# Patient Record
Sex: Male | Born: 1935 | Race: Black or African American | Hispanic: No | Marital: Married | Smoking: Former smoker
Health system: Southern US, Community
[De-identification: ages and names within clinical notes are randomized; demographics above are authoritative.]

## PROBLEM LIST (undated history)

## (undated) DIAGNOSIS — D696 Thrombocytopenia, unspecified: Secondary | ICD-10-CM

## (undated) DIAGNOSIS — M1990 Unspecified osteoarthritis, unspecified site: Secondary | ICD-10-CM

## (undated) DIAGNOSIS — I739 Peripheral vascular disease, unspecified: Secondary | ICD-10-CM

## (undated) DIAGNOSIS — I499 Cardiac arrhythmia, unspecified: Secondary | ICD-10-CM

## (undated) DIAGNOSIS — I10 Essential (primary) hypertension: Secondary | ICD-10-CM

## (undated) DIAGNOSIS — E785 Hyperlipidemia, unspecified: Secondary | ICD-10-CM

## (undated) DIAGNOSIS — I4891 Unspecified atrial fibrillation: Secondary | ICD-10-CM

## (undated) DIAGNOSIS — R5383 Other fatigue: Secondary | ICD-10-CM

## (undated) DIAGNOSIS — D649 Anemia, unspecified: Secondary | ICD-10-CM

## (undated) HISTORY — DX: Peripheral vascular disease, unspecified: I73.9

## (undated) HISTORY — DX: Unspecified atrial fibrillation: I48.91

## (undated) HISTORY — DX: Anemia, unspecified: D64.9

## (undated) HISTORY — PX: COLONOSCOPY: SHX174

## (undated) HISTORY — DX: Thrombocytopenia, unspecified: D69.6

## (undated) HISTORY — PX: NECK SURGERY: SHX720

## (undated) HISTORY — DX: Hyperlipidemia, unspecified: E78.5

## (undated) HISTORY — DX: Unspecified osteoarthritis, unspecified site: M19.90

## (undated) HISTORY — DX: Other fatigue: R53.83

## (undated) HISTORY — PX: BREAST SURGERY: SHX581

---

## 1990-07-23 HISTORY — PX: PROSTATECTOMY: SHX69

## 1997-10-27 ENCOUNTER — Ambulatory Visit (HOSPITAL_COMMUNITY): Admission: RE | Admit: 1997-10-27 | Discharge: 1997-10-27 | Payer: Self-pay | Admitting: Neurosurgery

## 1998-01-05 ENCOUNTER — Emergency Department (HOSPITAL_COMMUNITY): Admission: EM | Admit: 1998-01-05 | Discharge: 1998-01-05 | Payer: Self-pay | Admitting: Emergency Medicine

## 1998-02-21 ENCOUNTER — Ambulatory Visit (HOSPITAL_COMMUNITY): Admission: RE | Admit: 1998-02-21 | Discharge: 1998-02-21 | Payer: Self-pay | Admitting: Urology

## 1998-03-09 ENCOUNTER — Ambulatory Visit (HOSPITAL_COMMUNITY): Admission: RE | Admit: 1998-03-09 | Discharge: 1998-03-09 | Payer: Self-pay | Admitting: Neurosurgery

## 1998-08-22 ENCOUNTER — Emergency Department (HOSPITAL_COMMUNITY): Admission: EM | Admit: 1998-08-22 | Discharge: 1998-08-22 | Payer: Self-pay | Admitting: Emergency Medicine

## 1998-09-21 ENCOUNTER — Encounter: Payer: Self-pay | Admitting: Neurosurgery

## 1998-09-22 ENCOUNTER — Encounter: Payer: Self-pay | Admitting: Neurosurgery

## 1998-09-22 ENCOUNTER — Inpatient Hospital Stay (HOSPITAL_COMMUNITY): Admission: RE | Admit: 1998-09-22 | Discharge: 1998-09-23 | Payer: Self-pay | Admitting: Neurosurgery

## 1998-10-15 ENCOUNTER — Ambulatory Visit (HOSPITAL_COMMUNITY): Admission: RE | Admit: 1998-10-15 | Discharge: 1998-10-15 | Payer: Self-pay | Admitting: Neurosurgery

## 1998-10-15 ENCOUNTER — Encounter: Payer: Self-pay | Admitting: Neurosurgery

## 1998-11-07 ENCOUNTER — Encounter: Payer: Self-pay | Admitting: Neurosurgery

## 1998-11-07 ENCOUNTER — Ambulatory Visit (HOSPITAL_COMMUNITY): Admission: RE | Admit: 1998-11-07 | Discharge: 1998-11-07 | Payer: Self-pay | Admitting: Neurosurgery

## 1999-01-17 ENCOUNTER — Encounter
Admission: RE | Admit: 1999-01-17 | Discharge: 1999-03-09 | Payer: Self-pay | Admitting: Physical Medicine & Rehabilitation

## 1999-06-02 ENCOUNTER — Encounter (INDEPENDENT_AMBULATORY_CARE_PROVIDER_SITE_OTHER): Payer: Self-pay | Admitting: Specialist

## 1999-06-02 ENCOUNTER — Ambulatory Visit (HOSPITAL_COMMUNITY): Admission: RE | Admit: 1999-06-02 | Discharge: 1999-06-02 | Payer: Self-pay | Admitting: Gastroenterology

## 2000-04-18 ENCOUNTER — Encounter: Payer: Self-pay | Admitting: Urology

## 2000-04-18 ENCOUNTER — Ambulatory Visit (HOSPITAL_COMMUNITY): Admission: RE | Admit: 2000-04-18 | Discharge: 2000-04-18 | Payer: Self-pay | Admitting: Urology

## 2000-09-19 ENCOUNTER — Encounter: Payer: Self-pay | Admitting: Emergency Medicine

## 2000-09-19 ENCOUNTER — Inpatient Hospital Stay (HOSPITAL_COMMUNITY): Admission: EM | Admit: 2000-09-19 | Discharge: 2000-09-21 | Payer: Self-pay | Admitting: Emergency Medicine

## 2000-12-18 ENCOUNTER — Ambulatory Visit (HOSPITAL_COMMUNITY): Admission: RE | Admit: 2000-12-18 | Discharge: 2000-12-18 | Payer: Self-pay | Admitting: *Deleted

## 2001-05-22 ENCOUNTER — Encounter: Payer: Self-pay | Admitting: Orthopaedic Surgery

## 2001-05-29 ENCOUNTER — Inpatient Hospital Stay (HOSPITAL_COMMUNITY): Admission: RE | Admit: 2001-05-29 | Discharge: 2001-05-31 | Payer: Self-pay | Admitting: Orthopaedic Surgery

## 2001-05-29 ENCOUNTER — Encounter: Payer: Self-pay | Admitting: Orthopaedic Surgery

## 2001-05-30 ENCOUNTER — Encounter: Payer: Self-pay | Admitting: Orthopaedic Surgery

## 2001-05-31 ENCOUNTER — Encounter: Payer: Self-pay | Admitting: Internal Medicine

## 2001-09-05 ENCOUNTER — Encounter: Admission: RE | Admit: 2001-09-05 | Discharge: 2001-09-05 | Payer: Self-pay | Admitting: Orthopaedic Surgery

## 2001-09-05 ENCOUNTER — Encounter: Payer: Self-pay | Admitting: Orthopaedic Surgery

## 2003-09-23 ENCOUNTER — Encounter: Admission: RE | Admit: 2003-09-23 | Discharge: 2003-09-23 | Payer: Self-pay | Admitting: Internal Medicine

## 2003-11-25 ENCOUNTER — Emergency Department (HOSPITAL_COMMUNITY): Admission: EM | Admit: 2003-11-25 | Discharge: 2003-11-25 | Payer: Self-pay | Admitting: Family Medicine

## 2004-06-27 ENCOUNTER — Encounter: Admission: RE | Admit: 2004-06-27 | Discharge: 2004-06-27 | Payer: Self-pay | Admitting: Internal Medicine

## 2005-12-25 ENCOUNTER — Ambulatory Visit (HOSPITAL_COMMUNITY): Admission: RE | Admit: 2005-12-25 | Discharge: 2005-12-25 | Payer: Self-pay | Admitting: *Deleted

## 2007-01-08 ENCOUNTER — Encounter: Admission: RE | Admit: 2007-01-08 | Discharge: 2007-01-08 | Payer: Self-pay | Admitting: Internal Medicine

## 2010-11-03 ENCOUNTER — Encounter (HOSPITAL_COMMUNITY)
Admission: RE | Admit: 2010-11-03 | Discharge: 2010-11-03 | Disposition: A | Discharge status: Home / Self Care | Payer: Medicare HMO | Source: Ambulatory Visit | Attending: Neurosurgery | Admitting: Neurosurgery

## 2010-11-03 LAB — CBC
MCH: 32 pg (ref 26.0–34.0)
MCHC: 32.9 g/dL (ref 30.0–36.0)
Platelets: 75 10*3/uL — ABNORMAL LOW (ref 150–400)
RBC: 4.47 MIL/uL (ref 4.22–5.81)

## 2010-11-03 LAB — BASIC METABOLIC PANEL
Calcium: 9.3 mg/dL (ref 8.4–10.5)
Chloride: 106 mEq/L (ref 96–112)
Creatinine, Ser: 1.27 mg/dL (ref 0.4–1.5)
GFR calc Af Amer: >60 mL/min (ref >60)
Sodium: 140 mEq/L (ref 135–145)

## 2010-11-03 LAB — SURGICAL PCR SCREEN
MRSA, PCR: NEGATIVE
Staphylococcus aureus: NEGATIVE

## 2010-11-09 ENCOUNTER — Inpatient Hospital Stay (HOSPITAL_COMMUNITY): Admission: RE | Admit: 2010-11-09 | Payer: Medicare HMO | Source: Ambulatory Visit | Admitting: Neurosurgery

## 2010-11-13 HISTORY — PX: OTHER SURGICAL HISTORY: SHX169

## 2010-11-16 ENCOUNTER — Ambulatory Visit (HOSPITAL_COMMUNITY): Payer: Medicare HMO

## 2010-11-16 ENCOUNTER — Encounter (HOSPITAL_COMMUNITY): Payer: Self-pay | Admitting: Radiology

## 2010-11-16 ENCOUNTER — Ambulatory Visit (HOSPITAL_COMMUNITY)
Admission: RE | Admit: 2010-11-16 | Discharge: 2010-11-16 | Disposition: A | Discharge status: Home / Self Care | Payer: Medicare HMO | Source: Ambulatory Visit | Attending: Internal Medicine | Admitting: Internal Medicine

## 2010-11-16 DIAGNOSIS — J189 Pneumonia, unspecified organism: Secondary | ICD-10-CM

## 2010-11-16 DIAGNOSIS — Z01812 Encounter for preprocedural laboratory examination: Secondary | ICD-10-CM | POA: Insufficient documentation

## 2010-11-16 DIAGNOSIS — I10 Essential (primary) hypertension: Secondary | ICD-10-CM | POA: Insufficient documentation

## 2010-11-16 DIAGNOSIS — I4891 Unspecified atrial fibrillation: Secondary | ICD-10-CM | POA: Insufficient documentation

## 2010-11-16 DIAGNOSIS — J90 Pleural effusion, not elsewhere classified: Secondary | ICD-10-CM | POA: Insufficient documentation

## 2010-11-16 DIAGNOSIS — K7689 Other specified diseases of liver: Secondary | ICD-10-CM | POA: Insufficient documentation

## 2010-11-16 DIAGNOSIS — J479 Bronchiectasis, uncomplicated: Secondary | ICD-10-CM | POA: Insufficient documentation

## 2010-11-16 DIAGNOSIS — Z01810 Encounter for preprocedural cardiovascular examination: Secondary | ICD-10-CM | POA: Insufficient documentation

## 2010-11-16 HISTORY — DX: Essential (primary) hypertension: I10

## 2010-11-16 MED ORDER — IOHEXOL 300 MG/ML  SOLN
80.0000 mL | Freq: Once | INTRAMUSCULAR | Status: AC | PRN
  Administered 2010-11-16: 80 mL via INTRAVENOUS

## 2010-11-21 ENCOUNTER — Inpatient Hospital Stay (HOSPITAL_COMMUNITY)
Admission: AD | Admit: 2010-11-21 | Discharge: 2010-11-23 | DRG: 308 | Disposition: A | Discharge status: Home / Self Care | Payer: Medicare HMO | Source: Ambulatory Visit | Attending: Cardiology | Admitting: Cardiology

## 2010-11-21 ENCOUNTER — Inpatient Hospital Stay (HOSPITAL_COMMUNITY): Payer: Medicare HMO

## 2010-11-21 DIAGNOSIS — Z8546 Personal history of malignant neoplasm of prostate: Secondary | ICD-10-CM

## 2010-11-21 DIAGNOSIS — I509 Heart failure, unspecified: Secondary | ICD-10-CM | POA: Diagnosis present

## 2010-11-21 DIAGNOSIS — Z7982 Long term (current) use of aspirin: Secondary | ICD-10-CM

## 2010-11-21 DIAGNOSIS — I5033 Acute on chronic diastolic (congestive) heart failure: Secondary | ICD-10-CM | POA: Diagnosis present

## 2010-11-21 DIAGNOSIS — I10 Essential (primary) hypertension: Secondary | ICD-10-CM | POA: Diagnosis present

## 2010-11-21 DIAGNOSIS — I4891 Unspecified atrial fibrillation: Principal | ICD-10-CM | POA: Diagnosis present

## 2010-11-21 DIAGNOSIS — I4460 Unspecified fascicular block: Secondary | ICD-10-CM | POA: Diagnosis present

## 2010-11-21 DIAGNOSIS — E785 Hyperlipidemia, unspecified: Secondary | ICD-10-CM | POA: Diagnosis present

## 2010-11-21 DIAGNOSIS — Z79899 Other long term (current) drug therapy: Secondary | ICD-10-CM

## 2010-11-21 DIAGNOSIS — M47812 Spondylosis without myelopathy or radiculopathy, cervical region: Secondary | ICD-10-CM | POA: Diagnosis present

## 2010-11-21 LAB — COMPREHENSIVE METABOLIC PANEL
Albumin: 3 g/dL — ABNORMAL LOW (ref 3.5–5.2)
BUN: 15 mg/dL (ref 6–23)
Chloride: 103 mEq/L (ref 96–112)
Creatinine, Ser: 1.11 mg/dL (ref 0.4–1.5)
Total Bilirubin: 1.2 mg/dL (ref 0.3–1.2)
Total Protein: 6.2 g/dL (ref 6.0–8.3)

## 2010-11-21 LAB — DIFFERENTIAL
Eosinophils Absolute: 0.1 10*3/uL (ref 0.0–0.7)
Eosinophils Relative: 2 % (ref 0–5)
Lymphs Abs: 1.1 10*3/uL (ref 0.7–4.0)
Monocytes Absolute: 0.5 10*3/uL (ref 0.1–1.0)
Monocytes Relative: 11 % (ref 3–12)

## 2010-11-21 LAB — CBC
MCH: 32.1 pg (ref 26.0–34.0)
MCHC: 33.4 g/dL (ref 30.0–36.0)
MCV: 96.1 fL (ref 78.0–100.0)
Platelets: 94 10*3/uL — ABNORMAL LOW (ref 150–400)
RDW: 14.3 % (ref 11.5–15.5)

## 2010-11-22 LAB — BASIC METABOLIC PANEL
CO2: 27 mEq/L (ref 19–32)
Calcium: 8.9 mg/dL (ref 8.4–10.5)
GFR calc Af Amer: >60 mL/min (ref >60)
Sodium: 139 mEq/L (ref 135–145)

## 2010-11-22 NOTE — H&P (Signed)
NAME:  [name], [name] NO.:  [id number]  MEDICAL RECORD NO.:  [id number]           PATIENT TYPE:  LOCATION:                                 FACILITY:  PHYSICIAN:  Landry Corporal, MD DATE OF BIRTH:  1933/06/16  DATE OF ADMISSION:  11/21/2010 DATE OF DISCHARGE:                             HISTORY & PHYSICAL   ADMISSION DIAGNOSES: 1. Atrial fibrillation with rapid ventricular rate. 2. New onset diastolic congestive heart failure with significant     edema.  HISTORY OF PRESENT ILLNESS:  Mr. Sissel is a very pleasant 77 year old gentleman who I had the pleasure of meeting for the first time back on November 07, 2010 for preoperative risk assessment with an abnormal EKG who was found to actually be in atrial fibrillation with a relatively well- controlled rate in the high 90s to low 100s.  He has also noted significant dyspnea on exertion and profound edema.  He was started on Xarelto, Lasix with an increased dose of beta blocker and was brought in after 1 week observed for TEE cardioversion which was done by Dr. Rennis Golden back on November 16, 2010 which showed no evidence of any left atrial thrombus.  He was successfully cardioverted, however, there was some concern based on his echocardiogram showing significant left atrial enlargement that he would likely need pharmacological assistance to keep him in normal sinus rhythm.  He unfortunately was unable get his amiodarone prescription filled due to pharmacy being out of medication and he now returns to clinic in atrial fibrillation with rapid rate in the 117 beats a minute.  He has noticed with improvement of his dyspnea but he has continued to have significant edema with the Lasix dose.  He still has dyspnea on exertion but is significantly better after initial oral diuresis but he does have significantly decreased activity tolerance or exercise tolerance.  He denies any chest pain with rest or exertion.  He has  orthopnea but no PND.  No significant lightheadedness, dizziness, or weakness.  No recent illnesses.  No melena or hematochezia.  No nosebleeds.  No recent TIA or amaurosis fugax type symptoms.  He does walk with a cane.  CURRENT MEDICATIONS: 1. Tenormin 75 mg daily. 2. Amlodipine 5 mg daily. 3. Lipitor 20 mg daily. 4. Aspirin 81 mg daily. 5. Lasix 20 mg daily. 6. Xarelto 20 mg daily. 7. Plan to be on amiodarone load, not currently on.  ALLERGIES:  No known drug allergies.  PAST MEDICAL HISTORY: 1. New diagnosis of atrial fibrillation with difficult to control     rate. 2. History of SVT with ablation, maybe 20 years ago. 3. Hypertension. 4. Dyslipidemia based on all medications. 5. Current cardiac evaluation:     a.     Transthoracic echocardiogram on November 13, 2010 showed normal      LV thickness and size.  EF greater than 55%, unable to assess      diastolic function due to atrial fibrillation.  ED time was      estimated 10, but in atrial fibrillation unable to assess.  Left      atrium  was severely dilated as was the right atrium.  LA volume      was 59 mL/m2 for body surface area.  There is no evidence of ASD.      Mild-to-moderate MR.  Moderate pulmonary hypertension with RV      systolic pressures elevated greater than 60 mmHg.     b.     Persantine Myoview on November 13, 2010 showed diaphragmatic      attenuation and we got activity with a small to moderate sized      fixed inferior apical defect, likely scar but no reversible      ischemia. The study was not gated.  Based on lack of wall motion      abnormality on echocardiogram, this is more likely to be artifact.      Following his TEE cardioversion which showed normal LV function as      well with no evidence of thrombus, no significant calcifications      in the aorta.  There was a echolucent region behind the aorta      which was assessed with a chest CT.  Chest CT from November 16, 2010      showed borderline 10-mm  right paratracheal node.  No significant      pericardial fluid but there are left greater than right pleural      effusions that are dependent and consistent with the pleural      effusion with water and nonbloody density.  There is dependent      atelectasis in both lungs adjacent to the pleural effusions, mild      lower lobe bronchiectasis.  There is also diffuse hepatic      steatosis noted.  ADDITIONAL PAST MEDICAL HISTORY: 1. History of stomach problems and history of prostate cancer status     post prostate surgery in 1996. 2. Chronic arthritis and chronic low back pain.  PAST SURGICAL HISTORY:  Spine surgery in 2000 and in 2002.  I was seeing him in preoperative assessment for redo C-spine surgery by Dr. Gerlene Fee.  SOCIAL HISTORY:  He is separated, former truck Hospital doctor, works for [employer].  Has three children, five grandchildren, three great grandchildren, and living alone.  He is a retired Printmaker.  His primary caregiver is now one of his daughters.  He never smoked, never drank, no illicit drug use and but does not get regular exercise due to his arthritis and neurologic problems.  FAMILY HISTORY:  Noncontributory with no evidence of early onset heart disease, stroke, or diabetes or cholesterol he knows.  REVIEW OF SYSTEMS:  He denies any melena, hematochezia, any GI symptoms, nausea, vomiting, constipation, or diarrhea.  No genitourinary problems besides his increased frequency with the Lasix.  No rashes or lesions. No ulcers.  He does have a significant edema as I mentioned above that has improved somewhat but not completely with the oral Lasix.  No claudication.  He walks with a cane but no muscle joint pain that he can tell other than he has got the numbness and neuropathy in his left leg. He has no hot or cold intolerance and has been evaluated with normal TSH.  IMAGING:  ECG today shows atrial fibrillation, rapid  rate of 117 beats per minute, and left anterior fascicular block.  No real change from previous EKG.  PHYSICAL EXAMINATION:  GENERAL:  He is a pleasant gentleman.  He is in no acute distress.  He is alert and oriented  x3 and answers questions appropriately. VITAL SIGNS:  Blood pressure is much better controlled at 120/60 with a heart rate of 117 beats minute.  His weight is down 10 pounds from previous visit at 207.4.  His height is 6 feet 2 inches. HEENT:  He wears large glasses but he is otherwise normocephalic, atraumatic.  Extraocular muscles are intact.  Mucous membranes are moist. NECK:  Supple without lymphadenopathy or thyromegaly.  There are cannon A waves with JVD that is may be at 12 cm of water.  No carotid bruit. HEART:  Regularly irregular and tachycardic.  There is a holosystolic murmur heard at the apex radiating to the axilla.  It is more difficult to hear with a rapid rate but also due to the rapid rate no S3 was auscultatable.  PMI is nondisplaced. LUNGS:  Essentially clear with decreased breath sounds in the bases and dullness to percussion in the bases as well but no rales noted.  There is good air movement throughout the rest of the lung fields. ABDOMEN:  Mild truncal obesity, but soft, nontender, nondistended,normoactive bowel sounds.  I was not able to palpate hepatosplenomegaly. EXTREMITIES:  2+ and equal pulses as best palpated through the edema. No femoral bruit.  He has got at least 3+ pitting edema up to the knees and slightly above the knees.  PREADMISSION LABORATORY DATA: 1. November 14, 2010, CBC with white count of 4.2, H and H 13.0 and 43.5,     and platelets of 83.  INR was 2.89 but on Xarelto difficult to     assess. 2. BMP with a sodium 143, potassium 3.9, chloride 104, bicarb 28,     glucose 91, BUN 19 with a creatinine of 1.26, and calcium 9.1.  TSH     was 2.7, normal.  IMPRESSION: 1. Recurrent paroxysmal atrial fibrillation with extremely  dilated     left atrium.  It would be very difficult to maintain in sinus     rhythm but he is relatively new in atrial fibrillation and we would     like to try to get him out. 2. Most likely diastolic heart failure with some profound edema     worsened by the atrial fibrillation.  He does not seem to be     tolerant of atrial fibrillation.  PLAN: 1. We did successfully get him out of atrial fibrillation but     unfortunately we did not have him amiodarone loaded prior to or     shortly after conversion.  Admit him for IV amiodarone load and     electrocardioversion after IV loading of amiodarone.  Hopefully,     this will be acute an matter of his atrial fibrillation.  He is     anticoagulated on Xarelto.  He did have mild thrombocytopenia which     will need to follow up on.  His platelets were 93 prior to being on     Xarelto.  I am going to continue on Xarelto just for the ease of     use. 2. For his CHF, discontinuing his amlodipine just because it has a     tendency to worsen edema.  I am going to switch him over to     lisinopril 2.5 mg daily.  I am also going to have him take 100 mg     of Tenormin today.  Plan will be to admit him and diurese with     Lasix 40 mg b.i.d. and then potentially  changed to p.o. once his     rate is controlled.  I think he probably has several liters to     diurese off him, probably it will 200 pounds of his dry weight.     Once admitted, we will get him adequately dilated down and diurese     down to a dry weight and get his rate controlled.  The plan will be     to likely discharge if we can keep him out of atrial fibrillation     for a month.  He can then hold his Xarelto for a week prior to     having his C-spine surgery. 3. As far as dyslipidemia, continue to use the Lipitor. 4. He is on Xarelto for DVT prophylaxis, so no additional treatment is     needed.  We will initiate cardiology p.r.n. orders.  We will get     baseline labs of the BMP  as well as CMP and CBC.  I have discussed the planned cardioversion with Dr. Lynnea Ferrier.  We will then likely see him in the hospital on Thursday and Friday.  This appeared to being a short hospitalization.          ______________________________ Landry Corporal, MD     DWH/MEDQ  D:  11/20/2010  T:  11/20/2010  Job:  [id number]  cc:   Reinaldo Meeker, M.D.  Electronically Signed by Bryan Lemma MD on 11/22/2010 10:52:12 PM

## 2010-11-23 LAB — BASIC METABOLIC PANEL
CO2: 30 mEq/L (ref 19–32)
Chloride: 103 mEq/L (ref 96–112)
Creatinine, Ser: 1.37 mg/dL (ref 0.4–1.5)
GFR calc Af Amer: >60 mL/min (ref >60)
Potassium: 3.9 mEq/L (ref 3.5–5.1)
Sodium: 140 mEq/L (ref 135–145)

## 2010-11-23 LAB — CBC
Hemoglobin: 12.9 g/dL — ABNORMAL LOW (ref 13.0–17.0)
MCH: 31.5 pg (ref 26.0–34.0)
RBC: 4.1 MIL/uL — ABNORMAL LOW (ref 4.22–5.81)

## 2010-11-25 NOTE — Op Note (Signed)
  NAME:  [name], [name] NO.:  [id number]  MEDICAL RECORD NO.:  [id number]           PATIENT TYPE:  I  LOCATION:  4737                         FACILITY:  [facility]  PHYSICIAN:  Ritta Slot, MD     DATE OF BIRTH:  03-13-1933  DATE OF PROCEDURE:  11/21/2010 DATE OF DISCHARGE:                              OPERATIVE REPORT   This is a DC cardioversion.  INDICATIONS:  [name] is a 77 year old African American gentleman with known history of diastolic dysfunction decompensated CHF secondary to atrial fibrillation.  He was admitted 2 days ago for an amiodarone load and cardioversion.  He underwent cardioversion last week, maintained sinus rhythm for about 24 hours and now back in atrial fibrillation.  He is now here for re-cardioversion.  General anesthesia by Dr. Chaney Malling, he gave 90 mg of propofol IV general anesthetic after informed consent.  The patient was given anesthesia by Dr. Chaney Malling.  He received 1 synchronized biphasic 150-joule shock, that did not restored sinus rhythm.  He received a second synchronized biphasic 150-joule shock that did restore normal sinus rhythm.  He will continue on with his amiodarone drip and we will get an ECG now and ECG in the a.m.     Ritta Slot, MD     HS/MEDQ  D:  11/22/2010  T:  11/23/2010  Job:  [id number]  Electronically Signed by Ritta Slot MD on 11/25/2010 04:04:29 PM

## 2010-11-27 NOTE — Cardiovascular Report (Signed)
  NAME:  [name], [name] NO.:  [id number]  MEDICAL RECORD NO.:  [id number]           PATIENT TYPE:  O  LOCATION:  [facility]                         FACILITY:  [facility]  PHYSICIAN:  [place] Maxene Byington, MD         DATE OF BIRTH:  19-Feb-1933  DATE OF PROCEDURE:  11/16/2010 DATE OF DISCHARGE:                           CARDIAC CATHETERIZATION   TEE Cardioversion.  After a procedural time-out, Mr. Chanthavong was sedated with 3 mg of Versed and 30 mcg of fentanyl to achieve moderate sedation.  Once moderate sedation was achieved, the echo probe was easily passed into the esophagus and sequential transesophageal images were taken.  The left atrial appendage was well-visualized and there was no evidence of thrombus or smoke in the left atrium.  The left ventricular ejection fraction was preserved and the patient tolerated the procedure well. After the TEE images were obtained, the patient was then sedated per Anesthesia and once adequately sedated received 1 synchronized cardioversion at 150 joules of biphasic.  This resulted in a conversion from atrial fibrillation to a sinus bradycardia at 56.  The recovery from sedation was delayed and the patient was given Romazicon for reversal of the Versed.  Anesthesia was with the patient until he recovered and was awake and alert.  FINDINGS: 1. No left atrial appendage thrombus. 2. Grossly normal LV function. 3. Moderate TR. 4. Mild MR and mild PI. 5. Succcessful cardioversion x 1 shock (150 J biphasic) 6. There was an echo-free space located around the proximal descending     aorta which could be consistent with pleural effusion.  I discussed     this with Dr. Herbie Baltimore and we have decided to obtain a CT scan of     chest to further evaluate. 7. Again, after discussing with Dr. Herbie Baltimore he wished to start Mr.     Daywalt on amiodarone, therefore, I written him to start amiodarone     400 mg p.o. t.i.d. for loading over 2 weeks and then decreased  the     dose to 400 mg b.i.d. 8. Mr. Haro will follow up with Dr. Herbie Baltimore in the office in 1-2     weeks.     [place] Keymora Grillot, MD     CH/MEDQ  D:  11/16/2010  T:  11/17/2010  Job:  [id number]  cc:   Landry Corporal, MD  Electronically Signed by Kirtland Bouchard. Jenisis Harmsen M.D. on 11/27/2010 12:41:07 PM

## 2010-11-28 NOTE — Discharge Summary (Signed)
NAME:  [name], [name] NO.:  [id number]  MEDICAL RECORD NO.:  [id number]           PATIENT TYPE:  I  LOCATION:  4737                         FACILITY:  [facility]  PHYSICIAN:  Landry Corporal, MD DATE OF BIRTH:  1932/11/05  DATE OF ADMISSION:  11/21/2010 DATE OF DISCHARGE:  11/23/2010                              DISCHARGE SUMMARY   DISCHARGE DIAGNOSES: 1. Paroxysmal atrial fibrillation, status post successful direct     cardioversion this admission while on amiodarone and Xarelto. 2. Acute on Chronic Diastolic Heart Failure due to #1 3. Low-risk Myoview in April 2012. 3. Preserved left ventricular function with dilated left atrium. 4. 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  HOSPITAL COURSE:  The patient is a pleasant 77 year old male who was seen in April for atrial fibrillation.  He was cardioverted and sent home with a prescription for amiodarone, but was unable to fill it.  He presented back to the office in recurrent atrial fibrillation, which was symptomatic.  He is admitted now on Nov 21, 2010, for another attempt at cardioversion after IV amiodarone loading.  He also has some acute-on- chronic diastolic heart failure and he will be diuresed.  The patient was admitted to telemetry on Nov 21, 2010, started on IV amiodarone and diuretics.  His pro-BNP was 2228.  He underwent DC cardioversion on Nov 22, 2010.  He has maintained sinus rhythm, sinus bradycardia.  We did cut back on his beta-blocker.  Plan will be for discharge later this afternoon.  He does have apparently significant cervical spine disease and will need surgery.  Dr. Herbie Baltimore will speak with Dr. Gerlene Fee and arrange this as an outpatient.  For now, the plan would be to have him admitted to stop Xarelto for 48 hours and go on heparin and then have a surgery and then start heparin and go back on Xarelto when okay with Dr. Gerlene Fee.  This may all take place next week.  The patient  will see Dr. Herbie Baltimore on the 9th.  He has been told to contact Dr. Trudee Grip office on Monday.  Please see med rec for complete discharge medications.  He is going home on, 1. Aspirin 81 mg a day. 2. Atenolol 25 mg a day. 3. Lipitor 20 mg a day. 4. Xarelto 20 mg a day. 5. Lasix 20 mg twice a day for 2 days then 20 mg a day. 6. Potassium 20 mEq to take with Lasix. 7. Amiodarone 200 mg t.i.d. for 7 days and then 200 mg a day. 8. Prinivil 2.5 mg.  LABORATORY DATA:  White count 5.8, hemoglobin 12.9, hematocrit 39.5, platelets 93,000.  Sodium 140, potassium 3.9, BUN 20, creatinine 1.37, magnesium 1.9.  Liver functions were normal.  DISPOSITION:  The patient was discharged in stable condition.  He is in sinus rhythm.  He will follow up with Dr. Herbie Baltimore on the 9th.  He has been directed to contact Dr. Gerlene Fee for followup as well.     Abelino Derrick, P.A.  I saw my patient on the day of discharge.  He was in NSR and his edema was minimal.  He felt well and ambulated without dyspnea.  He was stable for discharge with plans to follow up with Dr. Gerlene Fee and myself for schduling his upcomming C-spine surgery.      ______________________________ Landry Corporal, MD    LKK/MEDQ  D:  11/23/2010  T:  11/24/2010  Job:  [id number]  cc:   Reinaldo Meeker, M.D.  Electronically Signed by Corine Shelter P.A. on 11/24/2010 05:31:22 PM Electronically Signed by Bryan Lemma MD on 11/28/2010 09:04:35 AM

## 2010-11-29 ENCOUNTER — Inpatient Hospital Stay (HOSPITAL_COMMUNITY)
Admission: AD | Admit: 2010-11-29 | Discharge: 2010-12-02 | DRG: 473 | Discharge status: Against Medical Advice | Payer: Medicare HMO | Source: Ambulatory Visit | Attending: Neurosurgery | Admitting: Neurosurgery

## 2010-11-29 DIAGNOSIS — I4891 Unspecified atrial fibrillation: Secondary | ICD-10-CM | POA: Diagnosis present

## 2010-11-29 DIAGNOSIS — M5000 Cervical disc disorder with myelopathy, unspecified cervical region: Principal | ICD-10-CM | POA: Diagnosis present

## 2010-11-29 LAB — HEPARIN ANTI-XA: Heparin LMW: 0.81 IU/mL

## 2010-11-30 LAB — CBC
MCH: 31.5 pg (ref 26.0–34.0)
MCHC: 32.7 g/dL (ref 30.0–36.0)
MCV: 96.3 fL (ref 78.0–100.0)
Platelets: 82 10*3/uL — ABNORMAL LOW (ref 150–400)
RDW: 13.9 % (ref 11.5–15.5)

## 2010-11-30 LAB — BASIC METABOLIC PANEL
BUN: 17 mg/dL (ref 6–23)
GFR calc non Af Amer: 59 mL/min — ABNORMAL LOW (ref >60)
Glucose, Bld: 87 mg/dL (ref 70–99)
Potassium: 3.9 mEq/L (ref 3.5–5.1)

## 2010-12-01 ENCOUNTER — Inpatient Hospital Stay (HOSPITAL_COMMUNITY): Payer: Medicare HMO

## 2010-12-01 LAB — CBC
HCT: 40.5 % (ref 39.0–52.0)
MCHC: 32.6 g/dL (ref 30.0–36.0)
RDW: 13.6 % (ref 11.5–15.5)

## 2010-12-02 LAB — CBC
MCHC: 33.1 g/dL (ref 30.0–36.0)
MCV: 94 fL (ref 78.0–100.0)
WBC: 5.3 10*3/uL (ref 4.0–10.5)

## 2010-12-02 NOTE — Consult Note (Signed)
NAME:  [name], [name] NO.:  [id number]  MEDICAL RECORD NO.:  [id number]           PATIENT TYPE:  I  LOCATION:  3040                         FACILITY:  [facility]  PHYSICIAN:  Landry Corporal, MD DATE OF BIRTH:  04-Apr-1933  DATE OF CONSULTATION:  11/29/2010 DATE OF DISCHARGE:                                CONSULTATION   ADMITTING PHYSICIAN:  Reinaldo Meeker, MD  PRIMARY CARDIOLOGIST:  Landry Corporal, MD  PRIMARY PHYSICIAN:  Georgianne Fick, MD  REASON FOR CONSULTATION:  Preoperative monitoring for atrial fibrillation and heart failure for a C-spine surgery.  INTERVAL HISTORY:  Briefly, [name] is a 77 year old gentleman who was referred to me for preoperative assessment for urgent C-spine surgery. This is for several spinal stenosis.  When I first saw him, he was in atrial fibrillation with rapid ventricular rate, and was also having heart failure from likely diastolic dysfunction.  I put him on Lasix for diuresis.  He is relative for anticoagulation and referred him for TEE cardioversion with a plan to start amiodarone loading.  Unfortunately, he did not get his amiodarone pill until several days post cardioversion, which was successful initially with no abnormalities noted in TEE as far as any thrombus.  He then remained on Xarelto, but when I saw him back in followup, he was back in atrial fibrillation and still having some heart failure symptoms.  He then admitted in the hospital for intravenous amiodarone loading as well as intravenous Lasix for couple days for diuresis.  He diuresed well, and did very well. Next day, he underwent just standard synchronized cardioversion, and he remains in sinus rhythm since and he was placed on p.o. amiodarone.  He was discharged on next day on Xarelto.  After long discussion with Dr. Gerlene Fee, the decision was made that for this Xarelto, we hold it for a total of 3 days prior to the surgery, but he will need  to be bridged with heparin because of the recent cardioversion, so I think the plan is now for to be admitted today for IV heparin loading and bridging.  He did not take his Xarelto last night.  He has planned his operation on Friday.  He remains in sinus rhythm, but no heart failure symptoms.  REVIEW OF SYSTEMS:  As far as review of systems is essentially negative. There is only mild edema, no lightheadedness, dizziness, or weakness. No recent TIA or amaurosis fugax type symptoms.  No PND or orthopnea. No chest pain or shortness of breath on exertion.  No melena, hematochezia, or hematuria.  No nosebleeds and no claudication. Otherwise, review of system is negative by 10 systems.  PAST MEDICAL HISTORY: 1. New diagnosis of atrial fibrillation with rapid ventricular rate,     status post cardioversion now on amiodarone and the anticoagulation     is Xarelto that is being held for surgery. 2. History of SVT, status post ablation 20 years ago. 3. Hypertension. 4. Preoperative cardiac evaluation also included an echocardiogram     showing normal LV thickness and normal ejection fraction, in determining to left atrial pressure that is  probably mild diastolic dysfunction because it is severely dilated left atrium and right atrium, and moderate mitral regurgitation, and moderate pulmonary hypertension, RV pressures elevated greater than 60 mmHg.  TEE also showed normal ejection fraction. It did show no thrombus and only mild regurgitation was noted in the mitral valve and moderate tricuspid regurgitation noted. 1. Persantine Myoview on November 13, 2010, no reversible ischemia,     mostly likely it is a moderate sized fixed inferior apical defect     likely artifact or scar. 2. Stomach problems. 3. Prostate cancer. 4. Chronic arthritis due to degenerative joint disease.  PAST SURGICAL HISTORY:  C-spine surgery in 2000 and 2002.  SOCIAL HISTORY:  He is separated, former truck Hospital doctor, and he  used work for [employer].  He has 3 children, 5 grandchildren, 3 great grandchildren.  He lives alone and retired.  He never smoked, never drank, no illicit drug use.  He does not get any regular exercise due to his arthritis and cervical spine stenosis problems.  FAMILY HISTORY:  Noncontributory.  ALLERGIES:  No known drug allergies.  MEDICATIONS: 1. Tenormin 75 mg daily. 2. Lipitor 20 mg daily. 3. Aspirin 81 mg daily. 4. Lasix 20 mg daily to be increased 20 b.i.d. on discharge. 5. Xarelto on hold, which is 20 mg daily to be restarted when stable     postoperatively. 6. Amiodarone, he is currently on his loading dose of 200 mg t.i.d.     will be probably reduced to 200 mg b.i.d. during the     hospitalization and then down finally 200 mg once a day. 7. Lisinopril 2.5 mg  daily. 8. Potassium chloride 20 mEq daily.  PHYSICAL EXAMINATION:  GENERAL:  He is a very pleasant, healthy- appearing gentleman.  He is in no acute distress.  He looks much better today. VITAL SIGNS:  Blood pressure 114/90, with the heart rate of 54 and sinus rhythm. His weight is down 5 more pounds from previous visit of 202 pounds, height is 6 feet 2 inches. HEENT:  He wears large glasses, but otherwise normocephalic, atraumatic. Extraocular muscles are intact.  Mucous membranes are moist. NECK:  No significant JVD.  No carotid bruits. HEART:  Regular rate and rhythm with mildly bradycardic.  Normal S1 and S2.  There is a loud holosystolic murmur may be 2/6 heard at the apex radiating to the axilla.  It is also heard at the right sternal border. This may be due to the murmurs.  Nondisplaced PMI and no other rubs or gallops. LUNGS:  Clear to auscultation bilaterally.  No respiratory effort with good air movement.  Nonlabored. ABDOMEN:  Soft, nontender, and nondistended.  Normoactive bowel sounds. There is mild truncal obesity. EXTREMITIES:  No clubbing, cyanosis, or edema.  A 2+ and equal  pulses, posterior tibial, dorsalis pedis may be 1+, but there is only mild 1+ edema up to the ankles.  Radial pulses are 2+.  EKG now shows normal sinus bradycardia, heart rate 54.  There is T-wave inversions in lead III, which was not previously noted, otherwise nonspecific ST-T changes.  IMPRESSION AND RECOMMENDATIONS: 1. Atrial fibrillation, status post cardioversion now sinus rhythm on     amiodarone.  I discussed the plan for him to be admitted tonight on     IV heparin, as a result of his Xarelto especially in this short     window after his cardioversion.  After discussing with Dr. Gerlene Fee,     this could not be postponed  any further, so therefore he is going     to be on IV heparin and then stop prior to surgery and then restart     after surgery  and then once there is no evidence of bleeding     postoperatively. He can switch back to his Xarelto.  He will     continue that for long term.  We will also continue on the current     dose of atenolol and amiodarone.  We will continue to be tapered if     it states the way it until likely he is discharged as to decrease     the risk of perioperative atrial fibrillation. 2. Heart failure symptoms, most likely diastolic in the setting of     atrial fibrillation now.  There is normal sinus rhythm.  He has     been well diuresed.  I think he still has a little edema and     subsequently reasonable to be on 20 b.i.d. Lasix after discharge. 3. Blood pressure is well-controlled on current medications. 4. Dyslipidemia is on Lipitor.  I think we now continue with that     dose.  He is on still on aspirin and I put him on ACE inhibitor for     his heart failure along with beta blocker.  The [facility] team will be happy to monitor along his course.  I do not know especially misses it around on a daily basis, but we will certainly have him on our list to keep an eye on him, watch in the telemetry, especially the postoperative setting.   We will also be available for assisting with discharge planning.  Please do not hesitate to contact our in-house or on-call extenders for any assistance when it comes to discharge planning.          ______________________________ Landry Corporal, MD     DWH/MEDQ  D:  11/29/2010  T:  11/30/2010  Job:  [id number]  cc:   Georgianne Fick, M.D. Reinaldo Meeker, M.D.  Electronically Signed by Bryan Lemma MD on 12/02/2010 11:08:44 AM

## 2010-12-08 NOTE — Op Note (Signed)
[facility]  Patient:    [name], [name] Visit Number: [id number] MRN: [id number]          Service Type: SUR Location: 3W 0372 01 Attending Physician:  Patricia Nettle Dictated by:   Excell Seltzer. Annabell Howells, M.D. Proc. Date: 05/29/01 Admit Date:  05/29/2001   CC:         Patricia Nettle, M.D.                           Operative Report  DIAGNOSIS:  Bladder neck contracture.  OPERATION:  Complex catheter insertion.  SURGEON:  Excell Seltzer. Annabell Howells, M.D.  ANESTHESIA:  General  DRAIN:  A 16 French Coude catheter  COMPLICATIONS:  None.  INDICATIONS:  [name] is a 77 year old black male who is to undergo cervical disc surgery.  He has had a prior radical prostatectomy and the Foley catheter could not be placed after induction of anesthesia.  FINDINGS AND PROCEDURE:  The patient was prepped with Betadine solution.  A 16 French Coude catheter was fitted with a catheter guide and lubricated.  This was then passed into the bladder with some resistance but without significant trauma. Once I felt the catheter was in the bladder, the guide was removed. The balloon was inflated.  The catheter then drained clear urine and was connected to straight drainage.  There were no complications during the procedure. Dictated by:   Excell Seltzer. Annabell Howells, M.D. Attending Physician:  Patricia Nettle. DD:  05/29/01 TD:  05/31/01 Job: [id number]

## 2010-12-08 NOTE — Procedures (Signed)
[facility]  Patient:    [name], [name]                       MRN: [id number] Proc. Date: 12/18/00 Adm. Date:  [id number] Attending:  Sabino Gasser CC:         Lilly Cove, M.D.   Procedure Report  PROCEDURE:  Colonoscopy.  INDICATION FOR PROCEDURE:  Colon polyp.  ANESTHESIA:  Demerol 50, Versed 6 mg.  DESCRIPTION OF PROCEDURE:  With the patient mildly sedated in the left lateral decubitus position, a rectal exam was performed which was unremarkable. Subsequently, the Olympus videoscopic colonoscope was inserted in the rectum and passed under direct vision to the cecum. The cecum was identified by the ileocecal valve and appendiceal orifice both of which were photographed. From this point, the colonoscope was slowly withdrawn taking circumferential views of the entire colonic mucosa, stopping only in the sigmoid colon where mild diverticulosis was seen and photographed. The endoscope was withdrawn all the way to the rectum which appeared normal in direct and retroflexed view. The endoscope was straightened and withdrawn. The patients vital signs and pulse oximeter remained stable. The patient tolerated the procedure well without apparent complications.  FINDINGS:  Rare diverticulum of sigmoid colon otherwise unremarkable colonoscopic examination to the cecum.  PLAN:  Repeat examination in five years. DD:  12/18/00 TD:  12/18/00 Job: [id number]

## 2010-12-08 NOTE — Op Note (Signed)
[facility]  Patient:    [name], [name] Visit Number: [id number] MRN: [id number]          Service Type: SUR Location: 3W 0372 01 Attending Physician:  Patricia Nettle Dictated by:   Patricia Nettle, M.D. Proc. Date: 05/29/01 Admit Date:  05/29/2001 Discharge Date: 05/31/2001                             Operative Report  DATE OF BIRTH:  [birth year]  PREOPERATIVE DIAGNOSES:  1. Cervical spondylitic myelopathy.  2. Status post C5-6, C6-7 anterior cervical decompression and fusion with residual myelomalacia.  3. C4-5 disk herniation.  4. Lumbar spinal stenosis.  5. History of arrhythmia.  POSTOPERATIVE DIAGNOSES:  1. Cervical spondylitic myelopathy.  2. Status post C5-6, C6-7 anterior cervical decompression and fusion with residual myelomalacia.  3. C4-5 disk herniation.  4. Lumbar spinal stenosis.  5. History of arrhythmia.  OPERATION/PROCEDURE:  1. Revision anterior cervical diskectomy and fusion C4-5.  2. Exploration of anterior spinal fusion C5-6.  3. Placement of intervertebral prosthesis using Synthes lordotic allograft.  4. Anterior iliac crest bone graft morcellized.  5. Microsurgical anterior decompression C4-5, replacement and removal of Gardner-Wells tongs.  6. Segmental anterior instrumentation C4-5 using Danek Atlantis plating system.  SURGEON:  Patricia Nettle, M.D.  ASSISTANT:  Artelia Lar[facility]  ESTIMATED BLOOD LOSS:  200 cc.  ANESTHESIA:  General endotracheal.  COMPLICATIONS:  None.  INDICATIONS:  The patient is a 77 year old male who is status post C5-6 and C6-7 anterior cervical diskectomy and fusion by Dr. Gerlene Mckenzie in March, 2000. He has developed progressive signs and symptoms of myelopathy.  MRI scan of the cervical spine showed a large disk herniation at the C4-5 level above the previously fused C5-6, C6-7 segments.  There is residual myelomalacia at the levels of the previous fusion.  The disk herniation  is creating severe spinal stenosis with cord impingement.  MRI of the lumbar spine shows severe lumbar stenosis.  DESCRIPTION OF PROCEDURE:  The patient was properly identified in the holding area as [name] and brought to the operating room.  He underwent an awake, fiberoptic intubation without difficulty.  He was able to move his hands and feet before being put to sleep.  Neurologic monitoring was established and carried out throughout the entire procedure consisting of some somatosensory evoked potentials.  After SSEPs were taken at baseline, positioning was carried out.  Care was taken to avoid hyperextension of the neck.  All bony prominences were padded.  Gardner-Wells tongs were placed and 5 pounds of traction was hung.  The neck and left iliac crest were prepped and draped in the usual sterile fashion.  He was given IV prophylactic IV antibiotics.  A 6 cm skin incision was made from the midline at approximately the level of the thyroid cartilage.  This was carried down sharply through the platysma.  A subplatysmal plane was raised over the sternocleidomastoid.  The interval between the sternocleidomastoid and the strap muscles were identified.  Care was taken to protect the carotid sheath laterally.  Blunt dissection was used to carry the dissection down to the level of the prevertebral fascia.  Extensive scarring was encountered at the level of the spine.  The visceral structures were carefully and bluntly dissected off of the anterior aspect of the spine.  A disk space was identified and a spinal needle placed.  X-ray confirmed the  C4-5 level.  At this point, by using the Bovie the scar tissue was released from the anterior aspect of the spine.  The longus coli muscle was released with care taken to protect the superior C4 disk.  The previously fused C5-6 level was exposed and showed a solid fusion.  Self-retaining retractors were placed under the longus coli muscle.  An  annulotomy was performed using a 15 blade. Pituitary rongeurs and curettes were used to perform a diskectomy.  The uncovertebral joints were identified.  A perforating bur was utilized to remove the inferior and superior third of the vertebral bodies back to the level of the posterior cortex.  Care was taken to stay within the uncovertebral joint to avoid injuring the vertebral artery.  Using the high speed bur the posterior vertebral cortex was taken down.  Using microcurettes and Kerrisons, the PLL was exposed.  There was extruded disk fragment posterior to the vertebral body.  The PLL was taken down and the dura was exposed.  Disk fragments were removed from the canal.  The foramen were decompressed and checked with a nerve hook.  Hemostasis was achieved.  The Caspar pins were placed.  Gentle distraction was applied.  At this point, attention was directed to the left iliac crest.  A 2 cm incision was made just posterior the anterior superior iliac spine. Dissection was carried down to the level of the deep fascia.  The fascia was incised over the iliac crest and a Salzer bone harvester was utilized to collect approximately 1 cc of cancellous bone.  The incision was closed with #1 Vicryl sutures in the deep layers followed by 2-0 Vicryl suture and then staples.  Attention was directed back to the neck.  A Synthes 10-mm, lordotic, allograft prosthesis was then packed with the previously collected iliac crest bone graft.  This was tamped into position.  The weight was removed from the Gardner-Wells tongs.  The Caspar pins were removed.  A Danek Atlantis plate was then sized and placed using fixed angled screws.  Great care was taken to protect the cephalad C3-4 disk level.  The screws were locked into position.  An x-ray was taken and showed acceptable alignment.  The wound was copiously irrigated.  Methylene blue was then injected into the patients esophagus through an NG tube.  There  was no leakage into the wound. Hemostasis was achieved.  The Penrose drain was placed.  The platysma was  closed using 2-0 interrupted Vicryl sutures.  The skin was closed using 3-0 Vicryl interrupted suture followed by Monocryl.  Steri-Strips were placed. The wounds were dressed in a sterile fashion and a hard cervical collar was placed.  The patient was extubated without difficulty.  He was moving his hands and feet and had good strength at the time that he was transferred to the recovery room in stable condition. Dictated by:   Patricia Nettle, M.D. Attending Physician:  Patricia Nettle. DD:  05/29/01 TD:  05/31/01 Job: [id number]

## 2010-12-08 NOTE — Op Note (Signed)
NAME:  BARUC, TUGWELL NO.:  [id number]   MEDICAL RECORD NO.:  [id number]          PATIENT TYPE:  AMB   LOCATION:  ENDO                         FACILITY:  [facility]   PHYSICIAN:  Georgiana Spinner, M.D.    DATE OF BIRTH:  03-Dec-1932   DATE OF PROCEDURE:  12/25/2005  DATE OF DISCHARGE:                                 OPERATIVE REPORT   PROCEDURE:  Colonoscopy.   INDICATIONS:  Colon polyps.   ANESTHESIA:  Demerol 40 mg, Versed 4 mg.   DESCRIPTION OF PROCEDURE:  With the patient mildly sedated in the left  lateral decubitus position, the Olympus videoscopic colonoscope was inserted  into the rectum, passed under direct vision to the cecum, after a normal  rectal examination.  The cecum was identified by the ileocecal valve and  appendiceal orifice, both of which were photographed. From this point, the  colonoscope was slowly withdrawn taking circumferential views of the colonic  mucosa stopping to photograph diverticula seen in the sigmoid colon until we  reached the rectum which appeared normal on direct and showed hemorrhoids on  retroflexed view. The endoscope was straightened and withdrawn.  The  patient's vital signs and pulse oximeter remained stable.  The patient  tolerated procedure well without apparent complication.   FINDINGS:  Internal hemorrhoids, diverticulosis of the sigmoid colon,  otherwise, unremarkable colonoscopic examination to the cecum.   PLAN:  Repeat examination in five years.           ______________________________  Georgiana Spinner, M.D.     GMO/MEDQ  D:  12/25/2005  T:  12/25/2005  Job:  [id number]

## 2010-12-08 NOTE — H&P (Signed)
[facility]  Patient:    [name], [name] Visit Number: [id number] MRN: [id number]          Service Type: Attending:  Patricia Nettle, M.D. Dictated by:   Marcie Bal Troncale, P.A.-C. Adm. Date:  05/29/01   CC:         Lilly Cove, M.D.   History and Physical  DATE OF BIRTH:  April 10, 1933  SOCIAL SECURITY NUMBER:  [id number]  CHIEF COMPLAINT:  Stiffness and weakness bilateral upper and lower extremities.  HISTORY OF PRESENT ILLNESS:  [name] is a 77 year old male who presents with complaints of bilateral weakness and stiffness to his upper and lower extremities. He has started to develop a neurogenic claudication-type symptoms. He has been developing worsening balance, as well. He is having no bowel or bladder dysfunction at this point, however. He has had some urinary urgency and constipation though. He had previously undergone an anterior cervical disk fusion at C5-6 and C6-7 back in March of 2000. He had done well from this surgery. An MRI study was obtained given his newer symptoms, and these showed moderate to large broad-based disk protrusion at C4-5 with prominent spinal stenosis and cord compression. Because of the findings on his clinical and radiographic exams, it was discussed about the benefits of surgical intervention. He has elected to undergo surgery.  REVIEW OF SYSTEMS:  No fever or chills. He denies any headaches, blurred vision, or diplopia. No earache, sore throat, or rhinorrhea. No chest pain, shortness of breath, or cough. No abdominal pain, nausea, vomiting, or diarrhea. He does report a history of constipation. No hematuria or dysuria. No melena or bright red blood per rectum.  PAST MEDICAL HISTORY:  Significant for: 1. Hypertension. 2. History of arrhythmia in 1996 treated with ablation. 3. Hypercholesterolemia. 4. Gastroesophageal reflux. 5. Prostate cancer. 6. He denies a history of coronary artery disease, heart  attacks, strokes,    seizures, lung or kidney disease.  PAST SURGICAL HISTORY:  As per the HPI, as well as lumbar laminotomy and prostate surgery in 1996.  SOCIAL HISTORY:  He is married and has five adult children who are alive and well. Denies alcohol or tobacco use. He is a retired [employer]. His primary medical physician is Lilly Cove, M.D.  FAMILY HISTORY:  Significant for hypertension. Father also had prostate cancer. Mother had arthritis.  ALLERGIES:  No known drug allergies.  CURRENT MEDICATIONS: 1. Lipitor 10 mg q.d. 2. Diovan 80/12.5 q.d. 3. Aspirin 81 mg q.d. 4. Atenolol 50 mg q.d.  PHYSICAL EXAMINATION:  VITAL SIGNS:  He is afebrile, pulse of 56, respiratory rate is 22 and unlabored, blood pressure 122/84.  GENERAL:  This is a well-developed, well-nourished male in no acute distress. She walks with a marked antalgic gait with the assistance of a cane.  NECK:  Supple with no cervical lymphadenopathy.  HEENT:  Head is atraumatic, normocephalic. Pupils are equal, round, and reactive to light. Extraocular movements are grossly intact. Oropharynx is clear without redness, exudate, or lesions.  CHEST:  Clear to auscultation bilaterally with no wheezes or crackles.  HEART:  Regular rate and rhythm with no murmurs, rubs, or gallops.  ABDOMEN:  Soft, nontender, nondistended with no masses. No hepatosplenomegaly.  BREASTS:   Not examined. Not pertinent to present illness.  GENITOURINARY:  Not examined. Not pertinent to present illness.  EXTREMITIES:  Reflexes are symmetric in both upper and lower extremities. Downgoing Babinski. No clonus. Negative Hoffmann bilaterally. He is unable to heel-to-toe walk.  Decreased sensation in the L5 nerve distribution bilaterally.  SKIN:  Intact without rashes or lesions.  IMPRESSION: 1. Spinal stenosis at C4-5. 2. Hypertension. 3. Hypercholesterolemia. 4. Gastroesophageal reflux disease. 5. History of arrhythmia  treated with ablation. 6. Prostate cancer.  PLAN:  The patient will be admitted to [facility] to undergo a revision anterior cervical disk fusion C4-5 by Dr. Noel Gerold on May 29, 2001. We have received a preoperative clearance from Lilly Cove, M.D. who felt the patient should be at low to moderate risk for the upcoming neck surgery. He felt that the patient would tolerate the procedure well but did recommend close postoperative monitoring on telemetry. All questions have been encouraged and answered for the patient. Dictated by:   Marcie Bal Troncale, P.A.-C. Attending:  Patricia Nettle, M.D. DD:  05/22/01 TD:  05/22/01 Job: [id number]

## 2010-12-21 NOTE — Op Note (Signed)
NAME:  NHIA, HEAPHY NO.:  [id number]  MEDICAL RECORD NO.:  [id number]           PATIENT TYPE:  I  LOCATION:  3040                         FACILITY:  [facility]  PHYSICIAN:  Reinaldo Meeker, M.D. DATE OF BIRTH:  July 12, 1933  DATE OF PROCEDURE:  12/01/2010 DATE OF DISCHARGE:                              OPERATIVE REPORT   PREOPERATIVE DIAGNOSIS:  Herniated disk, C3-4 with cervical myelopathy.  POSTOPERATIVE DIAGNOSIS:  Herniated disk, C3-4 with cervical myelopathy.  PROCEDURE:  C3-4 anterior cervical diskectomy with PEEK interbody fusion followed by C3-4 anterior cervical instrumentation.  SURGEON:  Reinaldo Meeker, MD.  ASSISTANT:  Kathaleen Maser. Pool, MD.  PROCEDURE IN DETAIL:  After being placed in the supine position and 10 pounds halter traction, the patient's neck was prepped and draped in the usual sterile fashion.  Preoperative fluoroscopy was used prior to incision to identify the appropriate level.  Transverse incision was made in the right anterior neck, starting at the midline, headed towards the medial aspect of sternocleidomastoid muscle.  The platysma muscle was then incised transversely.  The natural fascial plane between the strap muscles medially and the sternocleidomastoid laterally was identified and followed down to the anterior aspect of the cervical spine.  Longus colli muscle was identified, split in midline, stripped away bilaterally with unipolar coagulation and Key dissector.  Previous anterior cervical plate was then identified.  This was slightly superior to that, it was incised with #15 blade.  Self-retaining retractor was placed for exposure and used remainder of the case.  Using pituitary rongeurs and curettes, approximately 90% of disk material was removed. High-speed drill was used to widen the interspace.  Bony shavings were saved for use later in the case.  At this time, microscope was draped, brought in the field, and used for  remainder of the case.  Using microdissection technique, the remainder of the disk material down the posterior longitudinal ligament was removed.  Ligament was then incised transversely.  Large amounts of herniated disk material pushed on the spinal cord removed, gave excellent decompression of the underlying spinal dura.  Proximal foraminal decompression was carried out bilaterally.  Inspection of the C3-4 nerve roots yielded no further disk material.  At this time, inspection was carried out in all directions for any evidence of residual compression, none could be identified. Large amounts of irrigation carried out.  Any bleeding was controlled with bipolar coagulation and Gelfoam.  Measurements were taken to 7.5 mm.  Anterior cervical cage was chosen.  It was packed with a mixture of a EquivaBone, autologous bone, then impacted in disk space without difficulty.  We then passed screws for anterior instrumentation with two screws in superior direction and one in inferior direction.  All this with fluoroscopy in excellent position.  Final fluoroscopy showed good placement of the screws and interbody device.  Large amounts of irrigation carried out.  Any bleeding was controlled with bipolar coagulation.  Because the patient's need for heparin and history of low platelet count, we left a prevertebral drain in the prevertebral space. We brought it out through a separate stab wound incision.  We then closed the incision with interrupted inverted Vicryl on the platysma muscle, inverted 5-0 PDS in subcu layer, and Steri-Strips on the skin.  Sterile dressing soft collar applied.  The patient was extubated and taken to recovery room in stable condition.          ______________________________ Reinaldo Meeker, M.D.     ROK/MEDQ  D:  12/01/2010  T:  12/02/2010  Job:  [id number]  Electronically Signed by Aliene Beams M.D. on 12/21/2010 05:01:40 PM

## 2011-01-16 ENCOUNTER — Emergency Department (HOSPITAL_COMMUNITY)
Admission: EM | Admit: 2011-01-16 | Discharge: 2011-01-16 | Disposition: A | Discharge status: Home / Self Care | Payer: Medicare HMO | Attending: Emergency Medicine | Admitting: Emergency Medicine

## 2011-01-16 DIAGNOSIS — I4891 Unspecified atrial fibrillation: Secondary | ICD-10-CM | POA: Insufficient documentation

## 2011-01-16 DIAGNOSIS — S01502A Unspecified open wound of oral cavity, initial encounter: Secondary | ICD-10-CM | POA: Insufficient documentation

## 2011-01-16 DIAGNOSIS — Z7982 Long term (current) use of aspirin: Secondary | ICD-10-CM | POA: Insufficient documentation

## 2011-01-16 DIAGNOSIS — R05 Cough: Secondary | ICD-10-CM | POA: Insufficient documentation

## 2011-01-16 DIAGNOSIS — R059 Cough, unspecified: Secondary | ICD-10-CM | POA: Insufficient documentation

## 2011-01-16 DIAGNOSIS — Z79899 Other long term (current) drug therapy: Secondary | ICD-10-CM | POA: Insufficient documentation

## 2011-01-16 DIAGNOSIS — X58XXXA Exposure to other specified factors, initial encounter: Secondary | ICD-10-CM | POA: Insufficient documentation

## 2011-01-16 DIAGNOSIS — I10 Essential (primary) hypertension: Secondary | ICD-10-CM | POA: Insufficient documentation

## 2011-01-16 DIAGNOSIS — Z7901 Long term (current) use of anticoagulants: Secondary | ICD-10-CM | POA: Insufficient documentation

## 2011-01-16 LAB — POCT I-STAT, CHEM 8
BUN: 14 mg/dL (ref 6–23)
Creatinine, Ser: 1.2 mg/dL (ref 0.50–1.35)
Glucose, Bld: 94 mg/dL (ref 70–99)
Potassium: 3.8 mEq/L (ref 3.5–5.1)
Sodium: 144 mEq/L (ref 135–145)

## 2011-01-16 LAB — PROTIME-INR: INR: 2.96 — ABNORMAL HIGH (ref 0.00–1.49)

## 2011-01-18 NOTE — Discharge Summary (Signed)
  NAME:  [name], [name] NO.:  [id number]  MEDICAL RECORD NO.:  [id number]           PATIENT TYPE:  I  LOCATION:  3040                         FACILITY:  [facility]  PHYSICIAN:  Reinaldo Meeker, M.D. DATE OF BIRTH:  1933-03-23  DATE OF ADMISSION:  11/29/2010 DATE OF DISCHARGE:  12/02/2010                              DISCHARGE SUMMARY   PRIMARY DIAGNOSIS:  Cervical myelopathy.  HISTORY:  [name] is a 77 year old gentleman with signs of cervical myelopathy.  MRI scan showed a large disk herniation at C3-4 with spinal cord compression.  Cardiac issues have delayed his surgery, now comes in for his anterior cervical diskectomy.  He had to be treated with anticoagulation prior to surgery, was admitted a few days before for heparinization.  On Dec 01, 2010, he was taken to operating room where he underwent the anterior cervical diskectomy with fusion and plating. He tolerated the procedure well and awoke feeling much better.  The next day, the patient suddenly decided to leave the hospital against medical advice.  The doctor covering was notified.  The patient refused to sign the AMA form.  His daughter actually came and took him home.  He was to restart his anticoagulation.  His case was stable versus admission, but hard to assess due to the fact that the patient left against medical advice.          ______________________________ Reinaldo Meeker, M.D.     ROK/MEDQ  D:  12/21/2010  T:  12/22/2010  Job:  [id number]  Electronically Signed by Aliene Beams M.D. on 01/18/2011 10:34:57 AM

## 2011-07-06 ENCOUNTER — Emergency Department (HOSPITAL_COMMUNITY): Payer: Medicare HMO

## 2011-07-06 ENCOUNTER — Emergency Department (HOSPITAL_COMMUNITY)
Admission: EM | Admit: 2011-07-06 | Discharge: 2011-07-07 | Disposition: A | Discharge status: Home / Self Care | Payer: Medicare HMO | Attending: Emergency Medicine | Admitting: Emergency Medicine

## 2011-07-06 ENCOUNTER — Encounter (HOSPITAL_COMMUNITY): Payer: Self-pay | Admitting: Emergency Medicine

## 2011-07-06 DIAGNOSIS — K5900 Constipation, unspecified: Secondary | ICD-10-CM | POA: Insufficient documentation

## 2011-07-06 DIAGNOSIS — Z79899 Other long term (current) drug therapy: Secondary | ICD-10-CM | POA: Insufficient documentation

## 2011-07-06 DIAGNOSIS — I10 Essential (primary) hypertension: Secondary | ICD-10-CM | POA: Insufficient documentation

## 2011-07-06 DIAGNOSIS — K625 Hemorrhage of anus and rectum: Secondary | ICD-10-CM | POA: Insufficient documentation

## 2011-07-06 NOTE — ED Notes (Signed)
PT. REPORTS CHRONIC CONSTIPATION FOR SEVERAL DAYS WITH RECTAL BLEEDING .

## 2011-07-06 NOTE — ED Provider Notes (Signed)
History     CSN: [id number] Arrival date & time: 07/06/2011  8:23 PM   First MD Initiated Contact with Patient 07/06/11 [id number]      Chief Complaint  Patient presents with  . Constipation    (Consider location/radiation/quality/duration/timing/severity/associated sxs/prior treatment) HPI  Pt complains of constipation for the past 6 days with rectal bleeding. He states that he has been straining but the stool isnt coming out. He states that when this happens he has noticed blood would come into the toilet bowl. Not very much. He denies abdominal pain, distension, nausea, vomiting, diarrhea, SOB, chest pains.  Past Medical History  Diagnosis Date  . Hypertension     Past Surgical History  Procedure Date  . Neck surgery     No family history on file.  History  Substance Use Topics  . Smoking status: Never Smoker   . Smokeless tobacco: Not on file  . Alcohol Use: No      Review of Systems  Allergies  Lisinopril  Home Medications   Current Outpatient Rx  Name Route Sig Dispense Refill  . AMIODARONE HCL 200 MG PO TABS Oral Take 200 mg by mouth daily.      . ATORVASTATIN CALCIUM 20 MG PO TABS Oral Take 20 mg by mouth daily.      . FUROSEMIDE 20 MG PO TABS Oral Take 20 mg by mouth 2 (two) times daily.      Marland Kitchen LISINOPRIL 20 MG PO TABS Oral Take 20 mg by mouth daily.      Marland Kitchen POTASSIUM CHLORIDE CRYS CR 20 MEQ PO TBCR Oral Take 20 mEq by mouth daily.      Marland Kitchen RIVAROXABAN 10 MG PO TABS Oral Take 10 mg by mouth 2 (two) times daily.        BP 114/57  Pulse 64  Temp(Src) 98.1 F (36.7 C) (Oral)  Resp 16  SpO2 96%  Physical Exam  Constitutional: He is oriented to person, place, and time. He appears well-developed and well-nourished.  HENT:  Head: Normocephalic and atraumatic.  Eyes: EOM are normal. Pupils are equal, round, and reactive to light.  Neck: Normal range of motion.  Cardiovascular: Normal rate and regular rhythm.   Pulmonary/Chest: Effort normal and breath  sounds normal.  Abdominal: Soft. Bowel sounds are normal. He exhibits no distension and no mass. There is no tenderness. There is no rebound and no guarding.  Genitourinary: Rectum normal. Rectal exam shows no external hemorrhoid, no fissure and anal tone normal.     Musculoskeletal: Normal range of motion.  Neurological: He is alert and oriented to person, place, and time.  Skin: Skin is warm and dry.    ED Course  Procedures (including critical care time)   Labs Reviewed  POCT OCCULT BLOOD STOOL, DEVICE   Dg Abd Acute W/chest  07/06/2011  *RADIOLOGY REPORT*  Clinical Data: Constipation.  ACUTE ABDOMEN SERIES (ABDOMEN 2 VIEW & CHEST 1 VIEW)  Comparison: CT 01/08/2007  Findings: Heart size is within normal limits.  Negative for heart failure.  Lungs are clear without infiltrate or effusion.  Round well-defined nodular shadows in the right base. One of these projects outside of the chest may represent skin lesions or may be outside the patient.  Negative for bowel obstruction.  Negative for pneumoperitoneum. Mild retained stool in the colon.  No small bowel obstruction is present.  Lumbar spondylosis is present without acute bony abnormality.  IMPRESSION: No acute cardiopulmonary disease.  Constipation without bowel obstruction.  Original  Report Authenticated By: Camelia Phenes, M.D.     No diagnosis found.    MDM  During rectal exam, the rectum was full of loose stool, with some hard stool up rather far. I loosened it up and the pt had a small bowel movement. He said that he is feeling better already. Will send the patient home with an enema to loosen bowels. Pt seen by dr. Manus Gunning.        Dorthula Matas, PA 07/06/11 [phone]

## 2011-07-06 NOTE — ED Notes (Signed)
Patient currently resting quietly in bed; no respiratory or acute distress noted.  Marlon Pel, PA at bedside.  Will continue to monitor.

## 2011-07-06 NOTE — ED Notes (Signed)
Gave report to Tina, RN.

## 2011-07-06 NOTE — ED Notes (Signed)
Patient complaining of constipation for the last 6-7 days; patient states that the straining has started to cause bleeding (bright red blood on tissue paper) -- first occurred this evening around 1700.  Last bowel movement 6 days ago; patient has been taking laxatives for years.  States that he has also been taking a stool softener, but nothing is helping.  Patient denies any history of constipation problems.  Patient also denies abdominal pain, chest pain, shortness of breath, changes in urine, blurred vision, and numbness/tingling in hands/feet.  Upon ascultation, bowel sounds are present in all four quadrants.  Abdomen is soft and nontender to touch.  Patient alert and oriented x4; PERRL present.  Will continue to monitor.

## 2011-07-07 LAB — OCCULT BLOOD, POC DEVICE: Fecal Occult Bld: NEGATIVE

## 2011-07-07 MED ORDER — DISPOSABLE ENEMA 19-7 GM/118ML RE ENEM: 1.0000 | ENEMA | Freq: Once | RECTAL | Status: AC

## 2011-07-07 NOTE — ED Provider Notes (Signed)
Medical screening examination/treatment/procedure(s) were conducted as a shared visit with non-physician practitioner(s) and myself.  I personally evaluated the patient during the encounter  C/o constipation x 6 days.  No vomiting or abdominal pain.  Abdomen soft and nontender.  No impaction.  Glynn Octave, MD 07/07/11 [phone]

## 2011-09-24 ENCOUNTER — Telehealth: Payer: Self-pay | Admitting: Oncology

## 2011-09-24 NOTE — Telephone Encounter (Signed)
called pts home to scheduled appt and his vm is not set up.  will try on 03/05

## 2011-09-25 ENCOUNTER — Telehealth: Payer: Self-pay | Admitting: Oncology

## 2011-09-25 NOTE — Telephone Encounter (Signed)
called pt and scheduled appt for 03/13.  faxed over a letter to Dr. Teena Dunk with appt d/t

## 2011-09-26 ENCOUNTER — Telehealth: Payer: Self-pay | Admitting: Oncology

## 2011-09-26 NOTE — Telephone Encounter (Signed)
Referred by Dr. Welton Flakes Dx- Leukopenia, Thrombocytopenia, Abn CBC

## 2011-09-27 ENCOUNTER — Encounter: Payer: Self-pay | Admitting: Oncology

## 2011-09-27 DIAGNOSIS — M1990 Unspecified osteoarthritis, unspecified site: Secondary | ICD-10-CM | POA: Insufficient documentation

## 2011-09-27 DIAGNOSIS — D696 Thrombocytopenia, unspecified: Secondary | ICD-10-CM | POA: Insufficient documentation

## 2011-09-27 DIAGNOSIS — I739 Peripheral vascular disease, unspecified: Secondary | ICD-10-CM | POA: Insufficient documentation

## 2011-09-27 DIAGNOSIS — I10 Essential (primary) hypertension: Secondary | ICD-10-CM | POA: Insufficient documentation

## 2011-09-27 DIAGNOSIS — I4891 Unspecified atrial fibrillation: Secondary | ICD-10-CM | POA: Insufficient documentation

## 2011-09-27 DIAGNOSIS — D649 Anemia, unspecified: Secondary | ICD-10-CM | POA: Insufficient documentation

## 2011-09-27 DIAGNOSIS — R5383 Other fatigue: Secondary | ICD-10-CM | POA: Insufficient documentation

## 2011-10-03 ENCOUNTER — Ambulatory Visit (HOSPITAL_BASED_OUTPATIENT_CLINIC_OR_DEPARTMENT_OTHER): Payer: Medicare HMO | Admitting: Oncology

## 2011-10-03 ENCOUNTER — Ambulatory Visit: Payer: Medicare HMO

## 2011-10-03 ENCOUNTER — Other Ambulatory Visit (HOSPITAL_BASED_OUTPATIENT_CLINIC_OR_DEPARTMENT_OTHER): Payer: Medicare HMO | Admitting: Lab

## 2011-10-03 ENCOUNTER — Encounter: Payer: Self-pay | Admitting: Oncology

## 2011-10-03 ENCOUNTER — Telehealth: Payer: Self-pay | Admitting: Oncology

## 2011-10-03 VITALS — BP 155/73 | HR 56 | Temp 97.0°F | Ht 71.0 in | Wt 190.6 lb

## 2011-10-03 DIAGNOSIS — M1990 Unspecified osteoarthritis, unspecified site: Secondary | ICD-10-CM

## 2011-10-03 DIAGNOSIS — I4891 Unspecified atrial fibrillation: Secondary | ICD-10-CM

## 2011-10-03 DIAGNOSIS — R5383 Other fatigue: Secondary | ICD-10-CM

## 2011-10-03 DIAGNOSIS — I739 Peripheral vascular disease, unspecified: Secondary | ICD-10-CM

## 2011-10-03 DIAGNOSIS — D649 Anemia, unspecified: Secondary | ICD-10-CM

## 2011-10-03 DIAGNOSIS — I10 Essential (primary) hypertension: Secondary | ICD-10-CM

## 2011-10-03 DIAGNOSIS — D696 Thrombocytopenia, unspecified: Secondary | ICD-10-CM

## 2011-10-03 DIAGNOSIS — E785 Hyperlipidemia, unspecified: Secondary | ICD-10-CM

## 2011-10-03 LAB — CBC & DIFF AND RETIC
Basophils Absolute: 0 10*3/uL (ref 0.0–0.1)
Eosinophils Absolute: 0 10*3/uL (ref 0.0–0.5)
HGB: 12.7 g/dL — ABNORMAL LOW (ref 13.0–17.1)
Immature Retic Fract: 2.5 % — ABNORMAL LOW (ref 3.00–10.60)
MCV: 96 fL (ref 79.3–98.0)
MONO#: 0.3 10*3/uL (ref 0.1–0.9)
NEUT#: 4.5 10*3/uL (ref 1.5–6.5)
RDW: 13.2 % (ref 11.0–14.6)
Retic Ct Abs: 26.33 10*3/uL — ABNORMAL LOW (ref 34.80–93.90)
lymph#: 1 10*3/uL (ref 0.9–3.3)

## 2011-10-03 LAB — MORPHOLOGY: PLT EST: DECREASED

## 2011-10-03 NOTE — Progress Notes (Signed)
[facility] INITIAL HEMATOLOGY CONSULTATION  Referral MD:  Georgianne Fick, MD, MD  Reason for Referral: thrombocytopenia.     HPI:  [name] is a 78 yo man with HTN, HLP, afib.  He has been in [facility].  Previous CBC were provided for my review.  The oldest CBC dated back to 08/25/2009 with WBC 5.4; Hgb 13.2; Plt 92.  On 09/11/2011, WBC 3.8; Hgb 12.9; Plt 90. Given persistent thrombocytopenia, he was kindly referred to the [facility] for evaluation.  [name] presented to the clinic for the first time today with his daughter.  For the past few years, he has been having problem with weakness in the left leg.  He denies any pain in the back, hip, or leg.  However, he does need a cane to ambulate due to stiffness in the left hip.  He was evaluated in the past and was ruled out for CVA, Parkinson.  He has not had any fall from this left hip problem.  He denies any visible source of bleeding.  He denies fatigue, SOB, CP, adenopathy.  Patient denies fatigue, headache, visual changes, confusion, drenching night sweats, palpable lymph node swelling, mucositis, odynophagia, dysphagia, nausea vomiting, jaundice, chest pain, palpitation, shortness of breath, dyspnea on exertion, productive cough, gum bleeding, epistaxis, hematemesis, hemoptysis, abdominal pain, abdominal swelling, early satiety, melena, hematochezia, hematuria, skin rash, spontaneous bleeding, joint swelling, joint pain, heat or cold intolerance, bowel bladder incontinence, back pain, focal motor weakness, paresthesia, depression, suicidal or homocidal ideation, feeling hopelessness.     Past Medical History  Diagnosis Date  . Hypertension   . Anemia   . Thrombocytopenia   . A-fib   . Peripheral vascular disease   . Fatigue   . Osteoarthritis   . Hyperlipidemia   :    Past Surgical History  Procedure Date  . Neck surgery   :   CURRENT MEDS: Current Outpatient Prescriptions  Medication Sig Dispense Refill    . amiodarone (PACERONE) 200 MG tablet Take 200 mg by mouth daily.        Marland Kitchen atorvastatin (LIPITOR) 20 MG tablet Take 20 mg by mouth daily.        . furosemide (LASIX) 20 MG tablet Take 20 mg by mouth daily.       . irbesartan (AVAPRO) 150 MG tablet Take 150 mg by mouth at bedtime.      . potassium chloride SA (K-DUR,KLOR-CON) 20 MEQ tablet Take 20 mEq by mouth daily.        . rivaroxaban (XARELTO) 10 MG TABS tablet Take 10 mg by mouth daily.           Allergies  Allergen Reactions  . Lisinopril     Patient states it causes runny nose, itchiness, and allergy-type symptoms.  :  Family History  Problem Relation Age of Onset  . Alzheimer's disease Mother 58  . Cancer Father 7    prostate cancer  . Cancer Brother     unknown cancer  :  History   Social History  . Marital Status: Legally Separated    Spouse Name: N/A    Number of Children: 2  . Years of Education: N/A   Occupational History  .      retired [employer] architect   Social History Main Topics  . Smoking status: Never Smoker   . Smokeless tobacco: Not on file  . Alcohol Use: No  . Drug Use: No  . Sexually Active: No   Other Topics  Concern  . Not on file   Social History Narrative  . No narrative on file  :  REVIEW OF SYSTEM:  The rest of the 14-point review of sytem was negative.   Exam:  General:  well-nourished in no acute distress.  Eyes:  no scleral icterus.  ENT:  There were no oropharyngeal lesions.  Neck was without thyromegaly.  Lymphatics:  Negative cervical, supraclavicular or axillary adenopathy.  Respiratory: lungs were clear bilaterally without wheezing or crackles.  Cardiovascular:  Irregularly irregular,  S1/S2, without murmur, rub or gallop.  There was no pedal edema.  GI:  abdomen was soft, flat, nontender, nondistended, without organomegaly.  Muscoloskeletal:  no spinal tenderness of palpation of vertebral spine.  Skin exam was without echymosis, petichae.  Neuro exam was nonfocal.  Patient was  able to get on and off exam table without assistance.  Gait was stiff in the left hip.  Patient was alerted and oriented.  Attention was good.   Language was appropriate.  Mood was normal without depression.  Speech was not pressured.  Thought content was not tangential.    LABS:  Lab Results  Component Value Date   WBC 5.3 12/02/2010   HGB 11.9* 01/16/2011   HCT 35.0* 01/16/2011   PLT 98* 12/02/2010   GLUCOSE 94 01/16/2011   ALT 14 11/21/2010   AST 18 ICTERUS AT THIS LEVEL MAY AFFECT RESULT 11/21/2010   NA 144 01/16/2011   K 3.8 01/16/2011   CL 106 01/16/2011   CREATININE 1.20 01/16/2011   BUN 14 01/16/2011   CO2 32 11/30/2010   INR 2.96* 01/16/2011    Blood smear review:   I personally reviewed the patient's peripheral blood smear today.  There was isocytosis.  There was no peripheral blast.  There was no schistocytosis, spherocytosis, target cell, rouleaux formation, tear drop cell.  There were giant platelets but no platelet clumps.     ASSESSMENT AND PLAN:   1.  Afib:  He is on amiodarone.  He is also on Xarelto per cardiology for CVA prophy. 2.  HTN:  Well controlled on Irbesartan per PCP.  3.  HLP:  He's on Lipitor per PCP. 4.  Thrombocytopenia:  Ddx:  Congenital (such as Bernard-Soulier or May-Hegglin but difficult to prove; he has not had bleeding complication) vs medication-induced (Xarelto, simvastatin, amiodarone).  I cannot rule out early MDS.  I have low clinical concern for sequestration/hypersplenism due to lack of finding on exam.  I have low clinical suspicion for autoimmune process due to chronicity of the problem.   Work up:  I sent for iron level; Vit B12/folate; SPEP and light chain given slight anemia to rule out myeloma.  Treatment:  Watchful observation.  I informed him and his daughter of the low risk of hemorrhage with plt >30K.  In the future, if his plt worsens to <80K or he develops pancytopenia, then a diagnostic bone marrow may be considered.   The length of  time of the face-to-face encounter was 30 minutes. More than 50% of time was spent counseling and coordination of care.

## 2011-10-03 NOTE — Telephone Encounter (Signed)
Gv pt appt for [id number]

## 2011-10-05 LAB — FERRITIN: Ferritin: 155 ng/mL (ref 22–322)

## 2011-10-05 LAB — PROTEIN ELECTROPHORESIS, SERUM
Alpha-2-Globulin: 10.3 % (ref 7.1–11.8)
Beta 2: 6.1 % (ref 3.2–6.5)
Beta Globulin: 5.2 % (ref 4.7–7.2)
Gamma Globulin: 20.3 % — ABNORMAL HIGH (ref 11.1–18.8)

## 2011-10-05 LAB — IRON AND TIBC
Iron: 88 ug/dL (ref 42–165)
UIBC: 123 ug/dL — ABNORMAL LOW (ref 125–400)

## 2011-10-05 LAB — KAPPA/LAMBDA LIGHT CHAINS
Kappa free light chain: 2.5 mg/dL — ABNORMAL HIGH (ref 0.33–1.94)
Kappa:Lambda Ratio: 1.25 (ref 0.26–1.65)
Lambda Free Lght Chn: 2 mg/dL (ref 0.57–2.63)

## 2011-10-05 LAB — VITAMIN B12: Vitamin B-12: 552 pg/mL (ref 211–911)

## 2012-01-06 ENCOUNTER — Emergency Department (HOSPITAL_COMMUNITY)
Admission: EM | Admit: 2012-01-06 | Discharge: 2012-01-06 | Disposition: A | Discharge status: Home / Self Care | Payer: Medicare HMO | Attending: Emergency Medicine | Admitting: Emergency Medicine

## 2012-01-06 ENCOUNTER — Emergency Department (HOSPITAL_COMMUNITY): Payer: Medicare HMO

## 2012-01-06 ENCOUNTER — Encounter (HOSPITAL_COMMUNITY): Payer: Self-pay | Admitting: Family Medicine

## 2012-01-06 DIAGNOSIS — I10 Essential (primary) hypertension: Secondary | ICD-10-CM | POA: Insufficient documentation

## 2012-01-06 DIAGNOSIS — I4891 Unspecified atrial fibrillation: Secondary | ICD-10-CM | POA: Insufficient documentation

## 2012-01-06 DIAGNOSIS — E785 Hyperlipidemia, unspecified: Secondary | ICD-10-CM | POA: Insufficient documentation

## 2012-01-06 DIAGNOSIS — D649 Anemia, unspecified: Secondary | ICD-10-CM | POA: Insufficient documentation

## 2012-01-06 DIAGNOSIS — M1990 Unspecified osteoarthritis, unspecified site: Secondary | ICD-10-CM | POA: Insufficient documentation

## 2012-01-06 DIAGNOSIS — Z711 Person with feared health complaint in whom no diagnosis is made: Secondary | ICD-10-CM

## 2012-01-06 DIAGNOSIS — R222 Localized swelling, mass and lump, trunk: Secondary | ICD-10-CM | POA: Insufficient documentation

## 2012-01-06 DIAGNOSIS — R0602 Shortness of breath: Secondary | ICD-10-CM | POA: Insufficient documentation

## 2012-01-06 IMAGING — CT CT CHEST W/ CM
2 of 4 series · 15 of 36 positions shown, 18 images · IV contrast (APPLIED)
Comparison: Abdominal CT 01/08/2007.  No recent chest radiographs.

CLINICAL DATA: Recent cardioversion with pleural effusions.
Evaluate aorta.  History of atrial fibrillation and hypertension.

CT CHEST WITH CONTRAST
TECHNIQUE: Multidetector CT imaging of the chest was performed
following the standard protocol during bolus administration of
intravenous contrast.
Contrast: 80 ml [id number] intravenously.

[Series 2: routine chest 5.0 st · axial · 0.67mm/px · z∈[-300,-24]mm · 12 of 61 slices shown, 15 images]
[im 3/61  mediastinal]
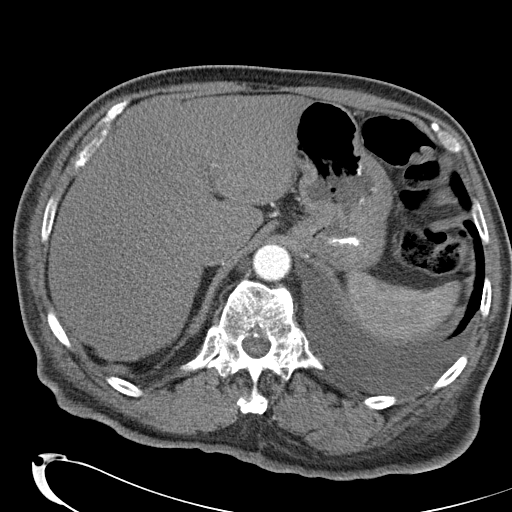
[im 3/61  lung]
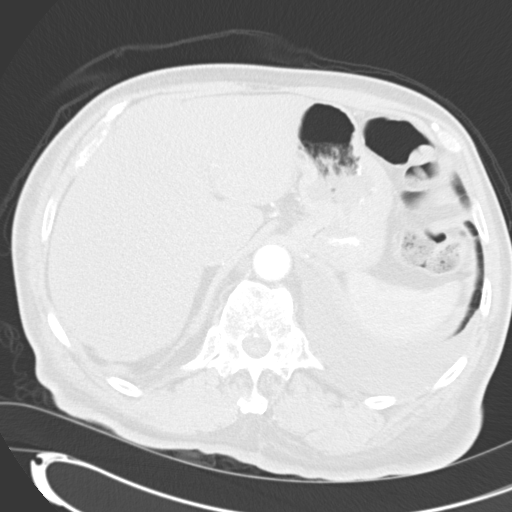
[im 9/61  lung]
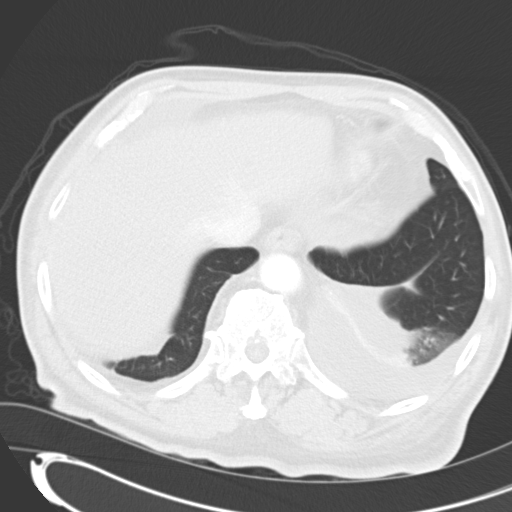
[im 15/61  lung]
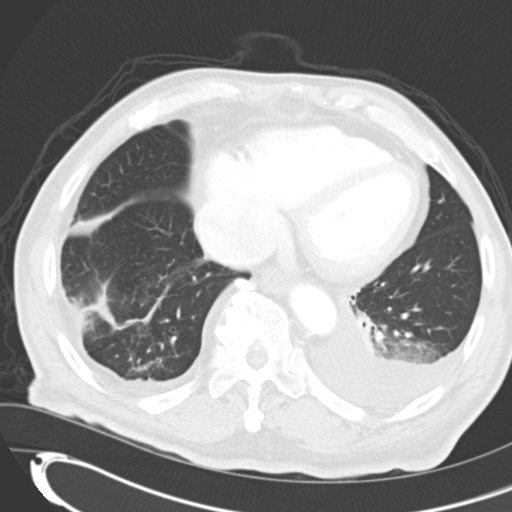
[im 18/61  lung]
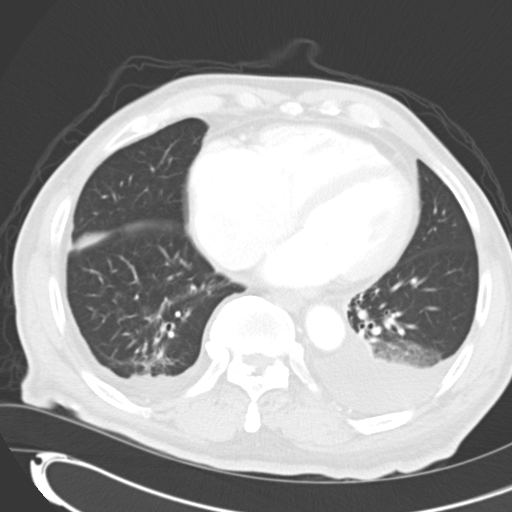
[im 23/61  mediastinal]
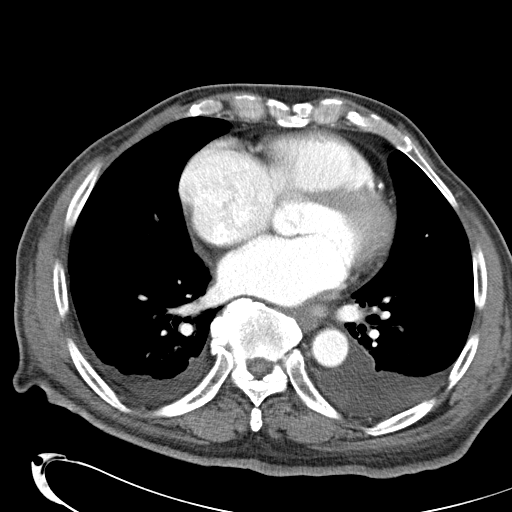
[im 23/61  lung]
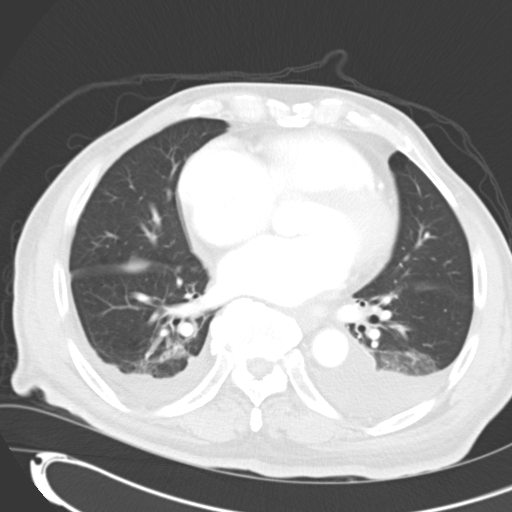
[im 29/61  lung]
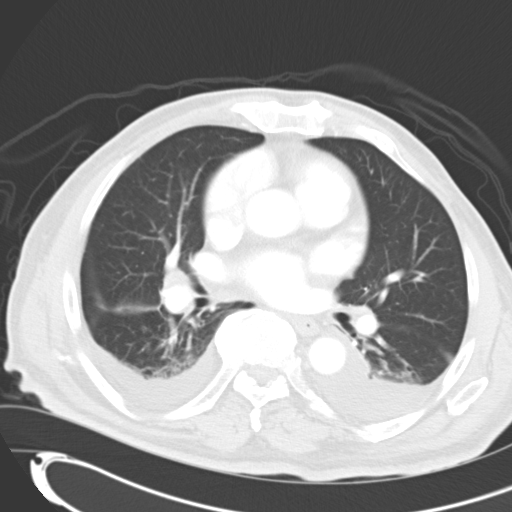
[im 32/61  lung]
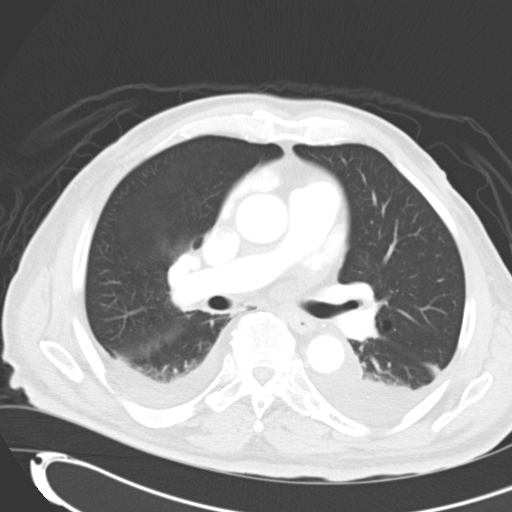
[im 38/61  lung]
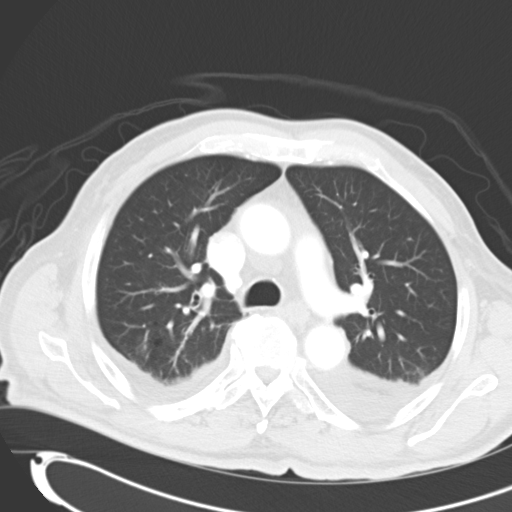
[im 43/61  mediastinal]
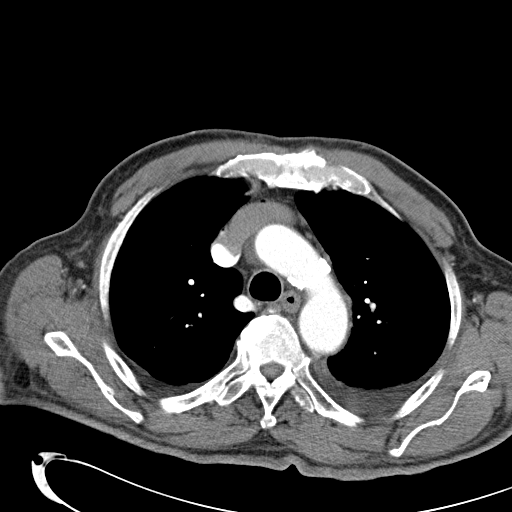
[im 43/61  lung]
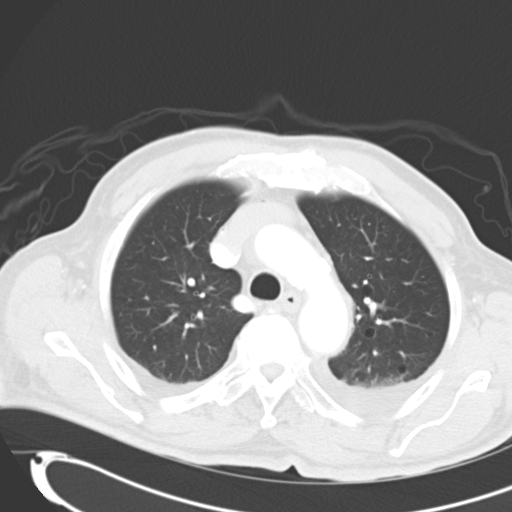
[im 46/61  lung]
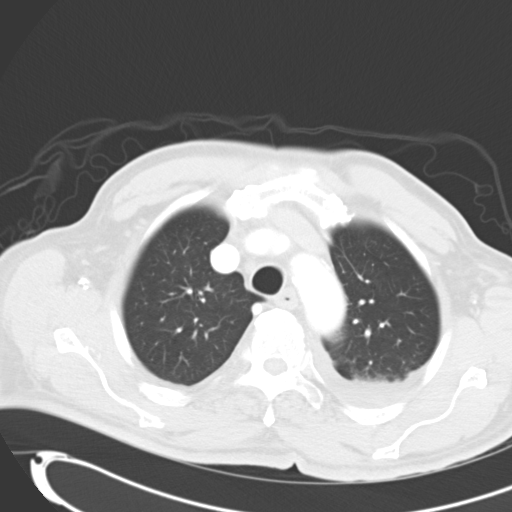
[im 52/61  lung]
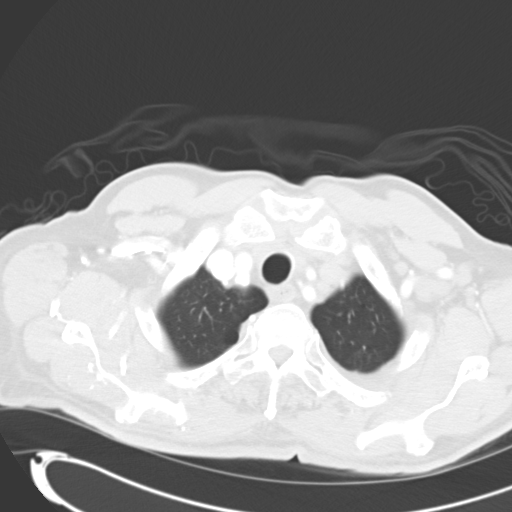
[im 58/61  lung]
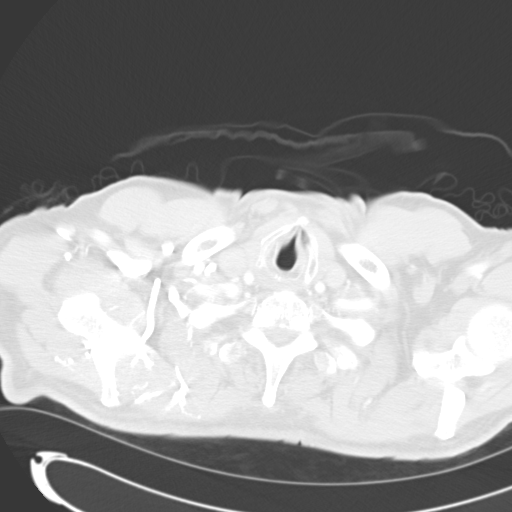

[Series 602: cor · coronal · 0.67mm/px · 3 of 116 slices shown]
[im 24/116  lung]
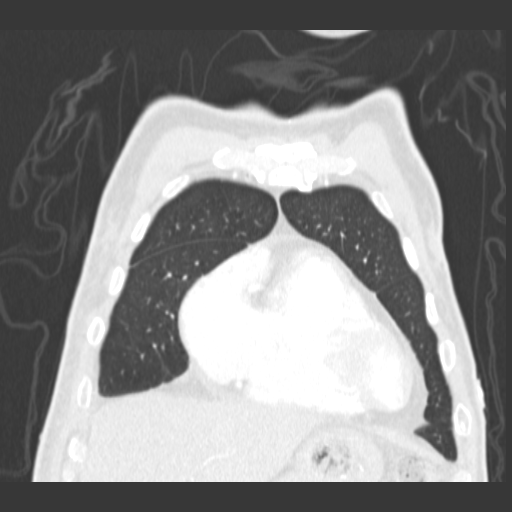
[im 47/116  lung]
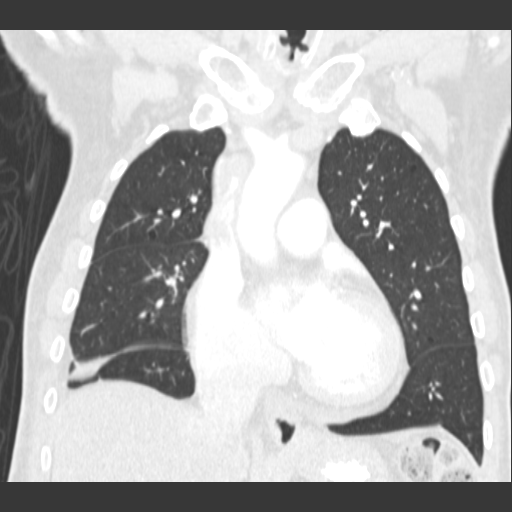
[im 70/116  lung]
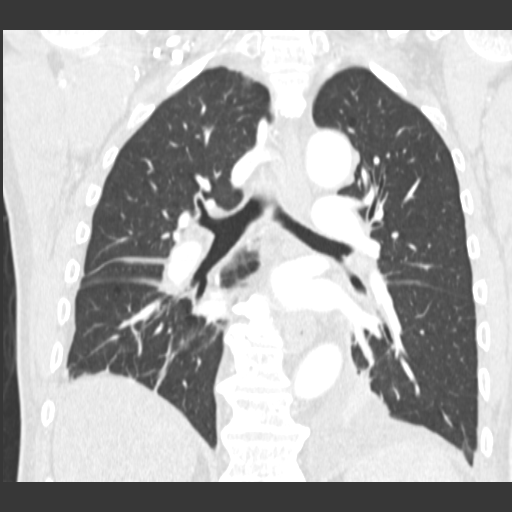

[15 of 36 positions shown; findings below may reference images not displayed]

FINDINGS: The thoracic aorta is mildly tortuous, but is normal in
caliber without intimal irregularity.  There is mild
atherosclerosis in the arch.  The left vertebral artery arises
directly from the arch.  The brachiocephalic and left common
carotid arteries share a common origin from the arch. The right
cardiac chambers are mildly enlarged.  There is no evidence of
periaortic or mediastinal hematoma.  There is typical mixing of
blood and contrast in the superior vena cava.

There are no enlarged mediastinal or hilar lymph nodes.  There is a
borderline 10 mm right paratracheal node on image 22.  There is no
significant pericardial fluid.  There are left greater than right
pleural effusions which are dependent and measure water density.

There is dependent atelectasis in both lungs adjacent to the
pleural effusions.  Mild lower lobe bronchiectasis is present
bilaterally.  There is no confluent airspace opacity or
endobronchial nodule.

Diffuse hepatic steatosis and thoracic paraspinal osteophytes are
noted.
IMPRESSION: 1.  Mild aortic tortuosity and atherosclerosis.  No evidence of
aneurysm, dissection or periaortic hemorrhage.
2.  Simple appearing left greater than right pleural effusions.
3.  Dependent atelectasis and mild lower lobe bronchiectasis in
both lungs.
4.  Single borderline right paratracheal lymph node.

## 2012-01-06 NOTE — ED Notes (Signed)
Pt sts for a few months he has been having problems with one side of his chest swelling intermittently. Denies pain. sts associated with some SOB.

## 2012-01-06 NOTE — ED Notes (Signed)
Pt. C/o left sided chest swelling for the past month. Hard mass on left lower chest. Non-tender to touch. Denies injury. Pt. Reports slight difficulty breathing. No respiratory distress noted. Pt. Resting comfortably in bed, family at bedside.

## 2012-01-06 NOTE — ED Provider Notes (Signed)
History     CSN: [id number]  Arrival date & time 01/06/12  1007   First MD Initiated Contact with Patient 01/06/12 1221      Chief Complaint  Patient presents with  . Shortness of Breath    chest swelling    (Consider location/radiation/quality/duration/timing/severity/associated sxs/prior treatment) Patient is a 78 y.o. male presenting with shortness of breath. The history is provided by the patient.  Shortness of Breath  Associated symptoms include shortness of breath. Pertinent negatives include no chest pain.   patient states his been having episodes of his chest swelling in the morning. States the lower part of his chest gets bigger in the morning most days. Occasional trouble breathing with it. No chest pain. No fevers. He states it also may be that his abdomen is getting smaller when he lays down. No cough. He states the swelling lasts sometimes a few hours. He states he has followup scheduled with his doctor and cardiologist.  Past Medical History  Diagnosis Date  . Hypertension   . Anemia   . Thrombocytopenia   . A-fib   . Peripheral vascular disease   . Fatigue   . Osteoarthritis   . Hyperlipidemia     Past Surgical History  Procedure Date  . Neck surgery     Family History  Problem Relation Age of Onset  . Alzheimer's disease Mother 58  . Cancer Father 61    prostate cancer  . Cancer Brother     unknown cancer    History  Substance Use Topics  . Smoking status: Never Smoker   . Smokeless tobacco: Not on file  . Alcohol Use: No      Review of Systems  Constitutional: Negative for activity change and appetite change.  HENT: Negative for neck stiffness.   Eyes: Negative for pain.  Respiratory: Positive for shortness of breath. Negative for chest tightness.   Cardiovascular: Negative for chest pain and leg swelling.  Gastrointestinal: Negative for nausea, vomiting, abdominal pain and diarrhea.  Genitourinary: Negative for flank pain.    Musculoskeletal: Negative for back pain.  Skin: Negative for rash.  Neurological: Negative for weakness, numbness and headaches.  Psychiatric/Behavioral: Negative for behavioral problems.  All other systems reviewed and are negative.    Allergies  Lisinopril  Home Medications   Current Outpatient Rx  Name Route Sig Dispense Refill  . AMIODARONE HCL 200 MG PO TABS Oral Take 200 mg by mouth daily.      . ATORVASTATIN CALCIUM 20 MG PO TABS Oral Take 20 mg by mouth daily.      . FUROSEMIDE 20 MG PO TABS Oral Take 20 mg by mouth daily.     . IRBESARTAN 150 MG PO TABS Oral Take 150 mg by mouth at bedtime.    Marland Kitchen POTASSIUM CHLORIDE CRYS ER 20 MEQ PO TBCR Oral Take 20 mEq by mouth daily.      Marland Kitchen RIVAROXABAN 10 MG PO TABS Oral Take 10 mg by mouth daily.       BP 152/71  Pulse 53  Temp 97.6 F (36.4 C) (Oral)  Resp 18  SpO2 99%  Physical Exam  Nursing note and vitals reviewed. Constitutional: He is oriented to person, place, and time. He appears well-developed and well-nourished.  HENT:  Head: Normocephalic and atraumatic.  Eyes: EOM are normal. Pupils are equal, round, and reactive to light.  Cardiovascular: Normal rate, regular rhythm and normal heart sounds.   No murmur heard. Pulmonary/Chest: Effort normal and breath sounds  normal.  Abdominal: Soft. Bowel sounds are normal. He exhibits no distension and no mass. There is no tenderness. There is no rebound and no guarding.  Musculoskeletal: Normal range of motion. He exhibits edema.       Trace bilateral lower extremity pitting edema  Neurological: He is alert and oriented to person, place, and time. No cranial nerve deficit.  Skin: Skin is warm and dry.  Psychiatric: He has a normal mood and affect.    ED Course  Procedures (including critical care time)  Labs Reviewed - No data to display Dg Chest 2 View  01/06/2012  *RADIOLOGY REPORT*  Clinical Data: Shortness of breath, cough and congestion.  CHEST - 2 VIEW   Comparison: Chest x-ray 11/21/2010.  Findings: Chronic elevation of the right hemidiaphragm with blunting of the right costophrenic sulcus is unchanged.  Minimal blunting of the left costophrenic sulcus is also noted.  No acute consolidative airspace disease.  Pulmonary vasculature and the cardiomediastinal silhouette are within normal limits. Atherosclerotic calcifications are noted within the arch of the aorta.  IMPRESSION: 1.  No definite radiographic evidence of acute cardiopulmonary disease. 2.  Blunting of the costophrenic sulci bilaterally is similar to prior examinations, favored to reflect chronic pleural scarring (small pleural effusions would be difficult to entirely exclude, but are not strongly favored).  There is also chronic mild elevation of the right hemidiaphragm. 3.  Atherosclerosis.  Original Report Authenticated By: Florencia Reasons, M.D.     No diagnosis found.    MDM  Patient complaining of lower chest swelling. Also states abdomen could be getting smaller. X-ray is stable. Nontender. There is no chest pain. I doubt severe cardiac or intrathoracic cause of this. He has followup with his doctors will be discharged        Juliet Rude. Rubin Payor, MD 01/06/12 1244

## 2012-01-11 IMAGING — CR DG CHEST 2V
2 series · 2 of 2 positions shown · non-contrast
Comparison: CT chest dated 11/16/2010

CLINICAL DATA: CHF.

CHEST - 2 VIEW

[w chest pa]
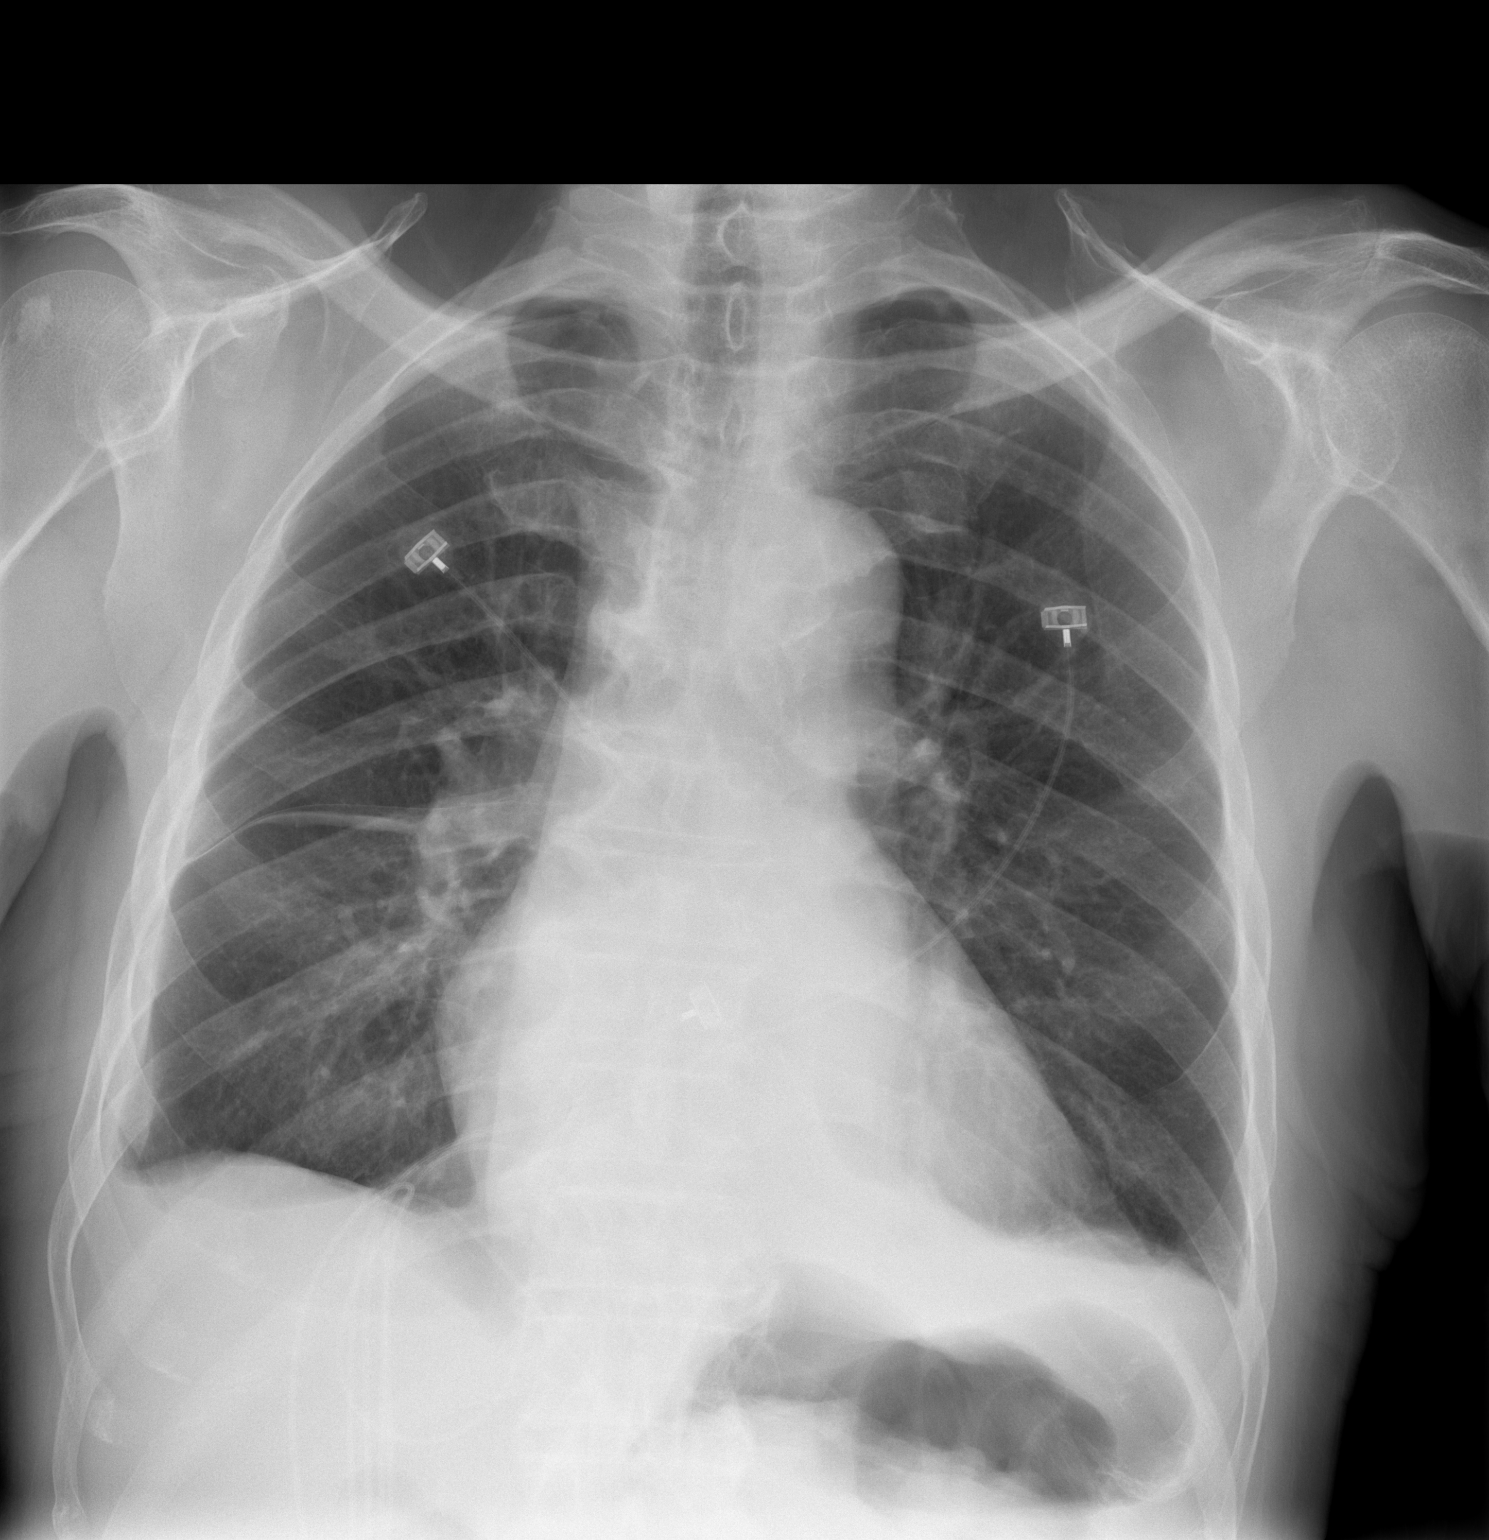

[w chest lat]
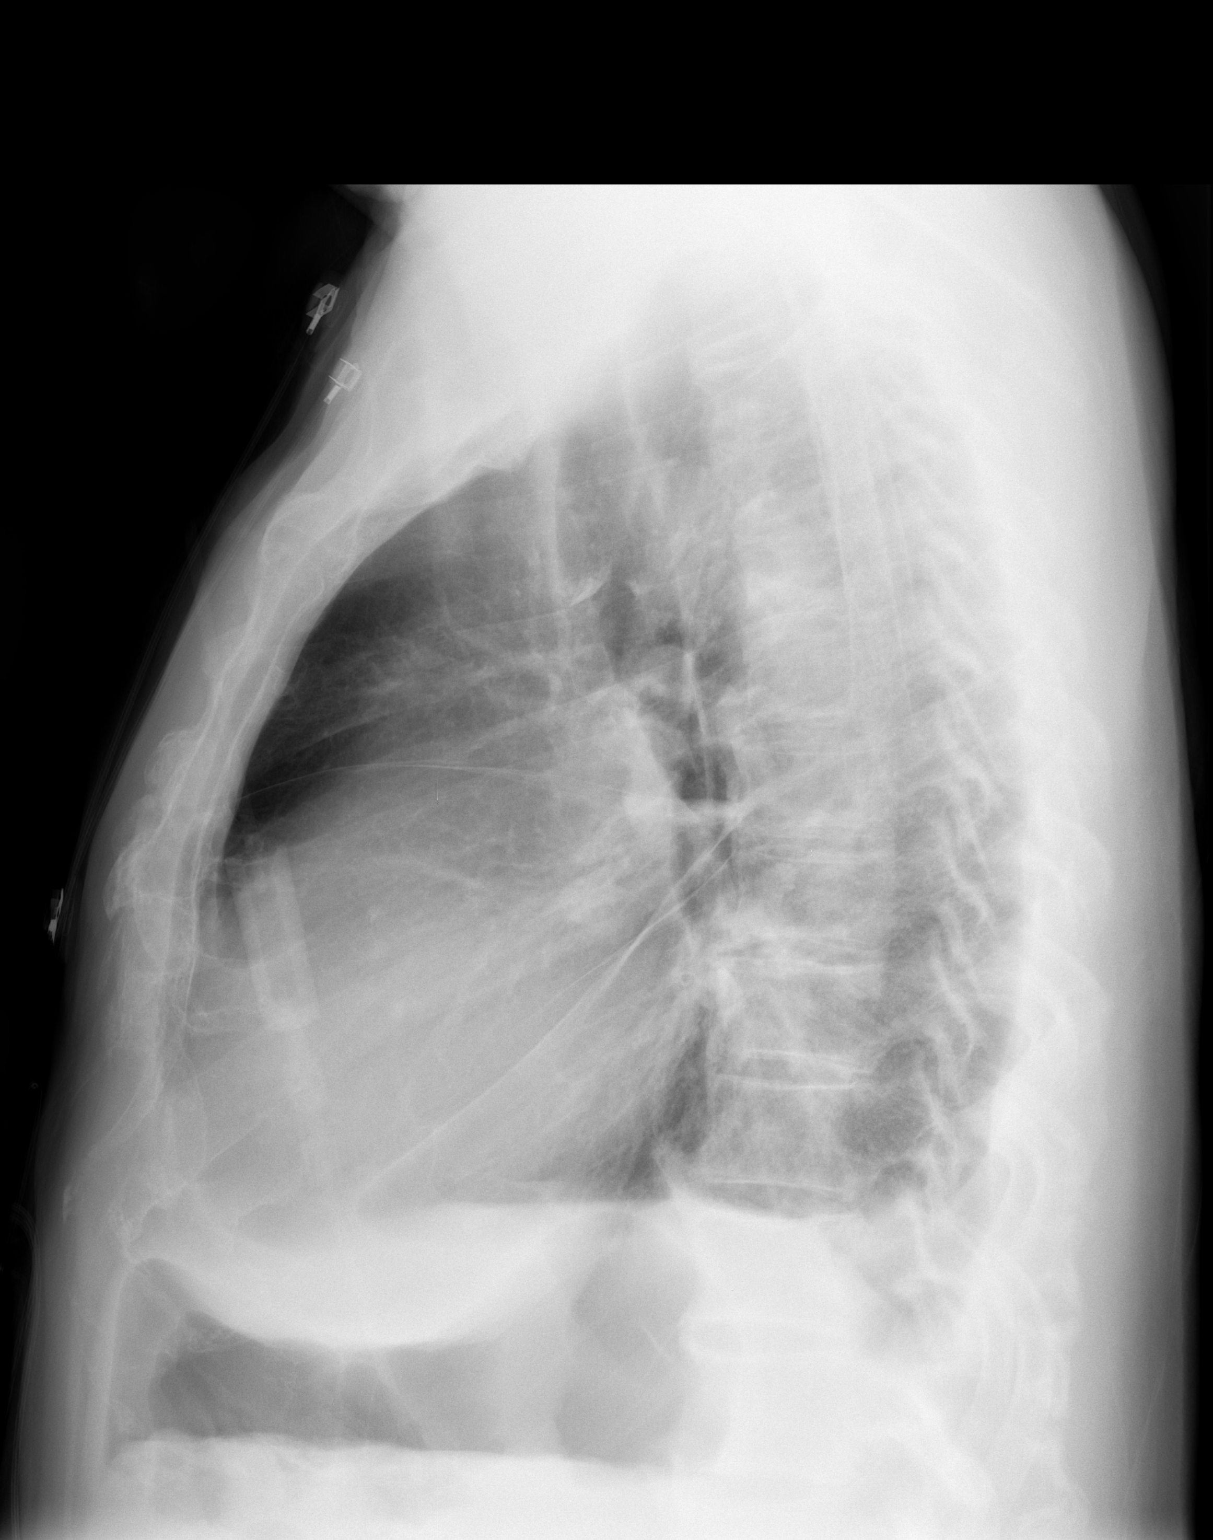

[2 of 2 positions shown; findings below may reference images not displayed]

FINDINGS: Small pleural effusions present bilaterally.  No overt
edema.  Heart is enlarged.
IMPRESSION: Small pleural effusions without pulmonary edema.

## 2012-01-21 IMAGING — RF DG CERVICAL SPINE 1V
1 series · 1 of 1 positions shown · non-contrast
Comparison: None

CLINICAL DATA: Cervical fusion.

CERVICAL SPINE - 1 VIEW

[Series 1: run · 1 of 1 slices shown]
[im 1/1]
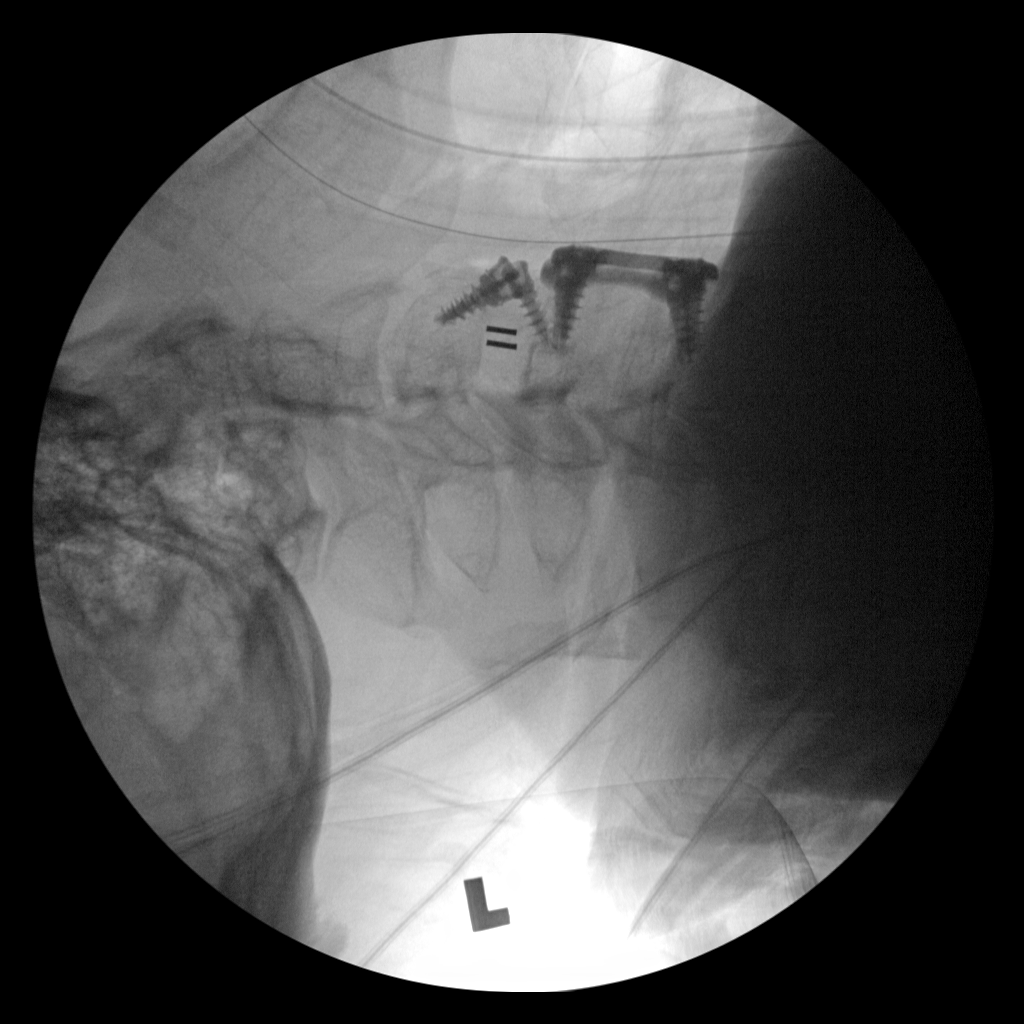

[1 of 1 positions shown; findings below may reference images not displayed]

FINDINGS: Lateral cervical spine film demonstrates a remote
cervical fusion hardware at C4-5.  New anterior and interbody
fusion changes at C3-4.
IMPRESSION: New anterior and interbody fusion changes at C3-4

## 2012-01-31 ENCOUNTER — Other Ambulatory Visit: Payer: Medicare HMO

## 2012-01-31 ENCOUNTER — Telehealth: Payer: Self-pay | Admitting: *Deleted

## 2012-01-31 NOTE — Telephone Encounter (Signed)
Call from pt's daughter regarding lab appt today.  She confirmed time and will notify pt and call back to r/s if pt can't make it today.

## 2012-02-01 ENCOUNTER — Ambulatory Visit (HOSPITAL_BASED_OUTPATIENT_CLINIC_OR_DEPARTMENT_OTHER): Payer: Medicare HMO | Admitting: Lab

## 2012-02-01 DIAGNOSIS — M1990 Unspecified osteoarthritis, unspecified site: Secondary | ICD-10-CM

## 2012-02-01 DIAGNOSIS — I10 Essential (primary) hypertension: Secondary | ICD-10-CM

## 2012-02-01 DIAGNOSIS — R5383 Other fatigue: Secondary | ICD-10-CM

## 2012-02-01 DIAGNOSIS — I739 Peripheral vascular disease, unspecified: Secondary | ICD-10-CM

## 2012-02-01 DIAGNOSIS — I4891 Unspecified atrial fibrillation: Secondary | ICD-10-CM

## 2012-02-01 DIAGNOSIS — D649 Anemia, unspecified: Secondary | ICD-10-CM

## 2012-02-01 DIAGNOSIS — D696 Thrombocytopenia, unspecified: Secondary | ICD-10-CM

## 2012-02-01 LAB — CBC WITH DIFFERENTIAL/PLATELET
BASO%: 0.4 % (ref 0.0–2.0)
EOS%: 0.9 % (ref 0.0–7.0)
MCH: 31.8 pg (ref 27.2–33.4)
MCHC: 33.2 g/dL (ref 32.0–36.0)
MONO%: 7.2 % (ref 0.0–14.0)
RDW: 14 % (ref 11.0–14.6)
lymph#: 0.9 10*3/uL (ref 0.9–3.3)

## 2012-02-18 ENCOUNTER — Encounter (HOSPITAL_COMMUNITY): Payer: Self-pay

## 2012-02-18 ENCOUNTER — Emergency Department (HOSPITAL_COMMUNITY): Payer: Medicare HMO

## 2012-02-18 ENCOUNTER — Inpatient Hospital Stay (HOSPITAL_COMMUNITY): Payer: Medicare HMO

## 2012-02-18 ENCOUNTER — Inpatient Hospital Stay (HOSPITAL_COMMUNITY)
Admission: EM | Admit: 2012-02-18 | Discharge: 2012-02-25 | DRG: 963 | Disposition: A | Discharge status: Skilled Nursing Facility | Payer: Medicare HMO | Attending: Internal Medicine | Admitting: Internal Medicine

## 2012-02-18 DIAGNOSIS — W06XXXA Fall from bed, initial encounter: Secondary | ICD-10-CM | POA: Diagnosis present

## 2012-02-18 DIAGNOSIS — S7291XA Unspecified fracture of right femur, initial encounter for closed fracture: Secondary | ICD-10-CM

## 2012-02-18 DIAGNOSIS — D689 Coagulation defect, unspecified: Secondary | ICD-10-CM

## 2012-02-18 DIAGNOSIS — S7290XA Unspecified fracture of unspecified femur, initial encounter for closed fracture: Secondary | ICD-10-CM

## 2012-02-18 DIAGNOSIS — G936 Cerebral edema: Secondary | ICD-10-CM | POA: Diagnosis present

## 2012-02-18 DIAGNOSIS — D72829 Elevated white blood cell count, unspecified: Secondary | ICD-10-CM | POA: Diagnosis present

## 2012-02-18 DIAGNOSIS — E785 Hyperlipidemia, unspecified: Secondary | ICD-10-CM | POA: Diagnosis present

## 2012-02-18 DIAGNOSIS — I619 Nontraumatic intracerebral hemorrhage, unspecified: Secondary | ICD-10-CM | POA: Diagnosis present

## 2012-02-18 DIAGNOSIS — M25551 Pain in right hip: Secondary | ICD-10-CM | POA: Diagnosis present

## 2012-02-18 DIAGNOSIS — S72109A Unspecified trochanteric fracture of unspecified femur, initial encounter for closed fracture: Secondary | ICD-10-CM | POA: Diagnosis present

## 2012-02-18 DIAGNOSIS — M6282 Rhabdomyolysis: Secondary | ICD-10-CM

## 2012-02-18 DIAGNOSIS — D649 Anemia, unspecified: Secondary | ICD-10-CM | POA: Diagnosis present

## 2012-02-18 DIAGNOSIS — D696 Thrombocytopenia, unspecified: Secondary | ICD-10-CM | POA: Diagnosis present

## 2012-02-18 DIAGNOSIS — M1990 Unspecified osteoarthritis, unspecified site: Secondary | ICD-10-CM | POA: Diagnosis present

## 2012-02-18 DIAGNOSIS — I4891 Unspecified atrial fibrillation: Secondary | ICD-10-CM | POA: Diagnosis present

## 2012-02-18 DIAGNOSIS — I10 Essential (primary) hypertension: Secondary | ICD-10-CM | POA: Diagnosis present

## 2012-02-18 DIAGNOSIS — S06300A Unspecified focal traumatic brain injury without loss of consciousness, initial encounter: Principal | ICD-10-CM | POA: Diagnosis present

## 2012-02-18 DIAGNOSIS — W19XXXA Unspecified fall, initial encounter: Secondary | ICD-10-CM | POA: Diagnosis present

## 2012-02-18 LAB — CBC
HCT: 39.1 % (ref 39.0–52.0)
MCH: 31.8 pg (ref 26.0–34.0)
MCHC: 33.2 g/dL (ref 30.0–36.0)
MCV: 94.2 fL (ref 78.0–100.0)
RBC: 4.15 MIL/uL — ABNORMAL LOW (ref 4.22–5.81)
RDW: 13.3 % (ref 11.5–15.5)
RDW: 13.4 % (ref 11.5–15.5)
WBC: 13.3 10*3/uL — ABNORMAL HIGH (ref 4.0–10.5)

## 2012-02-18 LAB — MRSA PCR SCREENING: MRSA by PCR: NEGATIVE

## 2012-02-18 LAB — CARDIAC PANEL(CRET KIN+CKTOT+MB+TROPI)
CK, MB: 4.6 ng/mL — ABNORMAL HIGH (ref 0.3–4.0)
CK, MB: 3.2 ng/mL (ref 0.3–4.0)
Relative Index: 0.3 (ref 0.0–2.5)
Total CK: 1678 U/L — ABNORMAL HIGH (ref 7–232)
Troponin I: <0.3 ng/mL (ref <0.30)

## 2012-02-18 LAB — POCT I-STAT TROPONIN I: Troponin i, poc: 0.06 ng/mL (ref 0.00–0.08)

## 2012-02-18 LAB — BASIC METABOLIC PANEL
BUN: 18 mg/dL (ref 6–23)
CO2: 29 mEq/L (ref 19–32)
Calcium: 9.4 mg/dL (ref 8.4–10.5)
Chloride: 102 mEq/L (ref 96–112)
Creatinine, Ser: 1.34 mg/dL (ref 0.50–1.35)

## 2012-02-18 LAB — URINALYSIS, ROUTINE W REFLEX MICROSCOPIC
Ketones, ur: 15 mg/dL — AB
Leukocytes, UA: NEGATIVE
Nitrite: NEGATIVE
Protein, ur: 30 mg/dL — AB
Urobilinogen, UA: 1 mg/dL (ref 0.0–1.0)

## 2012-02-18 LAB — URINE MICROSCOPIC-ADD ON

## 2012-02-18 LAB — TYPE AND SCREEN: Antibody Screen: NEGATIVE

## 2012-02-18 MED ORDER — ONDANSETRON HCL 4 MG/2ML IJ SOLN: 4.0000 mg | Freq: Four times a day (QID) | INTRAMUSCULAR | Status: DC | PRN

## 2012-02-18 MED ORDER — PANTOPRAZOLE SODIUM 40 MG PO TBEC
40.0000 mg | DELAYED_RELEASE_TABLET | Freq: Every day | ORAL | Status: DC
  Administered 2012-02-19 – 2012-02-25 (×7): 40 mg via ORAL
  Filled 2012-02-18 (×6): qty 1

## 2012-02-18 MED ORDER — ACETAMINOPHEN 325 MG PO TABS
650.0000 mg | ORAL_TABLET | Freq: Once | ORAL | Status: AC
  Administered 2012-02-18: 650 mg via ORAL
  Filled 2012-02-18: qty 2

## 2012-02-18 MED ORDER — SODIUM CHLORIDE 0.9 % IV SOLN
INTRAVENOUS | Status: DC
  Administered 2012-02-18 – 2012-02-20 (×4): via INTRAVENOUS

## 2012-02-18 MED ORDER — IRBESARTAN 150 MG PO TABS
150.0000 mg | ORAL_TABLET | Freq: Every day | ORAL | Status: DC
  Administered 2012-02-19 – 2012-02-25 (×7): 150 mg via ORAL
  Filled 2012-02-18 (×8): qty 1

## 2012-02-18 MED ORDER — AMIODARONE HCL 200 MG PO TABS
200.0000 mg | ORAL_TABLET | Freq: Every day | ORAL | Status: DC
  Administered 2012-02-18 – 2012-02-25 (×8): 200 mg via ORAL
  Filled 2012-02-18 (×8): qty 1

## 2012-02-18 MED ORDER — SODIUM CHLORIDE 0.9 % IJ SOLN
3.0000 mL | Freq: Two times a day (BID) | INTRAMUSCULAR | Status: DC
  Administered 2012-02-19 – 2012-02-22 (×7): 3 mL via INTRAVENOUS

## 2012-02-18 MED ORDER — ACETAMINOPHEN 650 MG RE SUPP: 650.0000 mg | Freq: Four times a day (QID) | RECTAL | Status: DC | PRN

## 2012-02-18 MED ORDER — ATORVASTATIN CALCIUM 20 MG PO TABS
20.0000 mg | ORAL_TABLET | Freq: Every day | ORAL | Status: DC
  Administered 2012-02-19 – 2012-02-24 (×6): 20 mg via ORAL
  Filled 2012-02-18 (×8): qty 1

## 2012-02-18 MED ORDER — EMPTY CONTAINERS FLEXIBLE MISC
2053.0000 [IU] | Status: AC
  Administered 2012-02-18: 2053 [IU] via INTRAVENOUS
  Filled 2012-02-18: qty 2053

## 2012-02-18 MED ORDER — ONDANSETRON HCL 4 MG PO TABS: 4.0000 mg | ORAL_TABLET | Freq: Four times a day (QID) | ORAL | Status: DC | PRN

## 2012-02-18 MED ORDER — SODIUM CHLORIDE 0.9 % IV BOLUS (SEPSIS)
500.0000 mL | INTRAVENOUS | Status: AC
  Administered 2012-02-18: 500 mL via INTRAVENOUS

## 2012-02-18 MED ORDER — ACETAMINOPHEN 325 MG PO TABS
650.0000 mg | ORAL_TABLET | Freq: Four times a day (QID) | ORAL | Status: DC | PRN
  Administered 2012-02-23: 650 mg via ORAL
  Filled 2012-02-18: qty 2

## 2012-02-18 MED ORDER — DIPHENHYDRAMINE HCL 50 MG/ML IJ SOLN
25.0000 mg | Freq: Once | INTRAMUSCULAR | Status: AC
  Administered 2012-02-18: 25 mg via INTRAVENOUS
  Filled 2012-02-18: qty 1

## 2012-02-18 NOTE — ED Notes (Signed)
Pt discharged with Elon Jester, RN at [phone]. Pharmacy tech provided Elon Jester, RN with medicine [place] as pt is being discharged from ED.

## 2012-02-18 NOTE — ED Notes (Signed)
Pt presents to ED after a fall last night. Pt was found this am by daughter. Pt states remembers the fall but states he did not hit his head but there is an obvious hematoma to forehead.

## 2012-02-18 NOTE — ED Notes (Signed)
Called [phone] to give report, nurse was eating, left number to call me back.

## 2012-02-18 NOTE — ED Notes (Signed)
Pt departed ED with Elon Jester, RN. Pharmacy tech provided [place] with Reinaldo Berber

## 2012-02-18 NOTE — ED Provider Notes (Addendum)
History     CSN: [id number]  Arrival date & time 02/18/12  0905   First MD Initiated Contact with Patient 02/18/12 [phone]      Chief Complaint  Patient presents with  . Fall    (Consider location/radiation/quality/duration/timing/severity/associated sxs/prior treatment) HPI Comments: Pt presents via EMS for evaluation s/p a fall.  He states he was walking at home last night when he tripped over the carpet and fell.  He was unable to get up from the floor and was found on the floor this morning by his daughter.  He had soilded himself while down.  He c/o right hip and lower back pain which has improved.  He did strike his head during the fall (takes xarelto) but denies any LOC.  Pt states that he feels fine now.  Patient is a 78 y.o. male presenting with fall. The history is provided by the patient and a relative. No language interpreter was used.  Fall The accident occurred 6 to 12 hours ago. The fall occurred while walking. Distance fallen: from standing. He landed on carpet. The point of impact was the right hip. The pain is present in the right hip (back). The pain is at a severity of 2/10. The pain is mild. He was not ambulatory at the scene. There was no entrapment after the fall. There was no drug use involved in the accident. There was no alcohol use involved in the accident. Pertinent negatives include no visual change, no fever, no numbness, no abdominal pain, no nausea, no vomiting, no hematuria, no headaches, no hearing loss, no loss of consciousness and no tingling. He has tried nothing for the symptoms.    Past Medical History  Diagnosis Date  . Hypertension   . Anemia   . Thrombocytopenia   . A-fib   . Peripheral vascular disease   . Fatigue   . Osteoarthritis   . Hyperlipidemia     Past Surgical History  Procedure Date  . Neck surgery     Family History  Problem Relation Age of Onset  . Alzheimer's disease Mother 19  . Cancer Father 34    prostate cancer  .  Cancer Brother     unknown cancer    History  Substance Use Topics  . Smoking status: Never Smoker   . Smokeless tobacco: Not on file  . Alcohol Use: No      Review of Systems  Constitutional: Negative for fever, chills, diaphoresis and fatigue.  HENT: Positive for facial swelling. Negative for congestion, rhinorrhea, trouble swallowing, neck pain, neck stiffness, dental problem and sinus pressure.   Eyes: Negative.   Respiratory: Negative for apnea, cough, chest tightness, shortness of breath and stridor.   Cardiovascular: Negative for chest pain, palpitations and leg swelling.  Gastrointestinal: Negative for nausea, vomiting, abdominal pain, diarrhea, constipation, blood in stool and abdominal distention.  Genitourinary: Negative for dysuria, urgency, frequency, hematuria, flank pain, decreased urine volume, enuresis and difficulty urinating.  Musculoskeletal:       Chronic stiffness in right leg and progressively worsening  difficulty walking and navigating environment  Skin: Negative.   Neurological: Positive for weakness. Negative for dizziness, tingling, tremors, seizures, loss of consciousness, syncope, facial asymmetry, light-headedness, numbness and headaches.  Hematological: Negative for adenopathy. Bruises/bleeds easily.       Takes blood thinner  Psychiatric/Behavioral: Negative.     Allergies  Lisinopril  Home Medications   Current Outpatient Rx  Name Route Sig Dispense Refill  . AMIODARONE HCL 200 MG  PO TABS Oral Take 200 mg by mouth daily.      . ATORVASTATIN CALCIUM 20 MG PO TABS Oral Take 20 mg by mouth daily.      . FUROSEMIDE 20 MG PO TABS Oral Take 20 mg by mouth daily.     . IRBESARTAN 150 MG PO TABS Oral Take 150 mg by mouth at bedtime.    Marland Kitchen POTASSIUM CHLORIDE CRYS ER 20 MEQ PO TBCR Oral Take 20 mEq by mouth daily.      Marland Kitchen RIVAROXABAN 20 MG PO TABS Oral Take 20 mg by mouth daily.      BP 130/64  Pulse 64  Temp 98.7 F (37.1 C) (Oral)  Resp 18   SpO2 100%  Physical Exam  Constitutional: He is oriented to person, place, and time. Vital signs are normal. He appears well-developed and well-nourished.  Non-toxic appearance. He does not have a sickly appearance. He does not appear ill. No distress.       Pt was soiled on arrival, alert, interactive and pleasant.  HENT:  Head: Normocephalic. Not macrocephalic and not microcephalic. Head is with abrasion and with contusion. Head is without raccoon's eyes, without Battle's sign, without laceration, without right periorbital erythema and without left periorbital erythema. No trismus in the jaw.    Right Ear: Hearing, tympanic membrane, external ear and ear canal normal.  Left Ear: Hearing, tympanic membrane, external ear and ear canal normal.  Nose: No mucosal edema, rhinorrhea, nasal deformity or nasal septal hematoma. No epistaxis.  Mouth/Throat: Oropharynx is clear and moist and mucous membranes are normal. No uvula swelling.  Eyes: Conjunctivae and EOM are normal. Pupils are equal, round, and reactive to light. Right eye exhibits no discharge. Left eye exhibits no discharge. No scleral icterus.  Neck: Normal range of motion. Neck supple. No JVD present. No tracheal deviation present. No thyromegaly present.  Cardiovascular: Normal rate and intact distal pulses.   Extrasystoles are present. PMI is not displaced.  Exam reveals distant heart sounds. Exam reveals no gallop, no friction rub and no decreased pulses.   Murmur heard.  Systolic murmur is present with a grade of 2/6  Pulmonary/Chest: Effort normal and breath sounds normal. No stridor. No respiratory distress. He has no wheezes. He has no rales. He exhibits no tenderness.  Abdominal: Soft. Bowel sounds are normal. He exhibits no distension. There is no tenderness. There is no rebound and no guarding.  Lymphadenopathy:    He has no cervical adenopathy.  Neurological: He is alert and oriented to person, place, and time. No cranial nerve  deficit. Coordination normal.  Skin: Skin is warm and dry. No rash noted. He is not diaphoretic. No erythema. No pallor.       Small forehead abrasion overlying small forehead hematoma  Psychiatric: He has a normal mood and affect. His behavior is normal. Judgment and thought content normal.    ED Course  Procedures (including critical care time)  Labs Reviewed - No data to display No results found.   No diagnosis found.   Date: 02/18/2012  Rate: 64 bpm  Rhythm: sinus  QRS Axis: left  Intervals: normal  ST/T Wave abnormalities: nonspecific ST changes  Conduction Disutrbances:incomplete RBBB + LAFB  Narrative Interpretation:   Old EKG Reviewed: changes noted     MDM  Pt presents for evaluation s/p mechanical fall.  Plan routine trauma eval including CT head, CXR, basic labs.  Will perform serial exams as labs are pending and provide appropriate disposition.  Discussed pt's imaging with the radiologist.  Pt has a right trochanteric fracture that is not will visualized on the xray.  Of more importance however, he has a small intercranial (interparenchymal) hemorrhage.  There is no noted mass effect or shift.    Concerned as he is currently taking xarelto and his coagulopathy cannot be acutely reversed.  He also has an elevated CK.  Given that he was discovered on the floor after >5 hours, he may have some rhabdomyolysis also.  IVF initiated.  Consults placed to internal medicine, neurosurgery, and orthopedic surgery.  Plan symptomatic care and admission to the hospital for further management.  Since he will be admitted to the hospital, will discuss with the on-call orthopedic specialist prior to obtaining any further imaging of the hip (CT vs MRI).  CRITICAL CARE Performed by: Dana Allan   Total critical care time: 35-32min  Critical care time was exclusive of separately billable procedures and treating other patients.  Critical care was necessary to treat or prevent  imminent or life-threatening deterioration.  Critical care was time spent personally by me on the following activities: development of treatment plan with patient and/or surrogate as well as nursing, discussions with consultants, evaluation of patient's response to treatment, examination of patient, obtaining history from patient or surrogate, ordering and performing treatments and interventions, ordering and review of laboratory studies, ordering and review of radiographic studies, pulse oximetry and re-evaluation of patient's condition.        Tobin Chad, MD 02/18/12 1156  [place] Jules Husbands, MD 02/18/12 1201

## 2012-02-18 NOTE — Progress Notes (Signed)
Received pt from ED. Upon assessment by both Debbie from rapid response and myself it was noted that per Oral Anticoagulation Rapid Reversal Protocol that any pt suffering a traumatic fall with any bleed noted on a CT scan be admitted to 3100. Pt has 2.4 x 1.0 intraparenchymal hematoma overlying the right frontal bone. Dr. Blake Divine was notified, waiting further orders. Will continue to monitor.

## 2012-02-18 NOTE — ED Notes (Signed)
Admitting MD at bedside.

## 2012-02-18 NOTE — Progress Notes (Signed)
Called to assist with administration of FEIBA for anit-coag reversal of patient taking Xarelto who was admitted with right frontal hemorrhage s/p fall.   [facility] team present to administer drug.  Reviewed Anti-Coag Rapid Reversal Orders and Algorithm with RN Whitney.  Whitney spoke with Dr. Blake Divine for orders per protocol.  Patient arouses but per daughter is more sleepy than normal.  Can answer some questions appropriate but confusion also present.  BP 136/73  HR 71 RR 20 O2sat 98% RA.  Will follow as needed.

## 2012-02-18 NOTE — Progress Notes (Signed)
Patient ID: [name], male   DOB: [birth year], 78 y.o.   MRN: [id number] BP 120/60  Pulse 61  Temp 98.7 F (37.1 C) (Oral)  Resp 20  Ht 6\' 2"  (1.88 m)  Wt 83.5 kg (184 lb 1.4 oz)  BMI 23.63 kg/m2  SpO2 99% Alert, oriented to person and place. I had been told that the patient was less responsive, however before he went to CT he was more responsive on my exam.  Head CT showed only a minimal change in the clot. Would not account for previous change. He is currently better. Moves all extremities, weakness in right lower extremity is unchanged.

## 2012-02-18 NOTE — H&P (Signed)
[name]  [name] MWU:[id number] DOB: [birth year] DOA: 02/18/2012   PCP: Georgianne Fick, MD   Chief Complaint: fall at home  HPI:  78 year old gentleman with prior h/o Atrial fibrillation on xerelto, was brought in by EMS, after the daughter found him on the floor this morning. As per the patient, he got out of the bed this morning, slipped and fell to the ground, and he was not able to get up as his right side of the hip was causing him pain. He denies any LOC. He denies any chest pain , sob, palpitations. He denies any fever or chills. On arrival to ED he underwent imaging of his hip and a ct head , showed intracerebral bleed in the frontal area. His hip X rays shows minimally displaced right hip fracture. Neurosurgery consult obtained , recommended no surgical intervention. Neurology consult obtained while in ED.  He will be admitted to [facility] for further management.   Review of Systems:  HENT: small laceration on the right side of the head. Eyes: Negative.  Respiratory:negative  Cardiovascular: Negative.  Gastrointestinal: Negative.  Genitourinary: Negative.  Musculoskeletal: Positive for falls.  Skin: Negative.  Neurological: Positive for  Right sided weakness.  Endo/Heme/Allergies: Negative.  Psychiatric/Behavioral: Negative.    Past Medical History  Diagnosis Date  . Hypertension   . Anemia   . Thrombocytopenia   . A-fib   . Peripheral vascular disease   . Fatigue   . Osteoarthritis   . Hyperlipidemia    Past Surgical History  Procedure Date  . Neck surgery    Social History:  reports that he has never smoked. He does not have any smokeless tobacco history on file. He reports that he does not drink alcohol or use illicit drugs.  where does patient live--home,   By himself  Can patient participate in ADLs yes  Allergies  Allergen Reactions  . Lisinopril     Patient states it causes runny nose, itchiness, and  allergy-type symptoms.    Family History  Problem Relation Age of Onset  . Alzheimer's disease Mother 3  . Cancer Father 59    prostate cancer  . Cancer Brother     unknown cancer    Prior to Admission medications   Medication Sig Start Date End Date Taking? Authorizing Provider  amiodarone (PACERONE) 200 MG tablet Take 200 mg by mouth daily.     Yes Historical Provider, MD  atorvastatin (LIPITOR) 20 MG tablet Take 20 mg by mouth daily.     Yes Historical Provider, MD  furosemide (LASIX) 20 MG tablet Take 20 mg by mouth daily.    Yes Historical Provider, MD  irbesartan (AVAPRO) 150 MG tablet Take 150 mg by mouth at bedtime.   Yes Historical Provider, MD  potassium chloride SA (K-DUR,KLOR-CON) 20 MEQ tablet Take 20 mEq by mouth daily.     Yes Historical Provider, MD  Rivaroxaban (XARELTO) 20 MG TABS Take 20 mg by mouth daily.   Yes Historical Provider, MD   Physical Exam: Filed Vitals:   02/18/12 0926 02/18/12 1113  BP: 130/64 127/79  Pulse: 64 68  Temp: 98.7 F (37.1 C) 98.9 F (37.2 C)  TempSrc: Oral Oral  Resp: 18 18  SpO2: 100% 100%    Constitutional: Vital signs reviewed.  Patient is a well-developed and well-nourished  in no acute distress and cooperative with exam. Alert and oriented x3. Small laceration on the right side of the head. Head: Normocephalic and  atraumatic Mouth: no erythema or exudates, MMM Eyes: right eye is closed PERRL, EOMI, conjunctivae normal, No scleral icterus.  Neck: Supple, Trachea midline normal ROM, No JVD, mass, thyromegaly, or carotid bruit present.  Cardiovascular: RRR, S1 normal, S2 normal, no MRG, pulses symmetric and intact bilaterally Pulmonary/Chest: CTAB, no wheezes, rales, or rhonchi Abdominal: Soft. Non-tender, non-distended, bowel sounds are normal, no masses, organomegaly, or guarding present.  Musculoskeletal: No joint deformities, erythema, or stiffness, ROM full and no nontender Neurological: A&O x3, right sided weakness ,  cranial nerve II-XII are grossly intact, no focal motor deficit, sensory intact to light touch bilaterally.  Skin: Warm, dry and intact. No rash, cyanosis, or clubbing.  Psychiatric: Normal mood and affect. speech and behavior is normal. Judgment and thought content normal.  Labs on Admission:  Basic Metabolic Panel:  Lab 02/18/12 1610  NA 141  K 4.2  CL 102  CO2 29  GLUCOSE 110*  BUN 18  CREATININE 1.34  CALCIUM 9.4  MG --  PHOS --   Liver Function Tests: No results found for this basename: AST:5,ALT:5,ALKPHOS:5,BILITOT:5,PROT:5,ALBUMIN:5 in the last 168 hours No results found for this basename: LIPASE:5,AMYLASE:5 in the last 168 hours No results found for this basename: AMMONIA:5 in the last 168 hours CBC:  Lab 02/18/12 1013  WBC 13.3*  NEUTROABS --  HGB 13.2  HCT 39.1  MCV 94.2  PLT 78*   Cardiac Enzymes:  Lab 02/18/12 1013  CKTOTAL 1506*  CKMB --  CKMBINDEX --  TROPONINI --    BNP (last 3 results) No results found for this basename: PROBNP:3 in the last 8760 hours CBG: No results found for this basename: GLUCAP:5 in the last 168 hours  Radiological Exams on Admission: Dg Chest 2 View  02/18/2012  *RADIOLOGY REPORT*  Clinical Data: Fall  CHEST - 2 VIEW  Comparison: 01/06/2012  Findings: Lungs are essentially clear.  No pleural effusion or pneumothorax.  Mild right hilar prominence, likely vascular.  The heart is normal in size.  Mild degenerative changes of the visualized thoracolumbar spine.  IMPRESSION: No evidence of acute cardiopulmonary disease.  Original Report Authenticated By: Charline Bills, M.D.   Dg Lumbar Spine Complete  02/18/2012  *RADIOLOGY REPORT*  Clinical Data: Fall, low back pain  LUMBAR SPINE - COMPLETE 4+ VIEW  Comparison: CT abdomen pelvis dated 01/08/2007.  Findings: Five lumbar-type vertebral bodies.  Normal lumbar lordosis.  No evidence of fracture or dislocation.  Vertebral body heights are essentially maintained, noting mild stable  changes involving L2.  Moderate to severe multilevel degenerative changes.  IMPRESSION: No fracture or dislocation is seen.  Moderate to severe multilevel degenerative changes.  Original Report Authenticated By: Charline Bills, M.D.   Dg Hip Complete Right  02/18/2012  *RADIOLOGY REPORT*  Clinical Data: Fall, right hip pain  RIGHT HIP - COMPLETE 2+ VIEW  Comparison: None.  Findings: Minimally displaced fracture involving the greater trochanter.  No definite intertrochanteric extension.  Degenerative changes of the lower lumbar spine.  Surgical clips in the pelvis.  IMPRESSION: Minimally displaced fracture involving the right greater trochanter.  No definite intertrochanteric extension.  If the patient is nonweightbearing, consider MRI or CT for further evaluation of the right hip.  Original Report Authenticated By: Charline Bills, M.D.   Ct Head Wo Contrast  02/18/2012  *RADIOLOGY REPORT*  Clinical Data: Fall, on blood thinners  CT HEAD WITHOUT CONTRAST  Technique:  Contiguous axial images were obtained from the base of the skull through the vertex without contrast.  Comparison: None.  Findings: 2.4 x 1.9 cm focus of hemorrhage in the subcortical right frontal lobe (series 2/image 17).  Very mild surrounding vasogenic edema.  No mass lesion, mass effect, or midline shift.  No CT evidence of acute infarction.  Subcortical white matter and periventricular small vessel ischemic changes.  Global cortical atrophy with secondary ventricular prominence.  The visualized paranasal sinuses are essentially clear. The mastoid air cells are unopacified.  Very mild soft tissue swelling overlying the right frontal bone. No evidence of calvarial fracture.  IMPRESSION: 2.4 x 1.9 cm intraparenchymal hematoma in the subcortical right frontal lobe.  Mild surrounding vasogenic edema.  Very mild soft tissue swelling overlying the right frontal bone. No underlying calvarial fracture.  Critical Value/emergent results were called  by telephone at the time of interpretation on 02/18/2012 at 1145 hours to Dr. Lorenso Courier, who verbally acknowledged these results.  Original Report Authenticated By: Charline Bills, M.D.    EKG: sinus arrythmia at 62/min  Assessment/Plan  1. Mechanical fall with an intracerebral bleed: Admit to ICU for observation. - with his h/o Xerelto use and INR of 3.36, and thrombocytopenia he was started on Reversal protocol of anticoagulation. - Neurology and neuro surgery consult obtained.  - avoid all anticoagulants and antiplatelet agents. - repeat CT head in am to evaluate the progression of the intracerebral bleed.  - keep the SBP< .  - MRI and MRA brain/ carotid duplex/ echo ORDERED.  - keep him on telemetry for evaluation of arrythmia's - serial  Cardiac enzymes - neuro checks. -. scd's for prophylaxis.  2. Right trochantric fracture: - bed rest  - ortho consult called by ED.  3. Atrial fibrillation: - resume amiodarone -hold xerelto  - will get cardio input if needed.  4. Leukocytosis: ? Stress margination. He is afebrile, CXR negative for pneumonia, UA pending.   5. Mild rhabdomyolysis: evident with elevated CK. - hydration and repeat CK in am.   6. DVT prophylaxis: scd's  Code Status: full code Family Communication: daughter at bedside Disposition Plan: pending PT/OT evaluation.  Time spent: 52 minutes.  [facility] Pager [phone]  If 7PM-7AM, please contact night-coverage [email or web address] Password [facility] 02/18/2012, 2:32 PM

## 2012-02-18 NOTE — Progress Notes (Signed)
Patient ID: [name], male   DOB: 08/12/1932, 78 y.o.   MRN: [id number] BP 136/63  Pulse 66  Temp 100.2 F (37.9 C) (Oral)  Resp 18  Ht 6\' 2"  (1.88 m)  Wt 83.5 kg (184 lb 1.4 oz)  BMI 23.63 kg/m2  SpO2 98% Since platelet count is 78000, and INR is 3.98 rapid reversal was instituted. I have ordered 2 units of platelets to be ordered and transfused.

## 2012-02-18 NOTE — Consult Note (Signed)
[facility] CONSULT [facility]     Reason for Consult:ICH LSN--unknown   HPI:    [name] is an 78 y.o. male who lives alone and fell at some time between 6pm yesterday and 1 am this am.  Patient was found by family members this AM after he called his daughter telling her he had fallen.  On arrival to the ED he complained of hip pain only.  CT head showed 2.4 x 1.9 cm intraparenchymal hematoma in the subcortical right frontal lobe.  Patient is on Xeralto for A-fib at time of arrival.  Currently patient has been taken off all antiplatelets and anticoagulants.  Oral anticoagulation protocol has been initiated. Neurology asked to consult.   Past Medical History  Diagnosis Date  . Hypertension   . Anemia   . Thrombocytopenia   . A-fib   . Peripheral vascular disease   . Fatigue   . Osteoarthritis   . Hyperlipidemia     Past Surgical History  Procedure Date  . Neck surgery     Family History  Problem Relation Age of Onset  . Alzheimer's disease Mother 5  . Cancer Father 44    prostate cancer  . Cancer Brother     unknown cancer    Social History:  reports that he has never smoked. He does not have any smokeless tobacco history on file. He reports that he does not drink alcohol or use illicit drugs.  Allergies  Allergen Reactions  . Lisinopril     Patient states it causes runny nose, itchiness, and allergy-type symptoms.    Medications:    Current Facility-Administered Medications  Medication Dose Route Frequency Provider Last Rate Last Dose  . 0.9 %  sodium chloride infusion   Intravenous Continuous Tobin Chad, MD 100 mL/hr at 02/18/12 1235    . sodium chloride 0.9 % bolus 500 mL  500 mL Intravenous STAT Tobin Chad, MD   500 mL at 02/18/12 1235   Current Outpatient Prescriptions  Medication Sig Dispense Refill  . amiodarone (PACERONE) 200 MG tablet Take 200 mg by mouth daily.        Marland Kitchen atorvastatin (LIPITOR) 20 MG tablet  Take 20 mg by mouth daily.        . furosemide (LASIX) 20 MG tablet Take 20 mg by mouth daily.       . irbesartan (AVAPRO) 150 MG tablet Take 150 mg by mouth at bedtime.      . potassium chloride SA (K-DUR,KLOR-CON) 20 MEQ tablet Take 20 mEq by mouth daily.        . Rivaroxaban (XARELTO) 20 MG TABS Take 20 mg by mouth daily.      Marland Kitchen DISCONTD: lisinopril (PRINIVIL,ZESTRIL) 20 MG tablet Take 20 mg by mouth daily.          Blood pressure 127/79, pulse 68, temperature 98.9 F (37.2 C), temperature source Oral, resp. rate 18, SpO2 100.00%.   Neurologic Examination:  Mental Status: Alert, oriented X 3.  Speech fluent without evidence of aphasia. Able to follow commands without difficulty. Thought content appropriate Cranial Nerves: II-Visual fields grossly intact. III/IV/VI-Extraocular movements intact.  Pupils reactive bilaterally. Ptosis of right eyelid present. V/VII-Smile symmetric VIII-grossly intact XI-bilateral shoulder shrug XII-midline tongue extension Motor: 4/5 bilateral UE with noted intrinsic hand muscle wasting noted.  Right LE had difficulty lifting against gravity due to  pain, in addition daughter states he has been unable to move right hip well for long time. Left LE showed 4/5 strength with no drift.  Bilateral DF/PF 4/5 strength. Normal tone and decreased bulk throughout.  Sensory: Pinprick and light touch intact throughout, bilaterally Deep Tendon Reflexes: 1+ bilateral Upper extremities, 2+ bilateral KJ and no AJ bilaterally. symmetric throughout.     Plantars:      Right:  Up going     Left:  Up going Cerebellar: Normal finger-to-nose showed no significant dysmetria.   No results found for this basename: cbc, bmp, coags, chol, tri, ldl, hga1c    Results for orders placed during the hospital encounter of 02/18/12 (from the past 48 hour(s))  CBC     Status: Abnormal   Collection Time   02/18/12 10:13 AM      Component Value Range Comment   WBC 13.3 (*) 4.0 - 10.5 K/uL     RBC 4.15 (*) 4.22 - 5.81 MIL/uL    Hemoglobin 13.2  13.0 - 17.0 g/dL    HCT 46.9  62.9 - 52.8 %    MCV 94.2  78.0 - 100.0 fL    MCH 31.8  26.0 - 34.0 pg    MCHC 33.8  30.0 - 36.0 g/dL    RDW 41.3  24.4 - 01.0 %    Platelets 78 (*) 150 - 400 K/uL PLATELET COUNT CONFIRMED BY SMEAR  BASIC METABOLIC PANEL     Status: Abnormal   Collection Time   02/18/12 10:13 AM      Component Value Range Comment   Sodium 141  135 - 145 mEq/L    Potassium 4.2  3.5 - 5.1 mEq/L    Chloride 102  96 - 112 mEq/L    CO2 29  19 - 32 mEq/L    Glucose, Bld 110 (*) 70 - 99 mg/dL    BUN 18  6 - 23 mg/dL    Creatinine, Ser 2.72  0.50 - 1.35 mg/dL    Calcium 9.4  8.4 - 53.6 mg/dL    GFR calc non Af Amer 49 (*) >90 mL/min    GFR calc Af Amer 57 (*) >90 mL/min   CK     Status: Abnormal   Collection Time   02/18/12 10:13 AM      Component Value Range Comment   Total CK 1506 (*) 7 - 232 U/L   PROTIME-INR     Status: Abnormal   Collection Time   02/18/12 10:13 AM      Component Value Range Comment   Prothrombin Time 39.4 (*) 11.6 - 15.2 seconds    INR 3.98 (*) 0.00 - 1.49   POCT I-STAT TROPONIN I     Status: Normal   Collection Time   02/18/12 11:26 AM      Component Value Range Comment   Troponin i, poc 0.06  0.00 - 0.08 ng/mL    Comment 3              Dg Chest 2 View  02/18/2012  *RADIOLOGY REPORT*  Clinical Data: Fall  CHEST - 2 VIEW  Comparison: 01/06/2012  Findings: Lungs are essentially clear.  No pleural effusion or pneumothorax.  Mild right hilar prominence, likely vascular.  The heart is normal in size.  Mild degenerative changes of the visualized thoracolumbar spine.  IMPRESSION: No evidence of acute cardiopulmonary disease.  Original Report Authenticated By: Charline Bills, M.D.   Dg Lumbar Spine Complete  02/18/2012  *RADIOLOGY  REPORT*  Clinical Data: Fall, low back pain  LUMBAR SPINE - COMPLETE 4+ VIEW  Comparison: CT abdomen pelvis dated 01/08/2007.  Findings: Five lumbar-type vertebral  bodies.  Normal lumbar lordosis.  No evidence of fracture or dislocation.  Vertebral body heights are essentially maintained, noting mild stable changes involving L2.  Moderate to severe multilevel degenerative changes.  IMPRESSION: No fracture or dislocation is seen.  Moderate to severe multilevel degenerative changes.  Original Report Authenticated By: Charline Bills, M.D.   Dg Hip Complete Right  02/18/2012  *RADIOLOGY REPORT*  Clinical Data: Fall, right hip pain  RIGHT HIP - COMPLETE 2+ VIEW  Comparison: None.  Findings: Minimally displaced fracture involving the greater trochanter.  No definite intertrochanteric extension.  Degenerative changes of the lower lumbar spine.  Surgical clips in the pelvis.  IMPRESSION: Minimally displaced fracture involving the right greater trochanter.  No definite intertrochanteric extension.  If the patient is nonweightbearing, consider MRI or CT for further evaluation of the right hip.  Original Report Authenticated By: Charline Bills, M.D.   Ct Head Wo Contrast  02/18/2012  *RADIOLOGY REPORT*  Clinical Data: Fall, on blood thinners  CT HEAD WITHOUT CONTRAST  Technique:  Contiguous axial images were obtained from the base of the skull through the vertex without contrast.  Comparison: None.  Findings: 2.4 x 1.9 cm focus of hemorrhage in the subcortical right frontal lobe (series 2/image 17).  Very mild surrounding vasogenic edema.  No mass lesion, mass effect, or midline shift.  No CT evidence of acute infarction.  Subcortical white matter and periventricular small vessel ischemic changes.  Global cortical atrophy with secondary ventricular prominence.  The visualized paranasal sinuses are essentially clear. The mastoid air cells are unopacified.  Very mild soft tissue swelling overlying the right frontal bone. No evidence of calvarial fracture.  IMPRESSION: 2.4 x 1.9 cm intraparenchymal hematoma in the subcortical right frontal lobe.  Mild surrounding vasogenic edema.   Very mild soft tissue swelling overlying the right frontal bone. No underlying calvarial fracture.  Critical Value/emergent results were called by telephone at the time of interpretation on 02/18/2012 at 1145 hours to Dr. Lorenso Courier, who verbally acknowledged these results.  Original Report Authenticated By: Charline Bills, M.D.    EKG--currently in sinus rhythm   Assessment/Plan:  78 YO male with history of A-fib (on Xeralto) presenting with a 2.4 x 1.9 cm intraparenchymal hematoma in the subcortical right frontal lobe and fracture of right hip.  Patients INR is 3.98/ PT is 39.4. Antiplatelets have been held and oral anticoagulation reversal protocol has been initiated.   Recommend: 1) No antiplatelets at this time do to ICH 2) Hold Xeralto. Anti-inhibitor coagulation protocol has been initiated.  3) Continue to keep SBP <160 to prevent further intracranial bleed.  4) Repeat CT head without contrast in AM 5) MRI/MRA brain without contrast, Carotid doppler, 2 D echo, FLP, HbA1C 6) PT to evaluate as tolerated 7)Telemetry   Felicie Morn PA-C [facility] [phone]  02/18/2012, 1:59 PM

## 2012-02-18 NOTE — Progress Notes (Signed)
Per Oral Anticoagulation Rapid Reversal Protocol, Dr. Blake Divine has ordered pt to be admitted to 3100. Awaiting bed placement. Will continue to monitor.

## 2012-02-18 NOTE — Progress Notes (Signed)
Pt transferred to 3112 per MD order. Report called to receiving nurse and all questions answered.

## 2012-02-18 NOTE — Consult Note (Signed)
Reason for Consult:Right frontal ICH Referring Physician: powers  RAWAD BOCHICCHIO is an 78 y.o. male.  HPI: whom was not able to stand after tripping in his home and striking his head. He could not stand up and had to await his daughter coming to his home to call for assistance. Found to have a right hip fracture and rhabdomyolysis.consulted for CT finding of a right frontal hemorrhage  Past Medical History  Diagnosis Date  . Hypertension   . Anemia   . Thrombocytopenia   . A-fib   . Peripheral vascular disease   . Fatigue   . Osteoarthritis   . Hyperlipidemia     Past Surgical History  Procedure Date  . Neck surgery     Family History  Problem Relation Age of Onset  . Alzheimer's disease Mother 99  . Cancer Father 85    prostate cancer  . Cancer Brother     unknown cancer    Social History:  reports that he has never smoked. He does not have any smokeless tobacco history on file. He reports that he does not drink alcohol or use illicit drugs.  Allergies:  Allergies  Allergen Reactions  . Lisinopril     Patient states it causes runny nose, itchiness, and allergy-type symptoms.    Medications: I have reviewed the patient's current medications.  Results for orders placed during the hospital encounter of 02/18/12 (from the past 48 hour(s))  CBC     Status: Abnormal   Collection Time   02/18/12 10:13 AM      Component Value Range Comment   WBC 13.3 (*) 4.0 - 10.5 K/uL    RBC 4.15 (*) 4.22 - 5.81 MIL/uL    Hemoglobin 13.2  13.0 - 17.0 g/dL    HCT 16.1  09.6 - 04.5 %    MCV 94.2  78.0 - 100.0 fL    MCH 31.8  26.0 - 34.0 pg    MCHC 33.8  30.0 - 36.0 g/dL    RDW 40.9  81.1 - 91.4 %    Platelets 78 (*) 150 - 400 K/uL PLATELET COUNT CONFIRMED BY SMEAR  BASIC METABOLIC PANEL     Status: Abnormal   Collection Time   02/18/12 10:13 AM      Component Value Range Comment   Sodium 141  135 - 145 mEq/L    Potassium 4.2  3.5 - 5.1 mEq/L    Chloride 102  96 - 112 mEq/L    CO2 29  19 - 32 mEq/L    Glucose, Bld 110 (*) 70 - 99 mg/dL    BUN 18  6 - 23 mg/dL    Creatinine, Ser 7.82  0.50 - 1.35 mg/dL    Calcium 9.4  8.4 - 95.6 mg/dL    GFR calc non Af Amer 49 (*) >90 mL/min    GFR calc Af Amer 57 (*) >90 mL/min   CK     Status: Abnormal   Collection Time   02/18/12 10:13 AM      Component Value Range Comment   Total CK 1506 (*) 7 - 232 U/L   PROTIME-INR     Status: Abnormal   Collection Time   02/18/12 10:13 AM      Component Value Range Comment   Prothrombin Time 39.4 (*) 11.6 - 15.2 seconds    INR 3.98 (*) 0.00 - 1.49   POCT I-STAT TROPONIN I     Status: Normal   Collection Time  02/18/12 11:26 AM      Component Value Range Comment   Troponin i, poc 0.06  0.00 - 0.08 ng/mL    Comment 3              Dg Chest 2 View  02/18/2012  *RADIOLOGY REPORT*  Clinical Data: Fall  CHEST - 2 VIEW  Comparison: 01/06/2012  Findings: Lungs are essentially clear.  No pleural effusion or pneumothorax.  Mild right hilar prominence, likely vascular.  The heart is normal in size.  Mild degenerative changes of the visualized thoracolumbar spine.  IMPRESSION: No evidence of acute cardiopulmonary disease.  Original Report Authenticated By: Charline Bills, M.D.   Dg Lumbar Spine Complete  02/18/2012  *RADIOLOGY REPORT*  Clinical Data: Fall, low back pain  LUMBAR SPINE - COMPLETE 4+ VIEW  Comparison: CT abdomen pelvis dated 01/08/2007.  Findings: Five lumbar-type vertebral bodies.  Normal lumbar lordosis.  No evidence of fracture or dislocation.  Vertebral body heights are essentially maintained, noting mild stable changes involving L2.  Moderate to severe multilevel degenerative changes.  IMPRESSION: No fracture or dislocation is seen.  Moderate to severe multilevel degenerative changes.  Original Report Authenticated By: Charline Bills, M.D.   Dg Hip Complete Right  02/18/2012  *RADIOLOGY REPORT*  Clinical Data: Fall, right hip pain  RIGHT HIP - COMPLETE 2+ VIEW  Comparison:  None.  Findings: Minimally displaced fracture involving the greater trochanter.  No definite intertrochanteric extension.  Degenerative changes of the lower lumbar spine.  Surgical clips in the pelvis.  IMPRESSION: Minimally displaced fracture involving the right greater trochanter.  No definite intertrochanteric extension.  If the patient is nonweightbearing, consider MRI or CT for further evaluation of the right hip.  Original Report Authenticated By: Charline Bills, M.D.   Ct Head Wo Contrast  02/18/2012  *RADIOLOGY REPORT*  Clinical Data: Fall, on blood thinners  CT HEAD WITHOUT CONTRAST  Technique:  Contiguous axial images were obtained from the base of the skull through the vertex without contrast.  Comparison: None.  Findings: 2.4 x 1.9 cm focus of hemorrhage in the subcortical right frontal lobe (series 2/image 17).  Very mild surrounding vasogenic edema.  No mass lesion, mass effect, or midline shift.  No CT evidence of acute infarction.  Subcortical white matter and periventricular small vessel ischemic changes.  Global cortical atrophy with secondary ventricular prominence.  The visualized paranasal sinuses are essentially clear. The mastoid air cells are unopacified.  Very mild soft tissue swelling overlying the right frontal bone. No evidence of calvarial fracture.  IMPRESSION: 2.4 x 1.9 cm intraparenchymal hematoma in the subcortical right frontal lobe.  Mild surrounding vasogenic edema.  Very mild soft tissue swelling overlying the right frontal bone. No underlying calvarial fracture.  Critical Value/emergent results were called by telephone at the time of interpretation on 02/18/2012 at 1145 hours to Dr. Lorenso Courier, who verbally acknowledged these results.  Original Report Authenticated By: Charline Bills, M.D.   Ct Hip Right Wo Contrast  02/18/2012  *RADIOLOGY REPORT*  Clinical Data: Right hip and leg pain following a fall last night. Minimally displaced right greater trochanter fracture on  radiographs earlier today.  CT OF THE RIGHT HIP WITHOUT CONTRAST  Technique:  Multidetector CT imaging was performed according to the standard protocol. Multiplanar CT image reconstructions were also generated.  Comparison: Right hip radiographs obtained earlier today.  Findings: Essentially nondisplaced fracture in the lateral aspect of the right greater trochanter.  No intertrochanteric extension or angulation.  No additional fractures are  seen.  IMPRESSION: Essentially nondisplaced fracture in the lateral aspect of the right greater trochanter.  Original Report Authenticated By: Darrol Angel, M.D.    Review of Systems  HENT: Positive for hearing loss.   Eyes:       Difficulty opening right eye  Respiratory: Negative.   Cardiovascular: Negative.   Gastrointestinal: Negative.   Genitourinary: Negative.   Musculoskeletal: Positive for falls.  Skin: Negative.   Neurological: Positive for focal weakness and weakness.       Weak right lower extremity  Endo/Heme/Allergies: Negative.   Psychiatric/Behavioral: Negative.    Blood pressure 127/79, pulse 68, temperature 98.9 F (37.2 C), temperature source Oral, resp. rate 18, SpO2 100.00%. Physical Exam  HENT:       Superficial laceration on right forehead with surrounding swelling  Neck: Normal range of motion. Neck supple.  Skin: Skin is warm and dry.  Psychiatric: He has a normal mood and affect. His behavior is normal. Judgment and thought content normal.   Alert and oriented x 4 Speech clear and fluent Perrl, full eom Symmetric lower facial movements, does not open right eye fully Symmetric facial sensation, hearing decreased to finger rub bilaterally Tongue and uvula both midline Shoulder shrug normal No drift  Weakness in right lower extremity, all muscle groups. Normal strength in the upper extremities and left lower extremity Intact propioception, and light touch Normal muscle tone and bulk Pain with manipulation of the right  hip  Assessment/Plan: Mr. Brum has a small hemorrhage in the right frontal lobe. No mass effect. Ventricles not effaced, no midline shift. Exam is not remarkable except for long standing weakness in his right lower extremity. He has been using a cane for ~10 years. If any surgical intervention is necessary for the hip no contraindications.  He does not need surgical intervention for this ich. Repeat CT tomorrow, or with any neurologic change  Francie Keeling L 02/18/2012, 2:48 PM

## 2012-02-18 NOTE — Consult Note (Signed)
Name: [name] MRN: [id number] DOB: Jul 22, 1933    LOS: 0  Referring Provider:  [facility] Reason for Referral:  ICB  PULMONARY / CRITICAL CARE MEDICINE  HPI:  78 y/o with atrial fibrillation on Xarelto admitted after fall at home without loss of consciousness resulting in right hip fracture and intracranial hemorrhage.  Transferred to ICU for implementation of anticoagulation reversal protocol.  Past Medical History  Diagnosis Date  . Hypertension   . Anemia   . Thrombocytopenia   . A-fib   . Peripheral vascular disease   . Fatigue   . Osteoarthritis   . Hyperlipidemia    Past Surgical History  Procedure Date  . Neck surgery    Prior to Admission medications   Medication Sig Start Date End Date Taking? Authorizing Provider  amiodarone (PACERONE) 200 MG tablet Take 200 mg by mouth daily.     Yes Historical Provider, MD  atorvastatin (LIPITOR) 20 MG tablet Take 20 mg by mouth daily.     Yes Historical Provider, MD  furosemide (LASIX) 20 MG tablet Take 20 mg by mouth daily.    Yes Historical Provider, MD  irbesartan (AVAPRO) 150 MG tablet Take 150 mg by mouth at bedtime.   Yes Historical Provider, MD  potassium chloride SA (K-DUR,KLOR-CON) 20 MEQ tablet Take 20 mEq by mouth daily.     Yes Historical Provider, MD  Rivaroxaban (XARELTO) 20 MG TABS Take 20 mg by mouth daily.   Yes Historical Provider, MD   Allergies Allergies  Allergen Reactions  . Lisinopril     Patient states it causes runny nose, itchiness, and allergy-type symptoms.   Family History Family History  Problem Relation Age of Onset  . Alzheimer's disease Mother 71  . Cancer Father 45    prostate cancer  . Cancer Brother     unknown cancer   Social History  reports that he has never smoked. He does not have any smokeless tobacco history on file. He reports that he does not drink alcohol or use illicit drugs.  Review Of Systems:  Right hip pain, otherwise negative.  Brief patient description:  78 y/o with  atrial fibrillation on Xarelto admitted after fall at home without loss of consciousness resulting in right hip fracture and intracranial hemorrhage.  Transferred to ICU for implementation of anticoagulation reversal protocol.  Events Since Admission: 7/29  Admitted after fall with right hip fracture and intracranial hemorrhage.  Anticoagulation reversal protocol began.  Current Status:  Vital Signs: Temp:  [98.3 F (36.8 C)-100.2 F (37.9 C)] 98.3 F (36.8 C) (07/29 1834) Pulse Rate:  [64-69] 66  (07/29 1900) Resp:  [17-22] 22  (07/29 1900) BP: (116-137)/(63-103) 129/103 mmHg (07/29 1900) SpO2:  [62 %-100 %] 62 % (07/29 1900) Weight:  [83.5 kg (184 lb 1.4 oz)-86.5 kg (190 lb 11.2 oz)] 83.5 kg (184 lb 1.4 oz) (07/29 1553)  Physical Examination: General:  No distress Neuro:  Awake, alert, cooperative, nonfocal HEENT:  PERRL Neck:  Supple Cardiovascular:  Regular, no murmurs Lungs:  CTAB Abdomen:  Soft, nontender Musculoskeletal:  R hip tenderness Skin:  Intact  Principal Problem:  *Intracerebral bleed Active Problems:  Hypertension  Thrombocytopenia  A-fib  Osteoarthritis  Fall from bed  Hip pain, right  Hip fracture, right  ASSESSMENT AND PLAN  PULMONARY No results found for this basename: PHART:5,PCO2:5,PCO2ART:5,PO2ART:5,HCO3:5,O2SAT:5 in the last 168 hours Ventilator Settings:   CXR:  NA ETT:  NA  A:  No active issues. P:   No intervention required  CARDIOVASCULAR  Lab 02/18/12 1515  TROPONINI <0.30  LATICACIDVEN --  PROBNP --   ECG:  7/29 >>> Sinus rhythm, left anterior fascicular block.  Lines: NA  A: History of atrial fibrillation.  Dyslipidemia.  Hypertension. P:  Telemetry Continue preadmission RX except Xeralto  RENAL  Lab 02/18/12 1013  NA 141  K 4.2  CL 102  CO2 29  BUN 18  CREATININE 1.34  CALCIUM 9.4  MG --  PHOS --   Intake/Output      07/29 0701 - 07/30 0700   I.V. (mL/kg) 300 (3.6)   Total Intake(mL/kg) 300 (3.6)    Net +300        Foley:  7/29 >>>  A:  No active issues. P:   No intervention required  GASTROINTESTINAL No results found for this basename: AST:5,ALT:5,ALKPHOS:5,BILITOT:5,PROT:5,ALBUMIN:5 in the last 168 hours  A:  No active issues. P:   No intervention required  HEMATOLOGIC  Lab 02/18/12 1515 02/18/12 1013  HGB -- 13.2  HCT -- 39.1  PLT -- 78*  INR -- 3.98*  APTT 41* --   A:  Xarelto induced coagulopathy.  Thrombocytopenia, preadmission. P:  Given FEIBA Deferring FFP transfusion as neurologically intact and unknown efficacy Platelets transfused per Neurosurgery  INFECTIOUS  Lab 02/18/12 1013  WBC 13.3*  PROCALCITON --   Cultures: NA Antibiotics: NA  A:  No active issues. P:   No intervention required  ENDOCRINE No results found for this basename: GLUCAP:5 in the last 168 hours A:  No active issues. P:   No intervention required  NEUROLOGIC  Head CT:  7/29 >>> 2.4 x 1.9 cm intraparenchymal hematoma in the subcortical right frontal lobe. Mild surrounding vasogenic edema.  Very mild soft tissue swelling overlying the right frontal bone. No underlying calvarial fracture.  A:  Small asymptomatic intraparenchymal right frontal lobe hemorrhage.  P:   Neurosurgery following - no indication for surgery Neurology following  OTHER A:  Right hip fracture P: Per EDP note Ortho consult requested in ED, awaiting  BEST PRACTICE / DISPOSITION Level of Care:  ICU Primary Service:  PCCM, back to [facility] when out of ICU Consultants:  Neurosurgery, Neurology, Ortho Code Status:  Full Diet:  Regular DVT Px:  SCDs GI Px:  Protonix Skin Integrity:  Intact Social / Family:  Not available  Lonia Farber, M.D. Pulmonary and Critical Care Medicine [facility] Pager: [phone]  02/18/2012, 7:10 PM

## 2012-02-19 ENCOUNTER — Inpatient Hospital Stay (HOSPITAL_COMMUNITY): Payer: Medicare HMO

## 2012-02-19 DIAGNOSIS — I629 Nontraumatic intracranial hemorrhage, unspecified: Secondary | ICD-10-CM

## 2012-02-19 DIAGNOSIS — D649 Anemia, unspecified: Secondary | ICD-10-CM

## 2012-02-19 HISTORY — PX: OTHER SURGICAL HISTORY: SHX169

## 2012-02-19 LAB — CBC
HCT: 29.6 % — ABNORMAL LOW (ref 39.0–52.0)
HCT: 32.2 % — ABNORMAL LOW (ref 39.0–52.0)
Hemoglobin: 10.1 g/dL — ABNORMAL LOW (ref 13.0–17.0)
Hemoglobin: 10.5 g/dL — ABNORMAL LOW (ref 13.0–17.0)
MCH: 32.2 pg (ref 26.0–34.0)
MCV: 94.3 fL (ref 78.0–100.0)
Platelets: 90 10*3/uL — ABNORMAL LOW (ref 150–400)
RBC: 3.14 MIL/uL — ABNORMAL LOW (ref 4.22–5.81)
RDW: 13.6 % (ref 11.5–15.5)
WBC: 7.9 10*3/uL (ref 4.0–10.5)

## 2012-02-19 LAB — PREPARE PLATELET PHERESIS: Unit division: 0

## 2012-02-19 LAB — CBC WITH DIFFERENTIAL/PLATELET
Basophils Absolute: 0 10*3/uL (ref 0.0–0.1)
Basophils Relative: 0 % (ref 0–1)
Eosinophils Absolute: 0.1 10*3/uL (ref 0.0–0.7)
Hemoglobin: 10.9 g/dL — ABNORMAL LOW (ref 13.0–17.0)
MCH: 32.1 pg (ref 26.0–34.0)
MCHC: 33.6 g/dL (ref 30.0–36.0)
Neutro Abs: 6.2 10*3/uL (ref 1.7–7.7)
Neutrophils Relative %: 83 % — ABNORMAL HIGH (ref 43–77)
Platelets: 90 10*3/uL — ABNORMAL LOW (ref 150–400)
RDW: 13.4 % (ref 11.5–15.5)

## 2012-02-19 LAB — LIPID PANEL
HDL: 57 mg/dL (ref >39)
LDL Cholesterol: 57 mg/dL (ref 0–99)
VLDL: 11 mg/dL (ref 0–40)

## 2012-02-19 LAB — CARDIAC PANEL(CRET KIN+CKTOT+MB+TROPI)
CK, MB: 2.4 ng/mL (ref 0.3–4.0)
Troponin I: <0.3 ng/mL (ref <0.30)

## 2012-02-19 LAB — COMPREHENSIVE METABOLIC PANEL
BUN: 18 mg/dL (ref 6–23)
CO2: 25 mEq/L (ref 19–32)
Calcium: 8.3 mg/dL — ABNORMAL LOW (ref 8.4–10.5)
Creatinine, Ser: 1.17 mg/dL (ref 0.50–1.35)
GFR calc Af Amer: 68 mL/min — ABNORMAL LOW (ref >90)
GFR calc non Af Amer: 58 mL/min — ABNORMAL LOW (ref >90)
Glucose, Bld: 103 mg/dL — ABNORMAL HIGH (ref 70–99)

## 2012-02-19 LAB — PROTIME-INR
INR: 1.96 — ABNORMAL HIGH (ref 0.00–1.49)
INR: 2.29 — ABNORMAL HIGH (ref 0.00–1.49)
Prothrombin Time: 22.7 seconds — ABNORMAL HIGH (ref 11.6–15.2)
Prothrombin Time: 25.6 seconds — ABNORMAL HIGH (ref 11.6–15.2)

## 2012-02-19 NOTE — Consult Note (Signed)
Reason for Consult: RIGHT hip pain after fall, trochanteric fracture, evaluate Referring Physician: Sahand, [name] is an 78 y.o. male.  HPI: 78yo male found after 24 hr period and brought to ER for evaluation, concern for rhabdomyolysis.  In scanning radiographic work up there was concern for greater trochanteric femur fracture, Orthopaedics was consulted.  At time of evaluation patient somewhat confused, no other history reported, no other orthopedic concerns related   Past Medical History  Diagnosis Date  . Hypertension   . Anemia   . Thrombocytopenia   . A-fib   . Peripheral vascular disease   . Fatigue   . Osteoarthritis   . Hyperlipidemia     Past Surgical History  Procedure Date  . Neck surgery     Family History  Problem Relation Age of Onset  . Alzheimer's disease Mother 15  . Cancer Father 38    prostate cancer  . Cancer Brother     unknown cancer    Social History:  reports that he has never smoked. He does not have any smokeless tobacco history on file. He reports that he does not drink alcohol or use illicit drugs.  Allergies:  Allergies  Allergen Reactions  . Lisinopril     Patient states it causes runny nose, itchiness, and allergy-type symptoms.    Medications:  I have reviewed the patient's current medications. Scheduled:   . acetaminophen  650 mg Oral Once  . amiodarone  200 mg Oral Daily  . atorvastatin  20 mg Oral q1800  . diphenhydrAMINE  25 mg Intravenous Once  . irbesartan  150 mg Oral Daily  . pantoprazole  40 mg Oral Q1200  . sodium chloride  3 mL Intravenous Q12H    Results for orders placed during the hospital encounter of 02/18/12 (from the past 24 hour(s))  CBC     Status: Abnormal   Collection Time   02/18/12  6:40 PM      Component Value Range   WBC 9.6  4.0 - 10.5 K/uL   RBC 3.97 (*) 4.22 - 5.81 MIL/uL   Hemoglobin 12.5 (*) 13.0 - 17.0 g/dL   HCT 16.1 (*) 09.6 - 04.5 %   MCV 95.0  78.0 - 100.0 fL   MCH  31.5  26.0 - 34.0 pg   MCHC 33.2  30.0 - 36.0 g/dL   RDW 40.9  81.1 - 91.4 %   Platelets 72 (*) 150 - 400 K/uL  CARDIAC PANEL(CRET KIN+CKTOT+MB+TROPI)     Status: Abnormal   Collection Time   02/18/12 10:10 PM      Component Value Range   Total CK 1678 (*) 7 - 232 U/L   CK, MB 3.2  0.3 - 4.0 ng/mL   Troponin I <0.30  <0.30 ng/mL   Relative Index 0.2  0.0 - 2.5  CBC     Status: Abnormal   Collection Time   02/19/12 12:11 AM      Component Value Range   WBC 7.9  4.0 - 10.5 K/uL   RBC 3.40 (*) 4.22 - 5.81 MIL/uL   Hemoglobin 10.5 (*) 13.0 - 17.0 g/dL   HCT 78.2 (*) 95.6 - 21.3 %   MCV 94.7  78.0 - 100.0 fL   MCH 30.9  26.0 - 34.0 pg   MCHC 32.6  30.0 - 36.0 g/dL   RDW 08.6  57.8 - 46.9 %   Platelets 100 (*) 150 - 400 K/uL  PROTIME-INR  Status: Abnormal   Collection Time   02/19/12 12:11 AM      Component Value Range   Prothrombin Time 25.6 (*) 11.6 - 15.2 seconds   INR 2.29 (*) 0.00 - 1.49  CBC     Status: Abnormal   Collection Time   02/19/12  6:57 AM      Component Value Range   WBC 7.7  4.0 - 10.5 K/uL   RBC 3.14 (*) 4.22 - 5.81 MIL/uL   Hemoglobin 10.1 (*) 13.0 - 17.0 g/dL   HCT 16.1 (*) 09.6 - 04.5 %   MCV 94.3  78.0 - 100.0 fL   MCH 32.2  26.0 - 34.0 pg   MCHC 34.1  30.0 - 36.0 g/dL   RDW 40.9  81.1 - 91.4 %   Platelets 90 (*) 150 - 400 K/uL  COMPREHENSIVE METABOLIC PANEL     Status: Abnormal   Collection Time   02/19/12  6:57 AM      Component Value Range   Sodium 138  135 - 145 mEq/L   Potassium 3.8  3.5 - 5.1 mEq/L   Chloride 105  96 - 112 mEq/L   CO2 25  19 - 32 mEq/L   Glucose, Bld 103 (*) 70 - 99 mg/dL   BUN 18  6 - 23 mg/dL   Creatinine, Ser 7.82  0.50 - 1.35 mg/dL   Calcium 8.3 (*) 8.4 - 10.5 mg/dL   Total Protein 5.9 (*) 6.0 - 8.3 g/dL   Albumin 2.8 (*) 3.5 - 5.2 g/dL   AST 40 (*) 0 - 37 U/L   ALT 14  0 - 53 U/L   Alkaline Phosphatase 60  39 - 117 U/L   Total Bilirubin 1.2  0.3 - 1.2 mg/dL   GFR calc non Af Amer 58 (*) >90 mL/min   GFR calc Af  Amer 68 (*) >90 mL/min  PROTIME-INR     Status: Abnormal   Collection Time   02/19/12  6:57 AM      Component Value Range   Prothrombin Time 22.7 (*) 11.6 - 15.2 seconds   INR 1.96 (*) 0.00 - 1.49  CARDIAC PANEL(CRET KIN+CKTOT+MB+TROPI)     Status: Abnormal   Collection Time   02/19/12  6:57 AM      Component Value Range   Total CK 1877 (*) 7 - 232 U/L   CK, MB 2.4  0.3 - 4.0 ng/mL   Troponin I <0.30  <0.30 ng/mL   Relative Index 0.1  0.0 - 2.5  LIPID PANEL     Status: Normal   Collection Time   02/19/12  6:57 AM      Component Value Range   Cholesterol 125  0 - 200 mg/dL   Triglycerides 54  <956 mg/dL   HDL 57  >21 mg/dL   Total CHOL/HDL Ratio 2.2     VLDL 11  0 - 40 mg/dL   LDL Cholesterol 57  0 - 99 mg/dL    X-ray: RIGHT HIP - COMPLETE 2+ VIEW  Comparison: None.  Findings: Minimally displaced fracture involving the greater  trochanter. No definite intertrochanteric extension.  Degenerative changes of the lower lumbar spine.  Surgical clips in the pelvis.  IMPRESSION:  Minimally displaced fracture involving the right greater  trochanter. No definite intertrochanteric extension.  If the patient is nonweightbearing, consider MRI or CT for further  evaluation of the right hip.   CT OF THE RIGHT HIP WITHOUT CONTRAST  Technique: Multidetector CT imaging  was performed according to the  standard protocol. Multiplanar CT image reconstructions were also  generated.  Comparison: Right hip radiographs obtained earlier today.  Findings: Essentially nondisplaced fracture in the lateral aspect  of the right greater trochanter. No intertrochanteric extension or  angulation. No additional fractures are seen.  IMPRESSION:  Essentially nondisplaced fracture in the lateral aspect of the  right greater trochanter.   ROS: not reported based on patient compliance   Blood pressure 122/59, pulse 60, temperature 98.6 F (37 C), temperature source Oral, resp. rate 20, height 6\' 2"  (1.88  m), weight 83.5 kg (184 lb 1.4 oz), SpO2 99.00%.  PE: in bed in unit, accompanied by nurse Some pain exhibited with rotation of the right leg and with direct palpation of the greater troch region  Otherwise palpable pulses  General medical exam and concerns addressed by admitting team and reviewed for purposes of consult  Assessment/Plan: Non displaced greater trochanteric femur fracture  Plan: No need for surgical intervention Patient can be weight bearing as tolerated but should use walker for 4-6 weeks depending on his clinical progress No active abduction exercises  Follow up in Ortho office in 4 weeks, Ashley [place], [phone]  Call for any other questions or concerns  Indea Dearman D 02/19/2012, 6:00 PM

## 2012-02-19 NOTE — Progress Notes (Signed)
Patient ID: [name], male   DOB: 04/17/1933, 78 y.o.   MRN: [id number] BP 121/55  Pulse 65  Temp 98.6 F (37 C) (Oral)  Resp 20  Ht 6\' 2"  (1.88 m)  Wt 83.5 kg (184 lb 1.4 oz)  BMI 23.63 kg/m2  SpO2 98% Alert confused at times, follows commands Speech is clear and fluent Perrl, full eom Symmetric facies Moving all extremities Platelet count is still low but for right now do not see need to continue platelet transfusions Will continue to monitor. Stable exam.

## 2012-02-19 NOTE — Progress Notes (Signed)
PT Cancellation Note  Evaluation cancelled today due to pt on bedrest.  Will see 02/20/12 as able. 02/19/2012  [place] Bing, PT [phone] (pager)

## 2012-02-19 NOTE — Consult Note (Signed)
Reason for Consult:78 year old male with right hip pain. Referring Physician:internal medicine physician  ROBET CRUTCHFIELD is an 78 y.o. male.  HPI: 78 year old male with a history of fall who is complaining of right hip pain.  He ultimately underwent x-rays and CAT scan which shows that he has minimal avulsion of the greater trochanter on the right side.  We are consulted for management of this injury.  The patient denies numbness or tingling or radiating pain.  He denies previous history of trauma to this area.the patient has had a long-term history of weakness on the right side with difficulty lifting and flexing his leg.  Past Medical History  Diagnosis Date  . Hypertension   . Anemia   . Thrombocytopenia   . A-fib   . Peripheral vascular disease   . Fatigue   . Osteoarthritis   . Hyperlipidemia     Past Surgical History  Procedure Date  . Neck surgery     Family History  Problem Relation Age of Onset  . Alzheimer's disease Mother 68  . Cancer Father 48    prostate cancer  . Cancer Brother     unknown cancer    Social History:  reports that he has never smoked. He does not have any smokeless tobacco history on file. He reports that he does not drink alcohol or use illicit drugs.  Allergies:  Allergies  Allergen Reactions  . Lisinopril     Patient states it causes runny nose, itchiness, and allergy-type symptoms.    Medications: I have reviewed the patient's current medications.  Results for orders placed during the hospital encounter of 02/18/12 (from the past 48 hour(s))  CBC     Status: Abnormal   Collection Time   02/18/12 10:13 AM      Component Value Range Comment   WBC 13.3 (*) 4.0 - 10.5 K/uL    RBC 4.15 (*) 4.22 - 5.81 MIL/uL    Hemoglobin 13.2  13.0 - 17.0 g/dL    HCT 16.1  09.6 - 04.5 %    MCV 94.2  78.0 - 100.0 fL    MCH 31.8  26.0 - 34.0 pg    MCHC 33.8  30.0 - 36.0 g/dL    RDW 40.9  81.1 - 91.4 %    Platelets 78 (*) 150 - 400 K/uL PLATELET COUNT  CONFIRMED BY SMEAR  BASIC METABOLIC PANEL     Status: Abnormal   Collection Time   02/18/12 10:13 AM      Component Value Range Comment   Sodium 141  135 - 145 mEq/L    Potassium 4.2  3.5 - 5.1 mEq/L    Chloride 102  96 - 112 mEq/L    CO2 29  19 - 32 mEq/L    Glucose, Bld 110 (*) 70 - 99 mg/dL    BUN 18  6 - 23 mg/dL    Creatinine, Ser 7.82  0.50 - 1.35 mg/dL    Calcium 9.4  8.4 - 95.6 mg/dL    GFR calc non Af Amer 49 (*) >90 mL/min    GFR calc Af Amer 57 (*) >90 mL/min   CK     Status: Abnormal   Collection Time   02/18/12 10:13 AM      Component Value Range Comment   Total CK 1506 (*) 7 - 232 U/L   PROTIME-INR     Status: Abnormal   Collection Time   02/18/12 10:13 AM      Component Value  Range Comment   Prothrombin Time 39.4 (*) 11.6 - 15.2 seconds    INR 3.98 (*) 0.00 - 1.49   POCT I-STAT TROPONIN I     Status: Normal   Collection Time   02/18/12 11:26 AM      Component Value Range Comment   Troponin i, poc 0.06  0.00 - 0.08 ng/mL    Comment 3            URINALYSIS, ROUTINE W REFLEX MICROSCOPIC     Status: Abnormal   Collection Time   02/18/12  3:09 PM      Component Value Range Comment   Color, Urine YELLOW  YELLOW    APPearance CLEAR  CLEAR    Specific Gravity, Urine 1.024  1.005 - 1.030    pH 6.0  5.0 - 8.0    Glucose, UA NEGATIVE  NEGATIVE mg/dL    Hgb urine dipstick MODERATE (*) NEGATIVE    Bilirubin Urine NEGATIVE  NEGATIVE    Ketones, ur 15 (*) NEGATIVE mg/dL    Protein, ur 30 (*) NEGATIVE mg/dL    Urobilinogen, UA 1.0  0.0 - 1.0 mg/dL    Nitrite NEGATIVE  NEGATIVE    Leukocytes, UA NEGATIVE  NEGATIVE   URINE MICROSCOPIC-ADD ON     Status: Normal   Collection Time   02/18/12  3:09 PM      Component Value Range Comment   Squamous Epithelial / LPF RARE  RARE    Bacteria, UA RARE  RARE   APTT     Status: Abnormal   Collection Time   02/18/12  3:15 PM      Component Value Range Comment   aPTT 41 (*) 24 - 37 seconds   HEPARIN LEVEL (UNFRACTIONATED)      Status: Abnormal   Collection Time   02/18/12  3:15 PM      Component Value Range Comment   Heparin Unfractionated >2.00 (*) 0.30 - 0.70 IU/mL   TYPE AND SCREEN     Status: Normal   Collection Time   02/18/12  3:15 PM      Component Value Range Comment   ABO/RH(D) O POS      Antibody Screen NEG      Sample Expiration 02/21/2012     CARDIAC PANEL(CRET KIN+CKTOT+MB+TROPI)     Status: Abnormal   Collection Time   02/18/12  3:15 PM      Component Value Range Comment   Total CK 1697 (*) 7 - 232 U/L    CK, MB 4.6 (*) 0.3 - 4.0 ng/mL    Troponin I <0.30  <0.30 ng/mL    Relative Index 0.3  0.0 - 2.5   ABO/RH     Status: Normal   Collection Time   02/18/12  3:15 PM      Component Value Range Comment   ABO/RH(D) O POS     POCT I-STAT TROPONIN I     Status: Normal   Collection Time   02/18/12  3:34 PM      Component Value Range Comment   Troponin i, poc 0.07  0.00 - 0.08 ng/mL    Comment 3            HEMOGLOBIN A1C     Status: Normal   Collection Time   02/18/12  5:04 PM      Component Value Range Comment   Hemoglobin A1C 5.5  <5.7 %    Mean Plasma Glucose 111  <117 mg/dL  TSH     Status: Normal   Collection Time   02/18/12  5:04 PM      Component Value Range Comment   TSH 0.360  0.350 - 4.500 uIU/mL   MRSA PCR SCREENING     Status: Normal   Collection Time   02/18/12  5:34 PM      Component Value Range Comment   MRSA by PCR NEGATIVE  NEGATIVE   PREPARE PLATELET PHERESIS     Status: Normal   Collection Time   02/18/12  5:47 PM      Component Value Range Comment   Unit Number [id number]      Blood Component Type PLTPHER LR1      Unit division 00      Status of Unit ISSUED,FINAL      Transfusion Status OK TO TRANSFUSE      Unit Number [id number]      Blood Component Type PLTPHER LR1      Unit division 00      Status of Unit ISSUED,FINAL      Transfusion Status OK TO TRANSFUSE     CBC     Status: Abnormal   Collection Time   02/18/12  6:40 PM      Component Value Range Comment     WBC 9.6  4.0 - 10.5 K/uL    RBC 3.97 (*) 4.22 - 5.81 MIL/uL    Hemoglobin 12.5 (*) 13.0 - 17.0 g/dL    HCT 78.2 (*) 95.6 - 52.0 %    MCV 95.0  78.0 - 100.0 fL    MCH 31.5  26.0 - 34.0 pg    MCHC 33.2  30.0 - 36.0 g/dL    RDW 21.3  08.6 - 57.8 %    Platelets 72 (*) 150 - 400 K/uL CONSISTENT WITH PREVIOUS RESULT  CARDIAC PANEL(CRET KIN+CKTOT+MB+TROPI)     Status: Abnormal   Collection Time   02/18/12 10:10 PM      Component Value Range Comment   Total CK 1678 (*) 7 - 232 U/L    CK, MB 3.2  0.3 - 4.0 ng/mL    Troponin I <0.30  <0.30 ng/mL    Relative Index 0.2  0.0 - 2.5   CBC     Status: Abnormal   Collection Time   02/19/12 12:11 AM      Component Value Range Comment   WBC 7.9  4.0 - 10.5 K/uL    RBC 3.40 (*) 4.22 - 5.81 MIL/uL    Hemoglobin 10.5 (*) 13.0 - 17.0 g/dL    HCT 46.9 (*) 62.9 - 52.0 %    MCV 94.7  78.0 - 100.0 fL    MCH 30.9  26.0 - 34.0 pg    MCHC 32.6  30.0 - 36.0 g/dL    RDW 52.8  41.3 - 24.4 %    Platelets 100 (*) 150 - 400 K/uL CONSISTENT WITH PREVIOUS RESULT  PROTIME-INR     Status: Abnormal   Collection Time   02/19/12 12:11 AM      Component Value Range Comment   Prothrombin Time 25.6 (*) 11.6 - 15.2 seconds    INR 2.29 (*) 0.00 - 1.49   CBC     Status: Abnormal   Collection Time   02/19/12  6:57 AM      Component Value Range Comment   WBC 7.7  4.0 - 10.5 K/uL    RBC 3.14 (*) 4.22 - 5.81 MIL/uL    Hemoglobin 10.1 (*)  13.0 - 17.0 g/dL    HCT 16.1 (*) 09.6 - 52.0 %    MCV 94.3  78.0 - 100.0 fL    MCH 32.2  26.0 - 34.0 pg    MCHC 34.1  30.0 - 36.0 g/dL    RDW 04.5  40.9 - 81.1 %    Platelets 90 (*) 150 - 400 K/uL CONSISTENT WITH PREVIOUS RESULT  COMPREHENSIVE METABOLIC PANEL     Status: Abnormal   Collection Time   02/19/12  6:57 AM      Component Value Range Comment   Sodium 138  135 - 145 mEq/L    Potassium 3.8  3.5 - 5.1 mEq/L    Chloride 105  96 - 112 mEq/L    CO2 25  19 - 32 mEq/L    Glucose, Bld 103 (*) 70 - 99 mg/dL    BUN 18  6 - 23  mg/dL    Creatinine, Ser 9.14  0.50 - 1.35 mg/dL    Calcium 8.3 (*) 8.4 - 10.5 mg/dL    Total Protein 5.9 (*) 6.0 - 8.3 g/dL    Albumin 2.8 (*) 3.5 - 5.2 g/dL    AST 40 (*) 0 - 37 U/L    ALT 14  0 - 53 U/L    Alkaline Phosphatase 60  39 - 117 U/L    Total Bilirubin 1.2  0.3 - 1.2 mg/dL    GFR calc non Af Amer 58 (*) >90 mL/min    GFR calc Af Amer 68 (*) >90 mL/min   PROTIME-INR     Status: Abnormal   Collection Time   02/19/12  6:57 AM      Component Value Range Comment   Prothrombin Time 22.7 (*) 11.6 - 15.2 seconds    INR 1.96 (*) 0.00 - 1.49   CARDIAC PANEL(CRET KIN+CKTOT+MB+TROPI)     Status: Abnormal   Collection Time   02/19/12  6:57 AM      Component Value Range Comment   Total CK 1877 (*) 7 - 232 U/L    CK, MB 2.4  0.3 - 4.0 ng/mL    Troponin I <0.30  <0.30 ng/mL    Relative Index 0.1  0.0 - 2.5   LIPID PANEL     Status: Normal   Collection Time   02/19/12  6:57 AM      Component Value Range Comment   Cholesterol 125  0 - 200 mg/dL    Triglycerides 54  <782 mg/dL    HDL 57  >95 mg/dL    Total CHOL/HDL Ratio 2.2      VLDL 11  0 - 40 mg/dL    LDL Cholesterol 57  0 - 99 mg/dL     Dg Chest 2 View  01/10/3085  *RADIOLOGY REPORT*  Clinical Data: Fall  CHEST - 2 VIEW  Comparison: 01/06/2012  Findings: Lungs are essentially clear.  No pleural effusion or pneumothorax.  Mild right hilar prominence, likely vascular.  The heart is normal in size.  Mild degenerative changes of the visualized thoracolumbar spine.  IMPRESSION: No evidence of acute cardiopulmonary disease.  Original Report Authenticated By: Charline Bills, M.D.   Dg Lumbar Spine Complete  02/18/2012  *RADIOLOGY REPORT*  Clinical Data: Fall, low back pain  LUMBAR SPINE - COMPLETE 4+ VIEW  Comparison: CT abdomen pelvis dated 01/08/2007.  Findings: Five lumbar-type vertebral bodies.  Normal lumbar lordosis.  No evidence of fracture or dislocation.  Vertebral body heights are essentially maintained, noting mild  stable  changes involving L2.  Moderate to severe multilevel degenerative changes.  IMPRESSION: No fracture or dislocation is seen.  Moderate to severe multilevel degenerative changes.  Original Report Authenticated By: Charline Bills, M.D.   Dg Hip Complete Right  02/18/2012  *RADIOLOGY REPORT*  Clinical Data: Fall, right hip pain  RIGHT HIP - COMPLETE 2+ VIEW  Comparison: None.  Findings: Minimally displaced fracture involving the greater trochanter.  No definite intertrochanteric extension.  Degenerative changes of the lower lumbar spine.  Surgical clips in the pelvis.  IMPRESSION: Minimally displaced fracture involving the right greater trochanter.  No definite intertrochanteric extension.  If the patient is nonweightbearing, consider MRI or CT for further evaluation of the right hip.  Original Report Authenticated By: Charline Bills, M.D.   Ct Head Wo Contrast  02/18/2012  *RADIOLOGY REPORT*  Clinical Data: Intraparenchymal hematoma follow-up.  CT HEAD WITHOUT CONTRAST  Technique:  Contiguous axial images were obtained from the base of the skull through the vertex without contrast.  Comparison: 02/18/2012  Findings: The subcortical right frontal lobe hematoma is slightly larger at 2.6 x 2.4 cm (2.4 x 1.9 cm previously).  There is surrounding edema which results in local sulcal effacement. However, no global mass effect/midline shift.  No new areas of hemorrhage or definitive CT evidence of additional acute infarction. Periventricular and subcortical white matter hypodensities are most in keeping with chronic microangiopathic change.  No overt hydrocephalus.  Partially empty sella.  No abnormal extra-axial fluid collections.  Partially opacified right maxillary sinus is nonspecific. Otherwise, the visualized paranasal sinuses and mastoid air cells are predominately clear. Mild right frontal scalp swelling is similar to prior.  No underlying calvarial fracture.  IMPRESSION: 2.6 x 2.4 cm intraparenchymal  hematoma in the subcortical right frontal lobe, slightly increased in size as above.  Mild surrounding vasogenic edema is similar.  Original Report Authenticated By: Waneta Martins, M.D.   Ct Head Wo Contrast  02/18/2012  *RADIOLOGY REPORT*  Clinical Data: Fall, on blood thinners  CT HEAD WITHOUT CONTRAST  Technique:  Contiguous axial images were obtained from the base of the skull through the vertex without contrast.  Comparison: None.  Findings: 2.4 x 1.9 cm focus of hemorrhage in the subcortical right frontal lobe (series 2/image 17).  Very mild surrounding vasogenic edema.  No mass lesion, mass effect, or midline shift.  No CT evidence of acute infarction.  Subcortical white matter and periventricular small vessel ischemic changes.  Global cortical atrophy with secondary ventricular prominence.  The visualized paranasal sinuses are essentially clear. The mastoid air cells are unopacified.  Very mild soft tissue swelling overlying the right frontal bone. No evidence of calvarial fracture.  IMPRESSION: 2.4 x 1.9 cm intraparenchymal hematoma in the subcortical right frontal lobe.  Mild surrounding vasogenic edema.  Very mild soft tissue swelling overlying the right frontal bone. No underlying calvarial fracture.  Critical Value/emergent results were called by telephone at the time of interpretation on 02/18/2012 at 1145 hours to Dr. Lorenso Courier, who verbally acknowledged these results.  Original Report Authenticated By: Charline Bills, M.D.   Mr [facility] Wo Contrast  02/19/2012  *RADIOLOGY REPORT*  Clinical Data:  78 year old male who was found on the floor this morning.  Question stroke.  The patient states he slipped and fell. He is unable to get up due to right-sided pain.  Right frontal lobe hemorrhage.  MRI HEAD WITHOUT CONTRAST MRA HEAD WITHOUT CONTRAST  Technique:  Multiplanar, multiecho pulse sequences of the brain and surrounding  structures were obtained without intravenous contrast. Angiographic  images of the head were obtained using MRA technique without contrast.  Comparison:  CT head without contrast 02/18/2012.  MRI HEAD  Findings:  A right frontal parenchymal hemorrhage is stable in size.  There is some surrounding edema. A focal area of susceptibility within the right cerebellum is compatible with a remote hemorrhage.  Calcifications are present over the vertex.  No other focal hemorrhages present.  Mild generalized atrophy is present.  Confluent periventricular scattered subcortical T2 and FLAIR hyperintensities are present bilaterally.  The ventricles are proportionate to the degree of atrophy.  No significant extra-axial fluid collection is present.  Flow is present in the major intracranial arteries.  The globes and orbits are intact.  Mild mucosal thickening is present in the anterior ethmoid air cells bilaterally.  Circumferential mucosal thickening is more prominent in the right than left maxillary sinuses.  A small amount of fluid is present in the inferior mastoid air cells bilaterally.  No obstructing nasopharyngeal lesion is present.  Basal ganglia calcifications are noted.  IMPRESSION:  1.  Stable appearance of a 2.6 cm right frontal opercular hemorrhage and surrounding edema. 2.  Focus of remote blood products in the right cerebellum. 3.  No other acute hemorrhage. 4.  Age advanced atrophy and moderate white matter disease.  This is nonspecific, but likely reflects the sequelae of chronic microvascular ischemia.  MRA HEAD  Findings: Mild tortuosity is evident within the cervical left internal carotid artery.  The A1 and M1 segments are normal.  The MCA bifurcations are within normal limits bilaterally.  There is some attenuation of distal MCA branch vessels bilaterally.  No significant proximal stenosis or occlusion is present.  There is no evidence for a significant vascular malformation associated with hemorrhage.  The right vertebral artery is the dominant vessel.  The left vertebral  artery essentially terminates at the PICA.  The basilar artery is within normal limits.  Both posterior cerebral arteries originate from basilar tip.  There is significant contribution from posterior communicating arteries bilaterally, more prominently on the left.  There is tapering of distal PCA branch vessels bilaterally.  IMPRESSION:  1.  Mild tortuosity of the distal cervical internal carotid arteries, more prominent on the left.  This can be seen in the setting of hypertension. 2.  No significant proximal stenosis, aneurysm, or branch vessel occlusion. 3.  No significant vascular malformation associated with hemorrhage. 4.  Tapering of distal branch vessels, compatible with mild small vessel disease.  Original Report Authenticated By: Jamesetta Orleans. MATTERN, M.D.   Mr Brain Wo Contrast  02/19/2012  *RADIOLOGY REPORT*  Clinical Data:  78 year old male who was found on the floor this morning.  Question stroke.  The patient states he slipped and fell. He is unable to get up due to right-sided pain.  Right frontal lobe hemorrhage.  MRI HEAD WITHOUT CONTRAST MRA HEAD WITHOUT CONTRAST  Technique:  Multiplanar, multiecho pulse sequences of the brain and surrounding structures were obtained without intravenous contrast. Angiographic images of the head were obtained using MRA technique without contrast.  Comparison:  CT head without contrast 02/18/2012.  MRI HEAD  Findings:  A right frontal parenchymal hemorrhage is stable in size.  There is some surrounding edema. A focal area of susceptibility within the right cerebellum is compatible with a remote hemorrhage.  Calcifications are present over the vertex.  No other focal hemorrhages present.  Mild generalized atrophy is present.  Confluent periventricular scattered subcortical T2 and  FLAIR hyperintensities are present bilaterally.  The ventricles are proportionate to the degree of atrophy.  No significant extra-axial fluid collection is present.  Flow is present in  the major intracranial arteries.  The globes and orbits are intact.  Mild mucosal thickening is present in the anterior ethmoid air cells bilaterally.  Circumferential mucosal thickening is more prominent in the right than left maxillary sinuses.  A small amount of fluid is present in the inferior mastoid air cells bilaterally.  No obstructing nasopharyngeal lesion is present.  Basal ganglia calcifications are noted.  IMPRESSION:  1.  Stable appearance of a 2.6 cm right frontal opercular hemorrhage and surrounding edema. 2.  Focus of remote blood products in the right cerebellum. 3.  No other acute hemorrhage. 4.  Age advanced atrophy and moderate white matter disease.  This is nonspecific, but likely reflects the sequelae of chronic microvascular ischemia.  MRA HEAD  Findings: Mild tortuosity is evident within the cervical left internal carotid artery.  The A1 and M1 segments are normal.  The MCA bifurcations are within normal limits bilaterally.  There is some attenuation of distal MCA branch vessels bilaterally.  No significant proximal stenosis or occlusion is present.  There is no evidence for a significant vascular malformation associated with hemorrhage.  The right vertebral artery is the dominant vessel.  The left vertebral artery essentially terminates at the PICA.  The basilar artery is within normal limits.  Both posterior cerebral arteries originate from basilar tip.  There is significant contribution from posterior communicating arteries bilaterally, more prominently on the left.  There is tapering of distal PCA branch vessels bilaterally.  IMPRESSION:  1.  Mild tortuosity of the distal cervical internal carotid arteries, more prominent on the left.  This can be seen in the setting of hypertension. 2.  No significant proximal stenosis, aneurysm, or branch vessel occlusion. 3.  No significant vascular malformation associated with hemorrhage. 4.  Tapering of distal branch vessels, compatible with mild  small vessel disease.  Original Report Authenticated By: Jamesetta Orleans. MATTERN, M.D.   Ct Hip Right Wo Contrast  02/18/2012  *RADIOLOGY REPORT*  Clinical Data: Right hip and leg pain following a fall last night. Minimally displaced right greater trochanter fracture on radiographs earlier today.  CT OF THE RIGHT HIP WITHOUT CONTRAST  Technique:  Multidetector CT imaging was performed according to the standard protocol. Multiplanar CT image reconstructions were also generated.  Comparison: Right hip radiographs obtained earlier today.  Findings: Essentially nondisplaced fracture in the lateral aspect of the right greater trochanter.  No intertrochanteric extension or angulation.  No additional fractures are seen.  IMPRESSION: Essentially nondisplaced fracture in the lateral aspect of the right greater trochanter.  Original Report Authenticated By: Darrol Angel, M.D.    ROS: I have reviewed the patient's review of systems thoroughly and there are no positive responses as relates to the HPI. EXAM Blood pressure 145/69, pulse 71, temperature 98 F (36.7 C), temperature source Oral, resp. rate 16, height 6\' 2"  (1.88 m), weight 83.5 kg (184 lb 1.4 oz), SpO2 98.00%. Well-developed well-nourished patient in no acute distress. Alert and oriented x3 HEENT:within normal limits Cardiac: Regular rate and rhythm Pulmonary: Lungs clear to auscultation Abdomen: Soft and nontender.  Normal active bowel sounds  Musculoskeletal: (right lower extremity shows minimal tenderness palpation of the lateral aspect of the hip area and there is no pain with range of motion of the right hip.  There are 2+ distal pulses.  There is a  negative heel strike.)  Assessment/Plan: 78 year old male with nondisplaced right greater trochanteric fracture.  The patient may be full weightbearing on the right side.  He may sit stand roll over in bed.  We will get him in physical therapy when appropriate.  The only restriction will be no  active abduction on the right side.I reviewed these findings with his attending physician and the physical therapist who is going to be getting him up.  I will follow him in my office in 2 weeks for repeat x-rays and as needed.  If the patient is asymptomatic at that point I am not certain that followup was critical but I will plan to seeing in the office in 2 weeks.  Olsen Mccutchan L 02/19/2012, 1:49 PM

## 2012-02-19 NOTE — Consult Note (Signed)
Name: [name] MRN: [id number] DOB: April 11, 1933    LOS: 1  Referring Provider:  [facility] Reason for Referral:  ICB  PULMONARY / CRITICAL CARE MEDICINE  Brief patient description:  78 y/o with atrial fibrillation on Xarelto admitted after fall at home without loss of consciousness resulting in right hip fracture and intracranial hemorrhage.  Transferred to ICU for implementation of anticoagulation reversal protocol.  Events Since Admission: 7/29  Admitted after fall with right hip fracture and intracranial hemorrhage.  Anticoagulation reversal protocol began. 7/30 MRi brain>>>  Current Status: Not on vent  Vital Signs: Temp:  [98.2 F (36.8 C)-100.2 F (37.9 C)] 98.9 F (37.2 C) (07/30 0800) Pulse Rate:  [25-92] 92  (07/30 0200) Resp:  [14-24] 24  (07/30 0900) BP: (87-154)/(43-103) 122/51 mmHg (07/30 0900) SpO2:  [62 %-100 %] 99 % (07/30 0800) Weight:  [83.5 kg (184 lb 1.4 oz)-86.5 kg (190 lb 11.2 oz)] 83.5 kg (184 lb 1.4 oz) (07/29 1553)  Physical Examination: General:  No distress Neuro:  Awake, alert, cooperative, nonfocal HEENT:  PERRL Neck:  Supple Cardiovascular:  Regular, no murmurs Lungs:  Clear lungs Abdomen:  Soft, nontender Musculoskeletal:  R hip tenderness Skin:  Intact  Principal Problem:  *Intracerebral bleed Active Problems:  Hypertension  Thrombocytopenia  A-fib  Osteoarthritis  Fall from bed  Hip pain, right  Hip fracture, right  ASSESSMENT AND PLAN  PULMONARY No results found for this basename: PHART:5,PCO2:5,PCO2ART:5,PO2ART:5,HCO3:5,O2SAT:5 in the last 168 hours Ventilator Settings:   CXR:  NA ETT:  NA  A:  No active issues. P:   pcxr reviewed Monitor for type 4 resp failure  CARDIOVASCULAR  Lab 02/19/12 0657 02/18/12 2210 02/18/12 1515  TROPONINI <0.30 <0.30 <0.30  LATICACIDVEN -- -- --  PROBNP -- -- --   ECG:  7/29 >>> Sinus rhythm, left anterior fascicular block.  Lines: NA  A: History of atrial fibrillation.   Dyslipidemia.  Hypertension. P:  Telemetry Continue preadmission RX except Xeralto Keep normotensive, avoid hypotension Keep MAP 75  RENAL  Lab 02/19/12 0657 02/18/12 1013  NA 138 141  K 3.8 4.2  CL 105 102  CO2 25 29  BUN 18 18  CREATININE 1.17 1.34  CALCIUM 8.3* 9.4  MG -- --  PHOS -- --   Intake/Output      07/29 0701 - 07/30 0700 07/30 0701 - 07/31 0700   P.O.  120   I.V. (mL/kg) 1600 (19.2) 200 (2.4)   Blood 509    Total Intake(mL/kg) 2109 (25.3) 320 (3.8)   Urine (mL/kg/hr) 925 (0.5) 80   Total Output 925 80   Net +1184 +240         Foley:  7/29 >>>  A:  No active issues. P:   Npo, maintenance fluids bmet follow up trend crt  GASTROINTESTINAL  Lab 02/19/12 0657  AST 40*  ALT 14  ALKPHOS 60  BILITOT 1.2  PROT 5.9*  ALBUMIN 2.8*    A:  No active issues. P:   Heart healthy doet consider gastric protection  HEMATOLOGIC  Lab 02/19/12 0657 02/19/12 0011 02/18/12 1840 02/18/12 1515 02/18/12 1013  HGB 10.1* 10.5* 12.5* -- 13.2  HCT 29.6* 32.2* 37.7* -- 39.1  PLT 90* 100* 72* -- 78*  INR 1.96* 2.29* -- -- 3.98*  APTT -- -- -- 41* --   A:  Xarelto induced coagulopathy.  Thrombocytopenia, preadmission. P:  Given FEIBA 7/29 Deferring FFP further  transfusion as neurologically intact and unknown efficacy, awiat MRI Platelets  transfused per Neurosurgery 7/29 likely our goal should be plat greater 100 k in setting irreversible xarelto, will eval MRI and d/w NS prior to tx scd Cbc in 1800  INFECTIOUS  Lab 02/19/12 0657 02/19/12 0011 02/18/12 1840 02/18/12 1013  WBC 7.7 7.9 9.6 13.3*  PROCALCITON -- -- -- --   Cultures: NA Antibiotics: NA  A:  No active issues. P:   No intervention required  ENDOCRINE No results found for this basename: GLUCAP:5 in the last 168 hours A:  No active issues. P:   No intervention required  NEUROLOGIC  Head CT:  7/29 >>> 2.4 x 1.9 cm intraparenchymal hematoma in the subcortical right frontal lobe. Mild  surrounding vasogenic edema.  Very mild soft tissue swelling overlying the right frontal bone. No underlying calvarial fracture. 7/30 MRI brain>>>  A:  Small asymptomatic intraparenchymal right frontal lobe hemorrhage.  P:   Neurosurgery following - no indication for surgery Neurology following, for MRI, did stroke come first? Then fall Avoid free water, reduce rate  OTHER A:  Right hip fracture P: Per EDP note Ortho consult requested in ED, awaiting, will call back  BEST PRACTICE / DISPOSITION Level of Care:  ICU Primary Service:  PCCM, back to [facility] when out of ICU Consultants:  Neurosurgery, Neurology, Ortho Code Status:  Full Diet:  Regular DVT Px:  SCDs GI Px:  Protonix Skin Integrity:  Intact Social / Family:  Not available  Nelda Bucks., M.D. Pulmonary and Critical Care Medicine [facility] Pager: [phone]  02/19/2012, 10:30 AM

## 2012-02-19 NOTE — Progress Notes (Signed)
  Echocardiogram 2D Echocardiogram has been performed.  [name] 02/19/2012, 12:07 PM

## 2012-02-19 NOTE — Evaluation (Signed)
Physical Therapy Evaluation Patient Details Name: [name] MRN: [id number] DOB: [birth year] Today's Date: 02/19/2012 Time: 9528-4132 PT Time Calculation (min): 29 min  PT Assessment / Plan / Recommendation Clinical Impression  pt s/p fall with R hip fx potentially as a result of R frontal hemorrhage.  On eval, pt has difficulty maintaining balance in static stance with lmoderate list to L and poor w/shift.  Also had difficulty with bil knee stability when trying to march in place.  Pt was not safe for any gait training today.  Pt can benefit from CIR therapies if daughter can commit to asssit/Supervise after D/C    PT Assessment  Patient needs continued PT services    Follow Up Recommendations  Inpatient Rehab;Supervision for mobility/OOB    Barriers to Discharge        Equipment Recommendations  Defer to next venue    Recommendations for Other Services Rehab consult   Frequency Min 3X/week    Precautions / Restrictions Precautions Precautions: Fall;Other (comment) (NO active hip abduction R) Restrictions Weight Bearing Restrictions: No   Pertinent Vitals/Pain       Mobility  Bed Mobility Bed Mobility: Supine to Sit;Sitting - Scoot to [place] of Bed;Sit to Supine Supine to Sit: 3: Mod assist Sitting - Scoot to Edge of Bed: 4: Min guard Sit to Supine: 1: +2 Total assist;HOB flat Sit to Supine: Patient Percentage: 60% Details for Bed Mobility Assistance: vc's for technique, hand placement and safety; truncal assist Transfers Transfers: Sit to Stand;Stand to Sit Sit to Stand: 1: +2 Total assist;With upper extremity assist;From bed Sit to Stand: Patient Percentage: 50% Stand to Sit: 1: +2 Total assist;To bed Stand to Sit: Patient Percentage: 50% Details for Transfer Assistance:  (vc's for hand placement; stability and lifting assist) Ambulation/Gait Ambulation/Gait Assistance: Not tested (comment) Stairs: No Wheelchair Mobility Wheelchair Mobility: No    Exercises      PT Diagnosis: Difficulty walking;Generalized weakness;Acute pain  PT Problem List: Decreased strength;Decreased activity tolerance;Decreased range of motion;Decreased balance;Decreased mobility;Decreased coordination;Decreased knowledge of use of DME;Pain;Decreased knowledge of precautions PT Treatment Interventions: DME instruction;Gait training;Functional mobility training;Therapeutic activities;Balance training;Neuromuscular re-education;Patient/family education   PT Goals Acute Rehab PT Goals PT Goal Formulation: With patient/family Time For Goal Achievement: 02/26/12 Potential to Achieve Goals: Fair Pt will go Supine/Side to Sit: with supervision PT Goal: Supine/Side to Sit - Progress: Goal set today Pt will go Sit to Stand: with min assist PT Goal: Sit to Stand - Progress: Goal set today Pt will Transfer Bed to Chair/Chair to Bed: with min assist PT Transfer Goal: Bed to Chair/Chair to Bed - Progress: Goal set today Pt will Ambulate: 16 - 50 feet;with mod assist;with least restrictive assistive device PT Goal: Ambulate - Progress: Goal set today  Visit Information  Last PT Received On: 02/19/12 Assistance Needed: +2 Reason Eval/Treat Not Completed: Patient not medically ready    Subjective Data  Subjective: Daughter relates that although pt was Independent at home with the cane, he was just getting by (functionally) Patient Stated Goal: Get back home   Prior Functioning  Home Living Lives With: Alone Available Help at Discharge: Friend(s);Family;Other (Comment) Type of Home: House Home Access: Stairs to enter [employer] of Steps: 3 Entrance Stairs-Rails: None Home Layout: One level Bathroom Shower/Tub: Tub/shower unit;Walk-in shower Bathroom Toilet: Standard Home Adaptive Equipment: Grab bars in shower;Straight cane Prior Function Level of Independence: Independent with assistive device(s) Driving: Yes Communication Communication: No difficulties      Cognition  Overall Cognitive  Status: Appears within functional limits for tasks assessed/performed Arousal/Alertness: Awake/alert Orientation Level: Appears intact for tasks assessed Behavior During Session: [facility] for tasks performed    Extremity/Trunk Assessment Right Lower Extremity Assessment RLE ROM/Strength/Tone: Deficits RLE ROM/Strength/Tone Deficits: stiff and slow to move against gravity; at least 3/5 Left Lower Extremity Assessment LLE ROM/Strength/Tone: Deficits LLE ROM/Strength/Tone Deficits: grossly 3+/5   Balance Balance Balance Assessed: Yes Static Sitting Balance Static Sitting - Balance Support: Right upper extremity supported;Left upper extremity supported;Feet supported Static Sitting - Level of Assistance: 5: Stand by assistance Static Sitting - Comment/# of Minutes: 5 Static Standing Balance Static Standing - Balance Support: Left upper extremity supported;Right upper extremity supported;During functional activity (standing in a RW) Static Standing - Comment/# of Minutes: moderate lean left with decreased ablitity to w/shift to R LE.  Also unable to w/shift R to widen BOS by stepping L LE to the L  End of Session PT - End of Session Equipment Utilized During Treatment: Gait belt Activity Tolerance: Patient tolerated treatment well Patient left: in bed;with call bell/phone within reach;with family/visitor present Nurse Communication: Mobility status  GP     Malley Hauter, Eliseo Gum 02/19/2012, 5:29 PM  02/19/2012  [place], PT [phone] (pager)

## 2012-02-19 NOTE — Progress Notes (Signed)
Stroke Team Progress Note  HISTORY [name] is an 78 y.o. male who lives alone and fell at some time between 6pm yesterday and 1 am this am. Patient was found by family members this AM after he called his daughter telling her he had fallen. On arrival to the ED he complained of hip pain only. CT head showed 2.4 x 1.9 cm intraparenchymal hematoma in the subcortical right frontal lobe. Patient is on Xeralto for A-fib at time of arrival. Currently patient has been taken off all antiplatelets and anticoagulants. Oral anticoagulation protocol has been initiated. Neurology asked to consult.   Patient was not a TPA candidate secondary to hemorrhage. He was admitted to the neuro ICU for further evaluation and treatment.  SUBJECTIVE No family is at the bedside.  Overall he feels his condition is unchanged. He has no new complaints.  OBJECTIVE Most recent Vital Signs: Filed Vitals:   02/19/12 0400 02/19/12 0500 02/19/12 0600 02/19/12 0700  BP: 144/58 142/59 129/56 133/55  Pulse:      Temp: 98.4 F (36.9 C)     TempSrc: Oral     Resp: 21 19 21 18   Height:      Weight:      SpO2:   98%    Intake/Output from previous day: 07/29 0701 - 07/30 0700 In: 2109 [I.V.:1600; Blood:509] Out: 925 [Urine:925]  IV Fluid Intake:     . sodium chloride 100 mL/hr at 02/19/12 0700   MEDICATIONS    . acetaminophen  650 mg Oral Once  . amiodarone  200 mg Oral Daily  . anti-inhibitor coagulant complex (FEIBA VH) IVPB  2,053 Units Intravenous STAT  . atorvastatin  20 mg Oral q1800  . diphenhydrAMINE  25 mg Intravenous Once  . irbesartan  150 mg Oral Daily  . pantoprazole  40 mg Oral Q1200  . sodium chloride  500 mL Intravenous STAT  . sodium chloride  3 mL Intravenous Q12H   PRN:  acetaminophen, acetaminophen, ondansetron (ZOFRAN) IV, ondansetron  Diet:  Cardiac thin liquids Activity:  Bedrest DVT Prophylaxis:  SCDs   CLINICALLY SIGNIFICANT STUDIES Basic Metabolic Panel:  Lab 02/18/12 4782  NA  141  K 4.2  CL 102  CO2 29  GLUCOSE 110*  BUN 18  CREATININE 1.34  CALCIUM 9.4  MG --  PHOS --   Liver Function Tests: No results found for this basename: AST:2,ALT:2,ALKPHOS:2,BILITOT:2,PROT:2,ALBUMIN:2 in the last 168 hours CBC:  Lab 02/19/12 0657 02/19/12 0011  WBC 7.7 7.9  NEUTROABS -- --  HGB 10.1* 10.5*  HCT 29.6* 32.2*  MCV 94.3 94.7  PLT 90* 100*   Coagulation:  Lab 02/19/12 0657 02/19/12 0011 02/18/12 1013  LABPROT 22.7* 25.6* 39.4*  INR 1.96* 2.29* 3.98*   Cardiac Enzymes:  Lab 02/18/12 2210 02/18/12 1515 02/18/12 1013  CKTOTAL 1678* 1697* 1506*  CKMB 3.2 4.6* --  CKMBINDEX -- -- --  TROPONINI <0.30 <0.30 --   Urinalysis:  Lab 02/18/12 1509  COLORURINE YELLOW  LABSPEC 1.024  PHURINE 6.0  GLUCOSEU NEGATIVE  HGBUR MODERATE*  BILIRUBINUR NEGATIVE  KETONESUR 15*  PROTEINUR 30*  UROBILINOGEN 1.0  NITRITE NEGATIVE  LEUKOCYTESUR NEGATIVE   Lipid Panel No results found for this basename: chol, trig, hdl, cholhdl, vldl, ldlcalc   HgbA1C  Lab Results  Component Value Date   HGBA1C 5.5 02/18/2012   Urine Drug Screen:   No results found for this basename: labopia, cocainscrnur, labbenz, amphetmu, thcu, labbarb    Alcohol Level: No results found  for this basename: ETH:2 in the last 168 hours  Dg Lumbar Spine Complete 02/18/2012  No fracture or dislocation is seen.  Moderate to severe multilevel degenerative changes.    Dg Hip Complete Right  Minimally displaced fracture involving the right greater trochanter.  No definite intertrochanteric extension.     Ct Hip Right Wo Contrast 02/18/2012  Essentially nondisplaced fracture in the lateral aspect of the right greater trochanter.    CT of the brain   02/18/2012 2.6 x 2.4 cm intraparenchymal hematoma in the subcortical right frontal lobe, slightly increased in size as above.  Mild surrounding vasogenic edema is similar. 02/18/2012 2.4 x 1.9 cm intraparenchymal hematoma in the subcortical right frontal lobe.   Mild surrounding vasogenic edema.  Very mild soft tissue swelling overlying the right frontal bone. No underlying calvarial fracture  MRI of the brain    MRA of the brain    2D Echocardiogram    Carotid Doppler  No internal carotid artery stenosis bilaterally. Vertebrals with antegrade flow bilaterally.   CXR  02/18/2012  No evidence of acute cardiopulmonary disease.  Original Report Authenticated By: Charline Bills, M.D.   EKG  unchanged from previous tracings, normal sinus rhythm with short PR.   Therapy Recommendations PT -, OT -  Physical Exam  Pleasant elderly African Americanmale currently not in distress.Awake alert. Afebrile. Head is nontraumatic. Neck is supple without bruit. Hearing is normal. Cardiac exam no murmur or gallop. Lungs are clear to auscultation. Distal pulses are well felt.   Neurological Exam : Awake  Alert oriented x 3. Normal speech and language.eye movements full without nystagmus.fundi not visualized. Visual fields appear normal. Face symmetric. Tongue midline. Normal strength, tone, reflexes and coordination.minimally diminished fine finger movements on the left. Orbits right over left approximately. Normal sensation. Gait deferred.  ASSESSMENT [name] is a 78 y.o. male with a  subcortical right frontal lobe intraparenchymal hematoma with mild surrounding vasogenic edema. Hemorrhage led to a fall, causing a right greater trochanter fracture. Hemorrhage occurred while on xarelto for atrial fibrillation in the setting of normal BP.  Reversed with FEIBA protocol. Patient with resultant mild left hemiparesis from the stroke.On xarelto prior to admission. Now on no antiplatelet for secondary stroke prevention given hemorrhage; no longer an anticoagulant candidate secondary to ICH. No role for OR at this time.  -elevated INR 3.98 on admission while on xarelto. Likely unrelated. INR down to 1.96 after reversal. Pharmacist to follow up for possible xarelto  "poisoning"  -right trochanter fx. Ortho consult pending  Hospital day # 1  TREATMENT/PLAN -MRI/MRA of the brain to further define neuro insult -OOB with therapy evals if ok with ortho -aspirin 81 mg in the future for secondary stroke prevention. Will want ICH stabilzed per CT prior to resuming. We will follow. -f/u LFTs given elevated INR -strict control of hypertension to prevent rebleeding. This patient is critically ill and at significant risk of neurological worsening, death and care requires constant monitoring of vital signs, hemodynamics,respiratory and cardiac monitoring,review of multiple databases, neurological assessment, discussion with family, other specialists and medical decision making of high complexity. I spent 30 minutes of neurocritical care time  in the care of  this patient.  Joaquin Music, ANP-BC, GNP-BC [facility] Pager: [phone] 02/19/2012 8:12 AM  Dr. Delia Heady, Stroke Center Medical Director, has personally reviewed chart, pertinent data, examined the patient and developed the plan of care. Pager:  [phone]

## 2012-02-19 NOTE — Progress Notes (Signed)
VASCULAR LAB PRELIMINARY  PRELIMINARY  PRELIMINARY  PRELIMINARY  Carotid duplex  completed.    Preliminary report:  Bilateral:  No evidence of hemodynamically significant internal carotid artery stenosis.   Vertebral artery flow is antegrade.      [name], RVT 02/19/2012, 12:37 PM

## 2012-02-19 NOTE — Progress Notes (Signed)
Chart reviewed.  OT/PT cancelled today due to pt. On bedrest.  Will initiate evals once activity level increased.  Jeani Hawking, OTR/L [phone]

## 2012-02-20 ENCOUNTER — Encounter (HOSPITAL_COMMUNITY): Payer: Self-pay | Admitting: Physical Medicine and Rehabilitation

## 2012-02-20 DIAGNOSIS — I619 Nontraumatic intracerebral hemorrhage, unspecified: Secondary | ICD-10-CM

## 2012-02-20 LAB — BASIC METABOLIC PANEL
BUN: 15 mg/dL (ref 6–23)
Chloride: 106 mEq/L (ref 96–112)
GFR calc Af Amer: 79 mL/min — ABNORMAL LOW (ref >90)
GFR calc non Af Amer: 68 mL/min — ABNORMAL LOW (ref >90)
Potassium: 3.9 mEq/L (ref 3.5–5.1)
Sodium: 138 mEq/L (ref 135–145)

## 2012-02-20 LAB — CBC WITH DIFFERENTIAL/PLATELET
Basophils Relative: 0 % (ref 0–1)
HCT: 32 % — ABNORMAL LOW (ref 39.0–52.0)
Hemoglobin: 10.8 g/dL — ABNORMAL LOW (ref 13.0–17.0)
Lymphocytes Relative: 11 % — ABNORMAL LOW (ref 12–46)
MCHC: 33.8 g/dL (ref 30.0–36.0)
Monocytes Relative: 8 % (ref 3–12)
Neutro Abs: 6.6 10*3/uL (ref 1.7–7.7)
Neutrophils Relative %: 79 % — ABNORMAL HIGH (ref 43–77)
RBC: 3.38 MIL/uL — ABNORMAL LOW (ref 4.22–5.81)
WBC: 8.3 10*3/uL (ref 4.0–10.5)

## 2012-02-20 MED ORDER — FUROSEMIDE 10 MG/ML IJ SOLN: 10.0000 mg | Freq: Once | INTRAMUSCULAR | Status: DC

## 2012-02-20 NOTE — Progress Notes (Signed)
Stroke Team Progress Note  HISTORY [name] is an 78 y.o. male who lives alone and fell at some time between 6pm yesterday and 1 am this am. Patient was found by family members this AM after he called his daughter telling her he had fallen. On arrival to the ED he complained of hip pain only. CT head showed 2.4 x 1.9 cm intraparenchymal hematoma in the subcortical right frontal lobe. Patient is on Xeralto for A-fib at time of arrival. Currently patient has been taken off all antiplatelets and anticoagulants. Oral anticoagulation protocol has been initiated. Neurology asked to consult. Patient was not a TPA candidate secondary to hemorrhage. He was admitted to the neuro ICU for further evaluation and treatment.  SUBJECTIVE No family is at the bedside. He states he is feeling better.sitting up in bed eating breakfast.  OBJECTIVE Most recent Vital Signs: Filed Vitals:   02/20/12 0400 02/20/12 0500 02/20/12 0600 02/20/12 0705  BP: 138/86 149/56 137/67 152/69  Pulse: 73 69 70 74  Temp: 99.7 F (37.6 C)     TempSrc:      Resp: 18 13 23 22   Height:      Weight:  84.6 kg (186 lb 8.2 oz)    SpO2: 97% 99% 98% 96%   Intake/Output from previous day: 07/30 0701 - 07/31 0700 In: 1898.3 [P.O.:555; I.V.:1343.3] Out: 580 [Urine:580]  IV Fluid Intake:     . sodium chloride 50 mL/hr at 02/20/12 0600   MEDICATIONS    . amiodarone  200 mg Oral Daily  . atorvastatin  20 mg Oral q1800  . irbesartan  150 mg Oral Daily  . pantoprazole  40 mg Oral Q1200  . sodium chloride  3 mL Intravenous Q12H   PRN:  acetaminophen, acetaminophen, ondansetron (ZOFRAN) IV, ondansetron  Diet:  Cardiac thin liquids Activity:  WBAT DVT Prophylaxis:  SCDs   CLINICALLY SIGNIFICANT STUDIES Basic Metabolic Panel:  Lab 02/20/12 8657 02/19/12 0657  NA 138 138  K 3.9 3.8  CL 106 105  CO2 25 25  GLUCOSE 108* 103*  BUN 15 18  CREATININE 1.03 1.17  CALCIUM 8.2* 8.3*  MG -- --  PHOS -- --   Liver Function  Tests:  Lab 02/19/12 0657  AST 40*  ALT 14  ALKPHOS 60  BILITOT 1.2  PROT 5.9*  ALBUMIN 2.8*   CBC:  Lab 02/20/12 0512 02/19/12 1909  WBC 8.3 7.4  NEUTROABS 6.6 6.2  HGB 10.8* 10.9*  HCT 32.0* 32.4*  MCV 94.7 95.3  PLT 81* 90*   Coagulation:  Lab 02/19/12 0657 02/19/12 0011 02/18/12 1013  LABPROT 22.7* 25.6* 39.4*  INR 1.96* 2.29* 3.98*   Cardiac Enzymes:  Lab 02/19/12 0657 02/18/12 2210 02/18/12 1515  CKTOTAL 1877* 1678* 1697*  CKMB 2.4 3.2 4.6*  CKMBINDEX -- -- --  TROPONINI <0.30 <0.30 <0.30   Urinalysis:  Lab 02/18/12 1509  COLORURINE YELLOW  LABSPEC 1.024  PHURINE 6.0  GLUCOSEU NEGATIVE  HGBUR MODERATE*  BILIRUBINUR NEGATIVE  KETONESUR 15*  PROTEINUR 30*  UROBILINOGEN 1.0  NITRITE NEGATIVE  LEUKOCYTESUR NEGATIVE   Lipid Panel     Component Value Date/Time   CHOL 125 02/19/2012 0657   TRIG 54 02/19/2012 0657   HDL 57 02/19/2012 0657   CHOLHDL 2.2 02/19/2012 0657   VLDL 11 02/19/2012 0657   LDLCALC 57 02/19/2012 0657   HgbA1C  Lab Results  Component Value Date   HGBA1C 5.5 02/18/2012   Urine Drug Screen:   No results  found for this basename: labopia,  cocainscrnur,  labbenz,  amphetmu,  thcu,  labbarb    Alcohol Level: No results found for this basename: ETH:2 in the last 168 hours  Dg Lumbar Spine Complete 02/18/2012  No fracture or dislocation is seen.  Moderate to severe multilevel degenerative changes.    Dg Hip Complete Right  Minimally displaced fracture involving the right greater trochanter.  No definite intertrochanteric extension.     Ct Hip Right Wo Contrast 02/18/2012  Essentially nondisplaced fracture in the lateral aspect of the right greater trochanter.    CT of the brain   02/18/2012 2.6 x 2.4 cm intraparenchymal hematoma in the subcortical right frontal lobe, slightly increased in size as above.  Mild surrounding vasogenic edema is similar. 02/18/2012 2.4 x 1.9 cm intraparenchymal hematoma in the subcortical right frontal lobe.  Mild  surrounding vasogenic edema.  Very mild soft tissue swelling overlying the right frontal bone. No underlying calvarial fracture  MRI of the brain  02/19/2012  1.  Stable appearance of a 2.6 cm right frontal opercular hemorrhage and surrounding edema. 2.  Focus of remote blood products in the right cerebellum. 3.  No other acute hemorrhage. 4.  Age advanced atrophy and moderate white matter disease.  This is nonspecific, but likely reflects the sequelae of chronic microvascular ischemia.   MRA of the brain  02/19/2012     1.  Mild tortuosity of the distal cervical internal carotid arteries, more prominent on the left.  This can be seen in the setting of hypertension. 2.  No significant proximal stenosis, aneurysm, or branch vessel occlusion. 3.  No significant vascular malformation associated with hemorrhage. 4.  Tapering of distal branch vessels, compatible with mild small vessel disease.   2D Echocardiogram  EF 55-60% with no source of embolus.   Carotid Doppler  No internal carotid artery stenosis bilaterally. Vertebrals with antegrade flow bilaterally.   CXR  02/18/2012  No evidence of acute cardiopulmonary disease.  Original Report Authenticated By: Charline Bills, M.D.   EKG  unchanged from previous tracings, normal sinus rhythm with short PR.   Therapy Recommendations PT -CIR, OT -  Physical Exam  Pleasant elderly African Americanmale currently not in distress.Awake alert. Afebrile. Head is nontraumatic. Neck is supple without bruit. Hearing is normal. Cardiac exam no murmur or gallop. Lungs are clear to auscultation. Distal pulses are well felt.   Neurological Exam : Awake  Alert oriented x 3. Normal speech and language.eye movements full without nystagmus.fundi not visualized. Visual fields appear normal. Face symmetric. Tongue midline. Normal strength, tone, reflexes and coordination.minimally diminished fine finger movements on the left. Orbits right over left approximately. Normal  sensation. Gait deferred.   ASSESSMENT Mr. [name] is a 78 y.o. male with a  subcortical right frontal lobe intraparenchymal hematoma with mild surrounding vasogenic edema. Hemorrhage led to a fall, causing a right greater trochanter fracture. Hemorrhage occurred while on xarelto for atrial fibrillation in the setting of normal BP.  Reversed with FEIBA protocol. Patient with resultant mild left hemiparesis from the stroke.On xarelto prior to admission. Now on no antiplatelet for secondary stroke prevention given hemorrhage; no longer an anticoagulant candidate secondary to ICH. No role for OR at this time.  -elevated INR 3.98 on admission while on xarelto. Likely unrelated. INR down to 1.96 after reversal. Pharmacist to follow up for possible xarelto "poisoning"  -right trochanter fx. Ortho recommends no surgical intervention. Can be wt bearing but should use walker for 4-6  wks.  Hospital day # 2  TREATMENT/PLAN -rehab consult -aspirin 81 mg in the future for secondary stroke prevention. Will want ICH stabilzed per CT prior to resuming. We will follow. -transfer to the floor  Dr. Pearlean Brownie discussed with DR. Roberto Scales BIBY, AVNP, ANP-BC, GNP-BC [facility] Pager: [phone] 02/20/2012 9:31 AM  Dr. Delia Heady, Stroke Center Medical Director, has personally reviewed chart, pertinent data, examined the patient and developed the plan of care. Pager:  [phone]

## 2012-02-20 NOTE — Progress Notes (Signed)
Report from 3100 from nurse Consuella Lose.

## 2012-02-20 NOTE — Evaluation (Signed)
Occupational Therapy Evaluation Patient Details Name: [name] MRN: [id number] DOB: 1933/04/22 Today's Date: 02/20/2012 Time: 4098-1191 OT Time Calculation (min): 36 min  OT Assessment / Plan / Recommendation Clinical Impression  This 78 y.o. male admitted after experiencing IPH and subsequent fall resulting in Rt. hip fracture (pt WBAT and no active abduction).  Pt. demonstrates significant cognitive deficits including impaired memory, impaired attention, possible motor planning deficits, poor safety awareness and poor awareness of deficits.  Pt. also with impaired visual saccades and pursuits likely due to impaired visual attention.  Pt. currently requires max to total A with LB ADLs and functional mobility.  If family able to provide 24 hour assist at discharge, recommend CIR; if not he may require SNF    OT Assessment  Patient needs continued OT Services    Follow Up Recommendations  Inpatient Rehab;Supervision/Assistance - 24 hour    Barriers to Discharge Decreased caregiver support unsure of family support  Equipment Recommendations  Defer to next venue    Recommendations for Other Services    Frequency  Min 3X/week    Precautions / Restrictions Precautions Precautions: Fall;Other (comment) Precaution Comments: No active hip abduction of the RLE Restrictions Weight Bearing Restrictions: No RLE Weight Bearing: Weight bearing as tolerated   Pertinent Vitals/Pain     ADL  Eating/Feeding: Simulated;Independent Where Assessed - Eating/Feeding: Bed level Grooming: Simulated;Wash/dry hands;Wash/dry face;Teeth care;Minimal assistance Where Assessed - Grooming: Supported sitting Upper Body Bathing: Simulated;Minimal assistance Where Assessed - Upper Body Bathing: Supported sitting Lower Body Bathing: Simulated;Maximal assistance Where Assessed - Lower Body Bathing: Supported sit to stand Upper Body Dressing: Simulated;Minimal assistance Where Assessed - Upper Body  Dressing: Unsupported sitting Lower Body Dressing: Simulated;+1 Total assistance Where Assessed - Lower Body Dressing: Supported sit to Pharmacist, hospital: Simulated;+2 Total assistance (to transfer to recliner) Toilet Transfer: Patient Percentage: 50% Statistician Method: Surveyor, minerals: Materials engineer and Hygiene: Simulated;+1 Total assistance Where Assessed - Engineer, mining and Hygiene: Standing Equipment Used: Rolling walker;Gait belt Transfers/Ambulation Related to ADLs: Pt. requires total A +2.  Pt. performed ~50% for sit to stand and ~70% to pivot or ambulate short distance.  See comments below ADL Comments: Pt. will require AE for LB ADLs due to no active hip abduction on Rt.      OT Diagnosis: Generalized weakness;Disturbance of vision;Cognitive deficits  OT Problem List: Decreased strength;Decreased activity tolerance;Impaired balance (sitting and/or standing);Impaired vision/perception;Decreased cognition;Decreased safety awareness;Decreased knowledge of use of DME or AE OT Treatment Interventions: Self-care/ADL training;Neuromuscular education;DME and/or AE instruction;Therapeutic activities;Cognitive remediation/compensation;Visual/perceptual remediation/compensation;Patient/family education;Balance training   OT Goals Acute Rehab OT Goals OT Goal Formulation: With patient Time For Goal Achievement: 03/05/12 Potential to Achieve Goals: Good ADL Goals Pt Will Perform Grooming: with min assist;Supported;Standing at sink ADL Goal: Grooming - Progress: Goal set today Pt Will Perform Upper Body Bathing: with supervision;Sitting, chair ADL Goal: Upper Body Bathing - Progress: Goal set today Pt Will Perform Lower Body Bathing: with min assist;Sit to stand from chair (with AE) ADL Goal: Lower Body Bathing - Progress: Goal set today Pt Will Perform Lower Body Dressing: with min assist;with adaptive  equipment;Sit to stand from chair;Sit to stand from bed ADL Goal: Lower Body Dressing - Progress: Goal set today Pt Will Transfer to Toilet: with min assist;Comfort height toilet;3-in-1;Ambulation ADL Goal: Toilet Transfer - Progress: Goal set today Additional ADL Goal #1: Pt will maintain sustained attention for  5 mins. with min verbal cues ADL Goal:  Additional Goal #1 - Progress: Goal set today  Visit Information  Last OT Received On: 02/20/12 Assistance Needed: +2    Subjective Data  Subjective: "No it doesn't hurt" re: hip (provided inconsistent responses - did acknowlege he does experience discomfort Patient Stated Goal: Pt. did not state   Prior Functioning  Vision/Perception  Home Living Lives With: Alone Available Help at Discharge: Friend(s);Family;Other (Comment) Type of Home: House Home Access: Stairs to enter [employer] of Steps: 3 Entrance Stairs-Rails: None Home Layout: One level Bathroom Shower/Tub: Tub/shower unit;Walk-in shower Bathroom Toilet: Standard Bathroom Accessibility: Yes How Accessible: Accessible via walker Home Adaptive Equipment: Grab bars in shower;Straight cane Prior Function Level of Independence: Independent with assistive device(s) Driving: Yes Vocation: Retired Musician: No difficulties Dominant Hand: Right   Vision - Assessment Eye Alignment: Within Functional Limits Vision Assessment: Vision tested Ocular Range of Motion: Within Functional Limits Tracking/Visual Pursuits: Requires cues, head turns, or add eye shifts to track Saccades: Additional eye shifts occurred during testing;Undershoots (appears to lose attention) Convergence: Impaired (comment) Additional Comments: Pt. able to maintain visual attention for only 6 seconds (impaired).  Pt. loses fixation during pursuits frequently.  Pt. appears to become distracted during visual testing during both saccades and pursuits and then will re-attempt to  reengage, then lose attention Perception Perception: Within Functional Limits Praxis Praxis: Impaired Praxis Impairment Details: Motor planning (possible - difficult to determine accurately due to cognitiv)  Cognition  Overall Cognitive Status: Impaired Area of Impairment: Attention;Following commands;Safety/judgement;Awareness of deficits;Problem solving;Memory Arousal/Alertness: Awake/alert Orientation Level: Disoriented to;Time Behavior During Session: Flat affect Current Attention Level: Sustained Attention - Other Comments: difficulty maintaining attention to task (? due to pain during sit to stand--pt "freezes" in semi-standing position and requires incr cues and assist to complete task and move hands to the next surface)  (see also comments under vision) Memory Deficits: Pt. with no recall that therapists had transferred pt. to chair after 3 mins, and was requesting to return to bed Following Commands: Follows one step commands inconsistently (possibly related to impaired attention) Safety/Judgement: Decreased awareness of safety precautions;Impulsive;Decreased awareness of need for assistance Safety/Judgement - Other Comments: Pt verbalized understanding of need to call for assist prior to getting up and demonstrated use of call bell, yet after 5 minutes in the chair, nursing found him scooted out to edge of seat with feet on floor (over elevated legrest of recliner) and attempt to stand. When asked how he was going to get back to bed when it took 2 person heavy assist to get OOB, he replied "Oh, I'd have figured a way" Cognition - Other Comments: Pt. attempted to use telemetry box as his remote with no awareness.      Extremity/Trunk Assessment Right Upper Extremity Assessment RUE ROM/Strength/Tone: Within functional levels RUE Coordination: WFL - gross/fine motor Left Upper Extremity Assessment LUE ROM/Strength/Tone: Within functional levels LUE Coordination: WFL - gross/fine  motor Trunk Assessment Trunk Assessment: Normal   Mobility Bed Mobility Bed Mobility: Supine to Sit;Sitting - Scoot to Edge of Bed Supine to Sit: 3: Mod assist;HOB elevated Sitting - Scoot to Edge of Bed: 5: Supervision Sit to Supine: 1: +2 Total assist;HOB flat Sit to Supine: Patient Percentage: 60% Details for Bed Mobility Assistance: vc for technique and sequencing; pt able to initiate some parts of task immediately, and then would get "lost" and need cues to get back on track Transfers Transfers: Sit to Stand;Stand to Sit Sit to Stand: 1: +2 Total assist;From elevated surface;With upper  extremity assist;From bed;With armrests;From chair/3-in-1 Sit to Stand: Patient Percentage: 50% Stand to Sit: 1: +2 Total assist;With upper extremity assist;To elevated surface;To bed;To chair/3-in-1 Stand to Sit: Patient Percentage: 50% Details for Transfer Assistance: Pt initiated transfer with repeated cues for safe hand placement, then as he pushed up would achieve ~1/2 standing and freeze (? pain; ?motor planning, ?attention, ?unsure how to transition his hands from surface to RW)   Exercise    Balance Balance Balance Assessed: Yes Static Standing Balance Static Standing - Balance Support: Bilateral upper extremity supported Static Standing - Level of Assistance: 1: +2 Total assist;Patient percentage (comment) Static Standing - Comment/# of Minutes: Pt continues with lean to Left on initial standing with seemingly no awareness  End of Session OT - End of Session Equipment Utilized During Treatment: Gait belt Activity Tolerance: Patient tolerated treatment well Patient left: in chair;with call bell/phone within reach;with nursing in room Nurse Communication: Mobility status;Need for lift equipment  GO     Berenis Corter, Ursula Alert M 02/20/2012, 6:51 PM

## 2012-02-20 NOTE — Progress Notes (Signed)
Subjective: Doing well.  No r. Hip pain   Objective: Vital signs in last 24 hours: Temp:  [98.2 F (36.8 C)-99.7 F (37.6 C)] 99.4 F (37.4 C) (07/31 1200) Pulse Rate:  [49-80] 66  (07/31 1200) Resp:  [13-23] 18  (07/31 1200) BP: (102-152)/(33-103) 144/65 mmHg (07/31 1200) SpO2:  [96 %-100 %] 99 % (07/31 1200) Weight:  [84.6 kg (186 lb 8.2 oz)] 84.6 kg (186 lb 8.2 oz) (07/31 1200)  Intake/Output from previous day: 07/30 0701 - 07/31 0700 In: 1898.3 [P.O.:555; I.V.:1343.3] Out: 580 [Urine:580] Intake/Output this shift: Total I/O In: 360 [P.O.:360] Out: -    Basename 02/20/12 0512 02/19/12 1909 02/19/12 0657 02/19/12 0011 02/18/12 1840  HGB 10.8* 10.9* 10.1* 10.5* 12.5*    Basename 02/20/12 0512 02/19/12 1909  WBC 8.3 7.4  RBC 3.38* 3.40*  HCT 32.0* 32.4*  PLT 81* 90*    Basename 02/20/12 0512 02/19/12 0657  NA 138 138  K 3.9 3.8  CL 106 105  CO2 25 25  BUN 15 18  CREATININE 1.03 1.17  GLUCOSE 108* 103*  CALCIUM 8.2* 8.3*    Basename 02/19/12 0657 02/19/12 0011  LABPT -- --  INR 1.96* 2.29*    Neurologically intact ABD soft Neurovascular intact Sensation intact distally Intact pulses distally Dorsiflexion/Plantar flexion intact Compartment soft no pain standing or with ROM r. hip  Assessment/Plan: Greater trochanter fx R. Side w/ min displacement// WBAT and no active abduction  Will sign off f/u in office 3 weeks   [name] L 02/20/2012, 1:54 PM

## 2012-02-20 NOTE — Progress Notes (Signed)
I will follow after OT evaluation and my discussion with family of rehab goals to assist in determining rehab venue options. CIR vs SNF. Please call me with questions. [phone]

## 2012-02-20 NOTE — Progress Notes (Signed)
Patient ID: [name], male   DOB: Feb 25, 1933, 78 y.o.   MRN: [id number] BP 144/65  Pulse 66  Temp 99.4 F (37.4 C) (Oral)  Resp 18  Ht 6\' 2"  (1.88 m)  Wt 84.6 kg (186 lb 8.2 oz)  BMI 23.95 kg/m2  SpO2 99% Alert and oriented to person, place, time. Following commands Moving all extremities, weak in right lower extremity Stable exam.  Will sign off, no surgical intervention necessary. Call if questions.

## 2012-02-20 NOTE — Consult Note (Signed)
Name: [name] MRN: [id number] DOB: 11-05-1932    LOS: 2  Referring Provider:  [facility] Reason for Referral:  ICB  PULMONARY / CRITICAL CARE MEDICINE  Brief patient description:  78 y/o with atrial fibrillation on Xarelto admitted after fall at home without loss of consciousness resulting in right hip fracture and intracranial hemorrhage.  Transferred to ICU for implementation of anticoagulation reversal protocol.  Events Since Admission: 7/29  Admitted after fall with right hip fracture and intracranial hemorrhage.  Anticoagulation reversal protocol began. 7/30 MRi brain>>>Stable appearance of a 2.6 cm right frontal opercular  hemorrhage and surrounding edema   Current Status: No distress, appears well  Vital Signs: Temp:  [98 F (36.7 C)-99.7 F (37.6 C)] 99.7 F (37.6 C) (07/31 0400) Pulse Rate:  [49-86] 74  (07/31 0705) Resp:  [13-23] 22  (07/31 0705) BP: (103-152)/(33-101) 152/69 mmHg (07/31 0705) SpO2:  [89 %-100 %] 96 % (07/31 0705) Weight:  [84.6 kg (186 lb 8.2 oz)] 84.6 kg (186 lb 8.2 oz) (07/31 0500)  Physical Examination: General:  No distress Neuro:  Awake, alert, cooperative, nonfocal HEENT:  PERRL Neck:  Supple Cardiovascular:  Regular, no murmurs Lungs:  Clear lungs Abdomen:  Soft, nontender Musculoskeletal:  R hip tenderness improved Skin:  Intact  Principal Problem:  *Intracerebral bleed Active Problems:  Hypertension  Thrombocytopenia  A-fib  Osteoarthritis  Fall from bed  Hip pain, right  Hip fracture, right  ASSESSMENT AND PLAN  PULMONARY No results found for this basename: PHART:5,PCO2:5,PCO2ART:5,PO2ART:5,HCO3:5,O2SAT:5 in the last 168 hours Ventilator Settings:   CXR:  NA ETT:  NA  A:  No active issues. P:   pcxr old reviewed IS as remain nonmobile  CARDIOVASCULAR  Lab 02/19/12 0657 02/18/12 2210 02/18/12 1515  TROPONINI <0.30 <0.30 <0.30  LATICACIDVEN -- -- --  PROBNP -- -- --   ECG:  7/29 >>> Sinus rhythm, left  anterior fascicular block.  Lines: NA  A: History of atrial fibrillation.  Dyslipidemia.  Hypertension. P:  Telemetry Continue preadmission RX Keep normotensive, avoid hypotension Keep MAP 65-70 as he has clinically done well  RENAL  Lab 02/20/12 0512 02/19/12 0657 02/18/12 1013  NA 138 138 141  K 3.9 3.8 --  CL 106 105 102  CO2 25 25 29   BUN 15 18 18   CREATININE 1.03 1.17 1.34  CALCIUM 8.2* 8.3* 9.4  MG -- -- --  PHOS -- -- --   Intake/Output      07/30 0701 - 07/31 0700 07/31 0701 - 08/01 0700   P.O. 555    I.V. (mL/kg) 1343.3 (15.9)    Blood     Total Intake(mL/kg) 1898.3 (22.4)    Urine (mL/kg/hr) 580 (0.3)    Total Output 580    Net +1318.3         Urine Occurrence 3 x     Foley:  7/29 >>>  A:  No active issues. P:   Diet Avoid free water kvo Cr on ARB in am   GASTROINTESTINAL  Lab 02/19/12 0657  AST 40*  ALT 14  ALKPHOS 60  BILITOT 1.2  PROT 5.9*  ALBUMIN 2.8*    A:  No active issues. P:   Heart healthy doet ppi as neuro stress risk  HEMATOLOGIC  Lab 02/20/12 0512 02/19/12 1909 02/19/12 0657 02/19/12 0011 02/18/12 1840 02/18/12 1515 02/18/12 1013  HGB 10.8* 10.9* 10.1* 10.5* 12.5* -- --  HCT 32.0* 32.4* 29.6* 32.2* 37.7* -- --  PLT 81* 90* 90* 100* 72* -- --  INR -- -- 1.96* 2.29* -- -- 3.98*  APTT -- -- -- -- -- 41* --   A:  Xarelto induced coagulopathy.  Thrombocytopenia, preadmission. P:  Given FEIBA 7/29 Platelets transfused per Neurosurgery 7/29 Excellent clinical status and MRI reviewed, agree liit further Tx unless clinically declines or plat less 50 k  scd Cbc in am again  Etiology of plat?, consider liver US, consider peripheral smear May need lasix for dilutional affect on plat  INFECTIOUS  Lab 02/20/12 0512 02/19/12 1909 02/19/12 0657 02/19/12 0011 02/18/12 1840  WBC 8.3 7.4 7.7 7.9 9.6  PROCALCITON -- -- -- -- --   Cultures: NA Antibiotics: NA  A:  No active issues. P:   No intervention  required  ENDOCRINE No results found for this basename: GLUCAP:5 in the last 168 hours A:  No active issues. P:   No intervention required  NEUROLOGIC  Head CT:  7/29 >>> 2.4 x 1.9 cm intraparenchymal hematoma in the subcortical right frontal lobe. Mild surrounding vasogenic edema.  Very mild soft tissue swelling overlying the right frontal bone. No underlying calvarial fracture. 7/30 MRI brain>>>Stable appearance of a 2.6 cm right frontal opercular  hemorrhage and surrounding edema  A:  Small asymptomatic intraparenchymal right frontal lobe hemorrhage.  P:   Neurosurgery following - no indication for surgery Neurology following, MRI reviewed kvo Lasix x 1 Plat follow up in am   OTHER A:  Right hip fracture P: Appreciate ortho recs and follow upneeds in 4 weeks  BEST PRACTICE / DISPOSITION Level of Care:  ICU to floor tele Primary Service:  PCCM, back to [facility] now Consultants:  Neurosurgery, Neurology, Ortho Code Status:  Full Diet:  Regular DVT Px:  SCDs GI Px:  Protonix Skin Integrity:  Intact Social / Family:  Not available  Nelda Bucks., M.D. Pulmonary and Critical Care Medicine [facility] Pager: [phone]  02/20/2012, 9:51 AM

## 2012-02-20 NOTE — Consult Note (Signed)
Physical Medicine and Rehabilitation Consult Reason for Consult: fall with left frontal hemorrhage  Referring Physician: Dr. Pearlean Brownie    HPI: [name] is a 78 y.o. male with history of A Fib-on chronic Xarelto, OA, chronic RLE weakness, who fell  evening prior to admission and found by family on  02/18/12 with complaints of hip pain.  Work up done revealed 2.4 X 1.9 cm IPH in right frontal lobe with mild edema and nondisplaced fracture of greater right trochanter.  Patient noted to be confused but alert. Oral anticoagulation reversal protocol initiated. INR 3.978 at admission.  Evaluated by Dr. Franky Macho who recommended felt no surgical intervention needed. Dr Luiz Blare Dr. Charlann Boxer recommended WBAT with no abduction right hip and follow up on outpatient basis. Neurology consulted and recommends ASA for secondary prevention once ICH stabilized per CCT.  F/U MRI 7/30 revealed stable appearance of right frontal opercular hemorrhage and edema. 2 D echo with EF 65-70% and severely dilated right atrium. PT evaluation done yesterday. MD, PT recommending CIR.   Review of Systems  HENT: Negative for hearing loss.   Eyes: Negative for blurred vision and double vision.  Respiratory: Negative for cough and shortness of breath.   Cardiovascular: Negative for chest pain and palpitations.  Gastrointestinal: Positive for constipation.  Genitourinary: Negative for urgency and frequency.       Incontinent currently.   Neurological: Positive for focal weakness (chronic RLE weakness.). Negative for dizziness and headaches.  Psychiatric/Behavioral: The patient does not have insomnia.    Past Medical History  Diagnosis Date  . Hypertension   . Anemia   . Thrombocytopenia   . A-fib   . Peripheral vascular disease   . Fatigue   . Osteoarthritis   . Hyperlipidemia    Past Surgical History  Procedure Date  . Neck surgery    Family History  Problem Relation Age of Onset  . Alzheimer's disease Mother 73  .  Cancer Father 64    prostate cancer  . Cancer Brother     unknown cancer   Social History: Lives alone.  Retired Naval architect.     reports that he has never smoked. He does not have any smokeless tobacco history on file. He reports that he does not drink alcohol or use illicit drugs.   Allergies  Allergen Reactions  . Lisinopril     Patient states it causes runny nose, itchiness, and allergy-type symptoms.   Medications Prior to Admission  Medication Sig Dispense Refill  . amiodarone (PACERONE) 200 MG tablet Take 200 mg by mouth daily.        Marland Kitchen atorvastatin (LIPITOR) 20 MG tablet Take 20 mg by mouth daily.        . furosemide (LASIX) 20 MG tablet Take 20 mg by mouth daily.       . irbesartan (AVAPRO) 150 MG tablet Take 150 mg by mouth at bedtime.      . potassium chloride SA (K-DUR,KLOR-CON) 20 MEQ tablet Take 20 mEq by mouth daily.        . Rivaroxaban (XARELTO) 20 MG TABS Take 20 mg by mouth daily.        Home: Home Living Lives With: Alone Available Help at Discharge: Friend(s);Family;Other (Comment) Type of Home: House Home Access: Stairs to enter [employer] of Steps: 3 Entrance Stairs-Rails: None Home Layout: One level Bathroom Shower/Tub: Tub/shower unit;Walk-in shower Bathroom Toilet: Standard Home Adaptive Equipment: Grab bars in shower;Straight cane  Functional History: Prior Function Driving: Yes Functional Status:  Mobility: Bed Mobility Bed Mobility: Supine to Sit;Sitting - Scoot to [place] of Bed;Sit to Supine Supine to Sit: 3: Mod assist Sitting - Scoot to Edge of Bed: 4: Min guard Sit to Supine: 1: +2 Total assist;HOB flat Sit to Supine: Patient Percentage: 60% Transfers Transfers: Sit to Stand;Stand to Sit Sit to Stand: 1: +2 Total assist;With upper extremity assist;From bed Sit to Stand: Patient Percentage: 50% Stand to Sit: 1: +2 Total assist;To bed Stand to Sit: Patient Percentage: 50% Ambulation/Gait Ambulation/Gait Assistance: Not  tested (comment) Stairs: No Wheelchair Mobility Wheelchair Mobility: No  ADL:    Cognition: Cognition Arousal/Alertness: Awake/alert Orientation Level: Oriented X4 Cognition Overall Cognitive Status: Appears within functional limits for tasks assessed/performed Arousal/Alertness: Awake/alert Orientation Level: Appears intact for tasks assessed Behavior During Session: [facility] for tasks performed  Blood pressure 152/69, pulse 74, temperature 99.7 F (37.6 C), temperature source Oral, resp. rate 22, height 6\' 2"  (1.88 m), weight 84.6 kg (186 lb 8.2 oz), SpO2 96.00%. Physical Exam  Nursing note and vitals reviewed. Constitutional: He appears well-developed and well-nourished.       Keeps eyes closed.  Strong odor of urine.   HENT:  Head: Normocephalic and atraumatic.       Small laceration right forehead.   Eyes: Pupils are equal, round, and reactive to light.  Cardiovascular: Normal rate and regular rhythm.   Pulmonary/Chest: Effort normal and breath sounds normal.  Abdominal: Soft. Bowel sounds are normal.  Musculoskeletal: He exhibits tenderness (right knee with ROM due to stiffness). He exhibits no edema.  Neurological: He is alert.       Oriented to self and place.  Situation "I had a stroke"  Follows basic commands without difficulty.   no pain with range of motion in the right lower extremity. Motor strength is 5/5 in bilateral deltoid, biceps, triceps, grip as well as left hip flexor knee extensor ankle dorsiflexor 2 minus over 5 in the right hip flexor knee extensor ankle dorsiflexor and plantar flexor. There is no evidence of spasticity in the upper or lower extremities There is decreased fine motor control and finger thumb opposition bilateral upper extremity Mild dysmetria on finger-nose-finger testing in bilateral upper extremity.  Results for orders placed during the hospital encounter of 02/18/12 (from the past 24 hour(s))  CBC WITH DIFFERENTIAL     Status: Abnormal     Collection Time   02/19/12  7:09 PM      Component Value Range   WBC 7.4  4.0 - 10.5 K/uL   RBC 3.40 (*) 4.22 - 5.81 MIL/uL   Hemoglobin 10.9 (*) 13.0 - 17.0 g/dL   HCT 60.7 (*) 37.1 - 06.2 %   MCV 95.3  78.0 - 100.0 fL   MCH 32.1  26.0 - 34.0 pg   MCHC 33.6  30.0 - 36.0 g/dL   RDW 69.4  85.4 - 62.7 %   Platelets 90 (*) 150 - 400 K/uL   Neutrophils Relative 83 (*) 43 - 77 %   Neutro Abs 6.2  1.7 - 7.7 K/uL   Lymphocytes Relative 9 (*) 12 - 46 %   Lymphs Abs 0.7  0.7 - 4.0 K/uL   Monocytes Relative 6  3 - 12 %   Monocytes Absolute 0.4  0.1 - 1.0 K/uL   Eosinophils Relative 2  0 - 5 %   Eosinophils Absolute 0.1  0.0 - 0.7 K/uL   Basophils Relative 0  0 - 1 %   Basophils Absolute 0.0  0.0 -  0.1 K/uL  BASIC METABOLIC PANEL     Status: Abnormal   Collection Time   02/20/12  5:12 AM      Component Value Range   Sodium 138  135 - 145 mEq/L   Potassium 3.9  3.5 - 5.1 mEq/L   Chloride 106  96 - 112 mEq/L   CO2 25  19 - 32 mEq/L   Glucose, Bld 108 (*) 70 - 99 mg/dL   BUN 15  6 - 23 mg/dL   Creatinine, Ser 1.61  0.50 - 1.35 mg/dL   Calcium 8.2 (*) 8.4 - 10.5 mg/dL   GFR calc non Af Amer 68 (*) >90 mL/min   GFR calc Af Amer 79 (*) >90 mL/min  CBC WITH DIFFERENTIAL     Status: Abnormal   Collection Time   02/20/12  5:12 AM      Component Value Range   WBC 8.3  4.0 - 10.5 K/uL   RBC 3.38 (*) 4.22 - 5.81 MIL/uL   Hemoglobin 10.8 (*) 13.0 - 17.0 g/dL   HCT 09.6 (*) 04.5 - 40.9 %   MCV 94.7  78.0 - 100.0 fL   MCH 32.0  26.0 - 34.0 pg   MCHC 33.8  30.0 - 36.0 g/dL   RDW 81.1  91.4 - 78.2 %   Platelets 81 (*) 150 - 400 K/uL   Neutrophils Relative 79 (*) 43 - 77 %   Neutro Abs 6.6  1.7 - 7.7 K/uL   Lymphocytes Relative 11 (*) 12 - 46 %   Lymphs Abs 1.0  0.7 - 4.0 K/uL   Monocytes Relative 8  3 - 12 %   Monocytes Absolute 0.6  0.1 - 1.0 K/uL   Eosinophils Relative 2  0 - 5 %   Eosinophils Absolute 0.2  0.0 - 0.7 K/uL   Basophils Relative 0  0 - 1 %   Basophils Absolute 0.0  0.0 -  0.1 K/uL     Assessment/Plan: Diagnosis: right frontal opercular hemorrhage, fall 02/18/12 1. Does the need for close, 24 hr/day medical supervision in concert with the patient's rehab needs make it unreasonable for this patient to be served in a less intensive setting? Potentially 2. Co-Morbidities requiring supervision/potential complications: right lower extremity weakness, atrial fibrillation, thrombocytopenia 3. Due to bladder management, bowel management, safety, skin/wound care, disease management, medication administration and patient education, does the patient require 24 hr/day rehab nursing? Potentially 4. Does the patient require coordinated care of a physician, rehab nurse, PT (1-2 hrs/day, 5 days/week), OT (1-2 hrs/day, 5 days/week) and SLP (.5-1 hrs/day, 5 days/week) to address physical and functional deficits in the context of the above medical diagnosis(es)? Potentially Addressing deficits in the following areas: balance, endurance, locomotion, strength, transferring, bowel/bladder control, bathing, dressing, feeding and grooming 5. Can the patient actively participate in an intensive therapy program of at least 3 hrs of therapy per day at least 5 days per week? Potentially 6. The potential for patient to make measurable gains while on inpatient rehab is good 7. Anticipated functional outcomes upon discharge from inpatient rehab are Supmobility with PT, supervision ADLs with OT, higher level cognitive function for safety and home environment with SLP. 8. Estimated rehab length of stay to reach the above functional goals is: 7-10 days 9. Does the patient have adequate social supports to accommodate these discharge functional goals? Potentially 10. Anticipated D/C setting: Home 11. Anticipated post D/C treatments: HH therapy 12. Overall Rehab/Functional Prognosis: good  RECOMMENDATIONS: This patient's condition  is appropriate for continued rehabilitative care in the following  setting: if patient remains at a min assist or heavier level and patient has 24 7 supervision then I would recommend CIR, if not SNF Patient has agreed to participate in recommended program. Potentially Note that insurance prior authorization may be required for reimbursement for recommended care.  Comment:    02/20/2012

## 2012-02-20 NOTE — Progress Notes (Signed)
Physical Therapy Treatment Patient Details Name: [name] MRN: [id number] DOB: 1933-04-27 Today's Date: 02/20/2012 Time: 1223-1259 PT Time Calculation (min): 36 min  PT Assessment / Plan / Recommendation Comments on Treatment Session  Pt with decr cognition this date that was difficult to fully assess--appears decr attention affecting abililty to complete tasks and at times follow commands. ? role pain was playing in this vs motor planning problems. Demonstrated clear decr safety awareness and judgment with attempt to get back to bed on his own.    Follow Up Recommendations  Inpatient Rehab;Supervision/Assistance - 24 hour (depending on caregiver availability)    Barriers to Discharge        Equipment Recommendations  Defer to next venue    Recommendations for Other Services    Frequency Min 4X/week   Plan Discharge plan remains appropriate;Frequency needs to be updated    Precautions / Restrictions Precautions Precautions: Fall;Other (comment) Precaution Comments: No active hip abduction of the RLE Restrictions Weight Bearing Restrictions: No   Pertinent Vitals/Pain Denied pain in Rt hip on multiple questionings; then once seated, acknowledged that it does hurt; repositioned for comfort    Mobility  Bed Mobility Bed Mobility: Supine to Sit;Sitting - Scoot to Edge of Bed Supine to Sit: 3: Mod assist;HOB elevated Sitting - Scoot to [place] of Bed: 5: Supervision Details for Bed Mobility Assistance: vc for technique and sequencing; pt able to initiate some parts of task immediately, and then would get "lost" and need cues to get back on track Transfers Transfers: Sit to Stand;Stand to Sit Sit to Stand: 1: +2 Total assist;From elevated surface;With upper extremity assist;From bed;With armrests;From chair/3-in-1 Sit to Stand: Patient Percentage: 50% Stand to Sit: 1: +2 Total assist;With upper extremity assist;To elevated surface;To bed;To chair/3-in-1 Stand to Sit: Patient  Percentage: 50% Details for Transfer Assistance: Pt initiated transfer with repeated cues for safe hand placement, then as he pushed up would achieve ~1/2 standing and freeze (? pain; ?motor planning, ?attention, ?unsure how to transition his hands from surface to RW) Ambulation/Gait Ambulation/Gait Assistance: 1: +2 Total assist Ambulation/Gait: Patient Percentage: 70% Ambulation Distance (Feet): 3 Feet Assistive device: Rolling walker Ambulation/Gait Assistance Details: Pt required several minutes of pre-gait training to begin using his UEs appropriately on RW to allow him to advance his LLE. Ultimately, pt walked towards chair, pivoted and chair brought up behind him. Gait Pattern: Step-to pattern;Decreased stride length;Right foot flat;Left foot flat;Shuffle    Exercises     PT Diagnosis:    PT Problem List:   PT Treatment Interventions:     PT Goals Acute Rehab PT Goals Pt will go Supine/Side to Sit: with supervision PT Goal: Supine/Side to Sit - Progress: Progressing toward goal Pt will go Sit to Stand: with min assist PT Goal: Sit to Stand - Progress: Progressing toward goal Pt will Transfer Bed to Chair/Chair to Bed: with min assist PT Transfer Goal: Bed to Chair/Chair to Bed - Progress: Progressing toward goal Pt will Ambulate: 16 - 50 feet;with mod assist;with least restrictive assistive device PT Goal: Ambulate - Progress: Progressing toward goal  Visit Information  Last PT Received On: 02/20/12 Assistance Needed: +2 PT/OT Co-Evaluation/Treatment: Yes    Subjective Data  Subjective: "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 due to pain during sit to stand--pt "freezes" in semi-standing position and requires incr cues and assist to complete task and move hands to the next surface)  Following Commands: Follows one step commands inconsistently (? related to decr attention) Safety/Judgement: Decreased awareness of safety precautions;Impulsive;Decreased awareness of need for assistance Safety/Judgement - Other Comments: Pt verbalized understanding of need to call for assist prior to getting up and demonstrated use of call bell, yet after 5 minutes in the chair, nursing found him scooted out to edge of seat with feet on floor (over elevated legrest of recliner) and attempt to stand. When asked how he was going to get back to bed when it took 2 person heavy assist to get OOB, he replied "Oh, I'd have figured a Engineer, site Standing - Balance Support: Bilateral upper extremity supported Static Standing - Level of Assistance: 1: +2 Total assist;Patient percentage (comment) (pt= 50-60%) Static Standing - Comment/# of Minutes: Pt continues with lean to Left on initial standing with seemingly no awareness  End of Session PT - End of Session Equipment Utilized During Treatment: Gait belt Activity Tolerance: Patient tolerated treatment well Patient left: in chair;with call bell/phone within reach Nurse Communication: Mobility status;Need for lift equipment;Other (comment) (recommended [facility])   GP     Nesiah Jump 02/20/2012, 3:38 PM

## 2012-02-21 DIAGNOSIS — S72109A Unspecified trochanteric fracture of unspecified femur, initial encounter for closed fracture: Secondary | ICD-10-CM | POA: Diagnosis present

## 2012-02-21 LAB — CBC WITH DIFFERENTIAL/PLATELET
Eosinophils Absolute: 0.2 10*3/uL (ref 0.0–0.7)
HCT: 30.5 % — ABNORMAL LOW (ref 39.0–52.0)
Hemoglobin: 10.4 g/dL — ABNORMAL LOW (ref 13.0–17.0)
Lymphs Abs: 0.9 10*3/uL (ref 0.7–4.0)
MCH: 32 pg (ref 26.0–34.0)
Monocytes Absolute: 0.7 10*3/uL (ref 0.1–1.0)
Monocytes Relative: 9 % (ref 3–12)
Neutro Abs: 6.1 10*3/uL (ref 1.7–7.7)
Neutrophils Relative %: 77 % (ref 43–77)
RBC: 3.25 MIL/uL — ABNORMAL LOW (ref 4.22–5.81)

## 2012-02-21 LAB — BASIC METABOLIC PANEL
BUN: 16 mg/dL (ref 6–23)
Chloride: 103 mEq/L (ref 96–112)
Glucose, Bld: 98 mg/dL (ref 70–99)
Potassium: 3.7 mEq/L (ref 3.5–5.1)

## 2012-02-21 NOTE — Progress Notes (Signed)
Stroke Team Progress Note  HISTORY [name] is an 78 y.o. male who lives alone and fell at some time between 6pm yesterday and 1 am this am. Patient was found by family members this AM after he called his daughter telling her he had fallen. On arrival to the ED he complained of hip pain only. CT head showed 2.4 x 1.9 cm intraparenchymal hematoma in the subcortical right frontal lobe. Patient is on Xeralto for A-fib at time of arrival. Currently patient has been taken off all antiplatelets and anticoagulants. Oral anticoagulation protocol has been initiated. Neurology asked to consult. Patient was not a TPA candidate secondary to hemorrhage. He was admitted to the neuro ICU for further evaluation and treatment.  SUBJECTIVE No family is at the bedside. He states he is feeling better. Sitting up in chair. Says he is ready for rehab.  OBJECTIVE Most recent Vital Signs: Filed Vitals:   02/20/12 2200 02/21/12 0200 02/21/12 0600 02/21/12 1107  BP: 147/67 141/61 104/68 104/51  Pulse: 68 63 62 57  Temp: 99.3 F (37.4 C) 99.5 F (37.5 C) 98.9 F (37.2 C) 97.9 F (36.6 C)  TempSrc:    Oral  Resp: 18 18 18 17   Height:      Weight:      SpO2: 97% 99% 97% 100%   Intake/Output from previous day: 07/31 0701 - 08/01 0700 In: 360 [P.O.:360] Out: 375 [Urine:375]  IV Fluid Intake:    MEDICATIONS     . amiodarone  200 mg Oral Daily  . atorvastatin  20 mg Oral q1800  . furosemide  10 mg Intravenous Once  . irbesartan  150 mg Oral Daily  . pantoprazole  40 mg Oral Q1200  . sodium chloride  3 mL Intravenous Q12H   PRN:  acetaminophen, acetaminophen, ondansetron (ZOFRAN) IV, ondansetron  Diet:  Cardiac thin liquids Activity:  WBAT DVT Prophylaxis:  SCDs   CLINICALLY SIGNIFICANT STUDIES Basic Metabolic Panel:   Lab 02/21/12 0710 02/20/12 0512  NA 138 138  K 3.7 3.9  CL 103 106  CO2 26 25  GLUCOSE 98 108*  BUN 16 15  CREATININE 1.02 1.03  CALCIUM 8.5 8.2*  MG -- --  PHOS -- --    Liver Function Tests:   Lab 02/19/12 0657  AST 40*  ALT 14  ALKPHOS 60  BILITOT 1.2  PROT 5.9*  ALBUMIN 2.8*   CBC:   Lab 02/21/12 0710 02/20/12 0512  WBC 7.8 8.3  NEUTROABS 6.1 6.6  HGB 10.4* 10.8*  HCT 30.5* 32.0*  MCV 93.8 94.7  PLT 83* 81*   Coagulation:   Lab 02/19/12 0657 02/19/12 0011 02/18/12 1013  LABPROT 22.7* 25.6* 39.4*  INR 1.96* 2.29* 3.98*   Cardiac Enzymes:   Lab 02/19/12 0657 02/18/12 2210 02/18/12 1515  CKTOTAL 1877* 1678* 1697*  CKMB 2.4 3.2 4.6*  CKMBINDEX -- -- --  TROPONINI <0.30 <0.30 <0.30   Urinalysis:   Lab 02/18/12 1509  COLORURINE YELLOW  LABSPEC 1.024  PHURINE 6.0  GLUCOSEU NEGATIVE  HGBUR MODERATE*  BILIRUBINUR NEGATIVE  KETONESUR 15*  PROTEINUR 30*  UROBILINOGEN 1.0  NITRITE NEGATIVE  LEUKOCYTESUR NEGATIVE   Lipid Panel     Component Value Date/Time   CHOL 125 02/19/2012 0657   TRIG 54 02/19/2012 0657   HDL 57 02/19/2012 0657   CHOLHDL 2.2 02/19/2012 0657   VLDL 11 02/19/2012 0657   LDLCALC 57 02/19/2012 0657   HgbA1C  Lab Results  Component Value Date  HGBA1C 5.5 02/18/2012   Dg Lumbar Spine Complete 02/18/2012  No fracture or dislocation is seen.  Moderate to severe multilevel degenerative changes.    Dg Hip Complete Right  Minimally displaced fracture involving the right greater trochanter.  No definite intertrochanteric extension.     Ct Hip Right Wo Contrast 02/18/2012  Essentially nondisplaced fracture in the lateral aspect of the right greater trochanter.    CT of the brain   02/18/2012 2.6 x 2.4 cm intraparenchymal hematoma in the subcortical right frontal lobe, slightly increased in size as above.  Mild surrounding vasogenic edema is similar. 02/18/2012 2.4 x 1.9 cm intraparenchymal hematoma in the subcortical right frontal lobe.  Mild surrounding vasogenic edema.  Very mild soft tissue swelling overlying the right frontal bone. No underlying calvarial fracture  MRI of the brain  02/19/2012  1.  Stable  appearance of a 2.6 cm right frontal opercular hemorrhage and surrounding edema. 2.  Focus of remote blood products in the right cerebellum. 3.  No other acute hemorrhage. 4.  Age advanced atrophy and moderate white matter disease.  This is nonspecific, but likely reflects the sequelae of chronic microvascular ischemia.   MRA of the brain  02/19/2012     1.  Mild tortuosity of the distal cervical internal carotid arteries, more prominent on the left.  This can be seen in the setting of hypertension. 2.  No significant proximal stenosis, aneurysm, or branch vessel occlusion. 3.  No significant vascular malformation associated with hemorrhage. 4.  Tapering of distal branch vessels, compatible with mild small vessel disease.   2D Echocardiogram  EF 55-60% with no source of embolus.   Carotid Doppler  No internal carotid artery stenosis bilaterally. Vertebrals with antegrade flow bilaterally.   CXR  02/18/2012  No evidence of acute cardiopulmonary disease.  Original Report Authenticated By: Charline Bills, M.D.   EKG  unchanged from previous tracings, normal sinus rhythm with short PR.   Therapy Recommendations CIR  Physical Exam  Pleasant elderly African Americanmale currently not in distress.Awake alert. Afebrile. Head is nontraumatic. Neck is supple without bruit. Hearing is normal. Cardiac exam no murmur or gallop. Lungs are clear to auscultation. Distal pulses are well felt.   Neurological Exam : Awake  Alert oriented x 3. Normal speech and language.eye movements full without nystagmus.fundi not visualized. Visual fields appear normal. Face symmetric. Tongue midline. Normal strength, tone, reflexes and coordination.minimally diminished fine finger movements on the left. Orbits right over left approximately. Normal sensation. Gait deferred.   ASSESSMENT [name] is a 78 y.o. male with a  subcortical right frontal lobe intraparenchymal hematoma with mild surrounding vasogenic edema.  Hemorrhage led to a fall, causing a right greater trochanter fracture. Hemorrhage occurred while on xarelto for atrial fibrillation in the setting of normal BP.  Reversed with FEIBA protocol. Patient with resultant mild left hemiparesis from the stroke.On xarelto prior to admission. Now on no antiplatelet for secondary stroke prevention given hemorrhage; no longer an anticoagulant candidate secondary to ICH. No role for OR at this time.  -elevated INR 3.98 on admission while on xarelto. Likely unrelated. INR down to 1.96 after reversal.  -right trochanter fx. Ortho recommends no surgical intervention. Can be wt bearing but should use walker for 4-6 wks.  Hospital day # 3  TREATMENT/PLAN -Patient for transfer to rehab today. -Restart baby aspirin 81 mg in 1 week for secondary stroke prevention. Recommend to check with CT to assure that the ICH is stabilze per  CT. -No further neurologic intervention is recommended at this time.  If further questions arise, please call or page at that time.  Thank you for allowing neurology to participate in the care of this patient. -Patient will need to followup with Dr. Pearlean Brownie in 2 mos.  Guy Franco, [facility],  MBA, MHA [facility] Pager: [phone] 02/21/2012 11:43 AM  Scribe for Dr. Delia Heady, Stroke Center Medical Director. He has personally reviewed chart, pertinent data, examined the patient and developed the plan of care. Pager:  [phone]

## 2012-02-21 NOTE — Progress Notes (Signed)
Physical Therapy Treatment Patient Details Name: UCHENNA RAPPAPORT MRN: [id number] DOB: 1933/02/05 Today's Date: 02/21/2012 Time: 1006-1030 PT Time Calculation (min): 24 min  PT Assessment / Plan / Recommendation Comments on Treatment Session  Patient progressing well with ambulation this session and very motivated to progress. Following command and cues well today.     Follow Up Recommendations  Inpatient Rehab;Supervision/Assistance - 24 hour    Barriers to Discharge        Equipment Recommendations  Defer to next venue    Recommendations for Other Services    Frequency Min 4X/week   Plan Discharge plan remains appropriate;Frequency remains appropriate    Precautions / Restrictions Precautions Precautions: Fall Precaution Comments: No active hip abd Restrictions RLE Weight Bearing: Weight bearing as tolerated   Pertinent Vitals/Pain     Mobility  Bed Mobility Supine to Sit: 3: Mod assist;HOB elevated Sitting - Scoot to Edge of Bed: 5: Supervision Details for Bed Mobility Assistance: A for LEs and to assist and to elevate shoulders off of bed Transfers Sit to Stand: 1: +2 Total assist;From bed;From chair/3-in-1;With upper extremity assist;With armrests Sit to Stand: Patient Percentage: 80% Stand to Sit: To chair/3-in-1;With upper extremity assist;With armrests;1: +2 Total assist Stand to Sit: Patient Percentage: 80% Details for Transfer Assistance: A to initiate stand. Patient still "freezes" midway but able to stand fully with increased time. A for hand placement on RW. Cue for technique and to extend knees Ambulation/Gait Ambulation/Gait Assistance: 4: Min assist Ambulation Distance (Feet): 60 Feet Assistive device: Rolling walker Ambulation/Gait Assistance Details: A for LE position andRW management. Cues for gait sequence and technique.  Gait Pattern: Decreased stride length;Step-through pattern    Exercises General Exercises - Lower Extremity Ankle Circles/Pumps:  AROM;Both;10 reps Quad Sets: AROM;Both;10 reps Heel Slides: AAROM;Right;10 reps   PT Diagnosis:    PT Problem List:   PT Treatment Interventions:     PT Goals Acute Rehab PT Goals PT Goal: Supine/Side to Sit - Progress: Progressing toward goal PT Goal: Sit to Stand - Progress: Progressing toward goal PT Transfer Goal: Bed to Chair/Chair to Bed - Progress: Progressing toward goal PT Goal: Ambulate - Progress: Progressing toward goal  Visit Information  Last PT Received On: 02/21/12 Assistance Needed: +2    Subjective Data      Cognition  Overall Cognitive Status: Appears within functional limits for tasks assessed/performed Arousal/Alertness: Awake/alert Orientation Level: Appears intact for tasks assessed Behavior During Session: [facility] for tasks performed    Balance     End of Session PT - End of Session Equipment Utilized During Treatment: Gait belt Activity Tolerance: Patient tolerated treatment well Patient left: in chair;with call bell/phone within reach Nurse Communication: Mobility status   GP     Fredrich Birks 02/21/2012, 12:11 PM 02/21/2012 Fredrich Birks PTA [phone] pager [phone] office

## 2012-02-21 NOTE — Progress Notes (Signed)
PCP: Georgianne Fick, MD  Brief HPI:  [name] is an 78 y.o. male who lives alone and fell at home. Patient was found by family members on the morning of admission. On arrival to the ED he complained of hip pain only. CT head showed 2.4 x 1.9 cm intraparenchymal hematoma in the subcortical right frontal lobe. Patient was on Xeralto for A-fib at time of arrival. Currently patient has been taken off all antiplatelets and anticoagulants. Neurosurgery and Neurology asked to consult. Patient was not a TPA candidate secondary to hemorrhage. He was admitted to the neuro ICU for further evaluation and treatment.  Past medical history:  Past Medical History  Diagnosis Date  . Hypertension   . Anemia   . Thrombocytopenia   . A-fib   . Peripheral vascular disease   . Fatigue   . Osteoarthritis   . Hyperlipidemia     Consultants: Neurology, Neurosurgery  Procedures: None  Subjective: Patient feels well. Feels he is a little weak and may benefit from rehab. Denies any pain currently.  Objective: Vital signs in last 24 hours: Temp:  [98.9 F (37.2 C)-99.5 F (37.5 C)] 98.9 F (37.2 C) (08/01 0600) Pulse Rate:  [60-68] 62  (08/01 0600) Resp:  [18] 18  (08/01 0600) BP: (104-147)/(56-68) 104/68 mmHg (08/01 0600) SpO2:  [97 %-99 %] 97 % (08/01 0600) Weight:  [84.6 kg (186 lb 8.2 oz)] 84.6 kg (186 lb 8.2 oz) (07/31 1200) Weight change: 0 kg (0 lb) Last BM Date: 02/19/12  Intake/Output from previous day: 07/31 0701 - 08/01 0700 In: 360 [P.O.:360] Out: 375 [Urine:375] Intake/Output this shift:    General appearance: alert, cooperative, appears stated age and no distress Resp: clear to auscultation bilaterally Cardio: regular rate and rhythm, S1, S2 normal, no murmur, click, rub or gallop GI: soft, non-tender; bowel sounds normal; no masses,  no organomegaly Extremities: extremities normal, atraumatic, no cyanosis or edema Skin: Skin color, texture, turgor normal. No rashes  or lesions Neurologic: Alert and oriented x 3. No focal deficits.  Lab Results:  Basename 02/21/12 0710 02/20/12 0512  WBC 7.8 8.3  HGB 10.4* 10.8*  HCT 30.5* 32.0*  PLT 83* 81*   BMET  Basename 02/21/12 0710 02/20/12 0512 02/19/12 0657  NA 138 138 --  K 3.7 3.9 --  CL 103 106 --  CO2 26 25 --  GLUCOSE 98 108* --  BUN 16 15 --  CREATININE 1.02 1.03 --  CALCIUM 8.5 8.2* --  ALT -- -- 14    Studies/Results: No results found.  Medications:  Scheduled:   . amiodarone  200 mg Oral Daily  . atorvastatin  20 mg Oral q1800  . furosemide  10 mg Intravenous Once  . irbesartan  150 mg Oral Daily  . pantoprazole  40 mg Oral Q1200  . sodium chloride  3 mL Intravenous Q12H   Continuous:  WUJ:[id number], acetaminophen, ondansetron (ZOFRAN) IV, ondansetron  Assessment/Plan:  Principal Problem:  *Intracerebral bleed Active Problems:  Hypertension  Thrombocytopenia  A-fib  Osteoarthritis  Fall from bed  Hip pain, right  Hip fracture, right   Mechanical fall with an intracerebral bleed Patient remains stable. Neurosurgery and neurology has been following. Patient was taken off of anti-coagulation and reversal protocol was initiated. Will need Aspirin in the future.  Right trochantric fracture Stable. Ok for weight bearing per Ortho. PT/OT  Atrial fibrillation Stable rate. On Amiodarone. No anticoagulation for now.   Thrombocytopenia Status post platelet transfusion on admission. Counts are  stable. Etiology remains unclear.  DVT prophylaxis SCD's  H/O Hypertension and Hyperlipidemia Stable. Continue Irbesartan and Statin    Code Status: full code   Disposition Plan: Await CIR decision. Patient is medically stable for discharge.    LOS: 3 days   [facility]  [facility] Pager [phone] 02/21/2012, 10:49 AM

## 2012-02-21 NOTE — Progress Notes (Signed)
I met with patient at bedside and contacted his daughter, Maxine Glenn, by phone. He has 24/7 assist at home when he d/c home. I will begin pre certification with [facility] medicare for approval for admit. Daughter is aware rehab venue of CIR vs SNF depending on insurance decision. Please call [phone] with questions.

## 2012-02-22 NOTE — Progress Notes (Signed)
Insurance will not approve inpt rehab at this time. I contacted Dr. Rito Ehrlich and he is aware. Will pursue SNF. I have contacted his daughter, Maxine Glenn and she is aware. She wishes to pursue SNF. I will contact RN CM and SW. Please call for any questions. [phone].

## 2012-02-22 NOTE — Discharge Summary (Signed)
Physician Discharge Summary  Patient ID: [name] MRN: [id number] DOB/AGE: [id number] 76 y.o.  Admit date: 02/18/2012 Discharge date: 02/22/2012  PCP: Georgianne Fick, MD  DISCHARGE DIAGNOSES:  Principal Problem:  *Intracerebral bleed Active Problems:  Hypertension  Thrombocytopenia  A-fib  Osteoarthritis  Fall from bed  Hip pain, right  Trochanteric fracture   RECOMMENDATIONS TO PCP: 1. To decide re-initiation of Aspirin at follow up with Dr. Pearlean Brownie. Will need Ct head prior to that time.  DISCHARGE CONDITION: fair  INITIAL HISTORY: [name] is an 78 y.o. male who lives alone and fell at home. Patient was found by family members on the morning of admission. On arrival to the ED he complained of hip pain only. CT head showed 2.4 x 1.9 cm intraparenchymal hematoma in the subcortical right frontal lobe. Patient was on Xeralto for A-fib at time of arrival. Currently patient has been taken off all antiplatelets and anticoagulants. Neurosurgery and Neurology asked to consult. Patient was not a TPA candidate secondary to hemorrhage. He was admitted to the neuro ICU for further evaluation and treatment.    HOSPITAL COURSE:   Mechanical fall with an intracerebral bleed  The patient was initially admitted to the intensive care unit. Anticoagulation reversal protocol was initiated. Patient remained stable. Subsequently, was transferred to the floor. Neurology, and neurosurgery were following the patient. No intervention was done. He has been seen by physical and occupational therapy and they feel that inpatient rehabilitation. May be desirable. He has been accepted, however, we're awaiting insurance approval. If he is approved to go to inpatient, rehabilitation. He'll be sent today. If not we may have to pursue skilled nursing facility. Aspirin will have to be resumed at some point. However, this will be deferred to the neurologist at followup. They will likely need to obtain a CT  head. Prior to initiation of aspirin. He will no longer be on anticoagulation.   Right trochantric fracture  Patient was seen the orthopedic service. He was cleared for weightbearing. They would like to see him in followup.   Atrial fibrillation  Stable rate. On Amiodarone. No anticoagulation for now due to intracranial bleeding.   Thrombocytopenia  Status post platelet transfusion on admission. Counts are stable. Etiology remains unclear.   H/O Hypertension and Hyperlipidemia  These issues have been stable. Continue home medications.  Hopefully patient will go to inpatient rehabilitation today. He is completely stable at this time.   PERTINENT LABS: His hemoglobin was 10.4. Platelet count is 83. Total CK was 1678. His INR was 3.98 at admission.   IMAGING STUDIES Dg Chest 2 View  02/18/2012  *RADIOLOGY REPORT*  Clinical Data: Fall  CHEST - 2 VIEW  Comparison: 01/06/2012  Findings: Lungs are essentially clear.  No pleural effusion or pneumothorax.  Mild right hilar prominence, likely vascular.  The heart is normal in size.  Mild degenerative changes of the visualized thoracolumbar spine.  IMPRESSION: No evidence of acute cardiopulmonary disease.  Original Report Authenticated By: Charline Bills, M.D.   Dg Lumbar Spine Complete  02/18/2012  *RADIOLOGY REPORT*  Clinical Data: Fall, low back pain  LUMBAR SPINE - COMPLETE 4+ VIEW  Comparison: CT abdomen pelvis dated 01/08/2007.  Findings: Five lumbar-type vertebral bodies.  Normal lumbar lordosis.  No evidence of fracture or dislocation.  Vertebral body heights are essentially maintained, noting mild stable changes involving L2.  Moderate to severe multilevel degenerative changes.  IMPRESSION: No fracture or dislocation is seen.  Moderate to severe multilevel degenerative changes.  Original  Report Authenticated By: Charline Bills, M.D.   Dg Hip Complete Right  02/18/2012  *RADIOLOGY REPORT*  Clinical Data: Fall, right hip pain  RIGHT HIP  - COMPLETE 2+ VIEW  Comparison: None.  Findings: Minimally displaced fracture involving the greater trochanter.  No definite intertrochanteric extension.  Degenerative changes of the lower lumbar spine.  Surgical clips in the pelvis.  IMPRESSION: Minimally displaced fracture involving the right greater trochanter.  No definite intertrochanteric extension.  If the patient is nonweightbearing, consider MRI or CT for further evaluation of the right hip.  Original Report Authenticated By: Charline Bills, M.D.   Ct Head Wo Contrast  02/18/2012  *RADIOLOGY REPORT*  Clinical Data: Intraparenchymal hematoma follow-up.  CT HEAD WITHOUT CONTRAST  Technique:  Contiguous axial images were obtained from the base of the skull through the vertex without contrast.  Comparison: 02/18/2012  Findings: The subcortical right frontal lobe hematoma is slightly larger at 2.6 x 2.4 cm (2.4 x 1.9 cm previously).  There is surrounding edema which results in local sulcal effacement. However, no global mass effect/midline shift.  No new areas of hemorrhage or definitive CT evidence of additional acute infarction. Periventricular and subcortical white matter hypodensities are most in keeping with chronic microangiopathic change.  No overt hydrocephalus.  Partially empty sella.  No abnormal extra-axial fluid collections.  Partially opacified right maxillary sinus is nonspecific. Otherwise, the visualized paranasal sinuses and mastoid air cells are predominately clear. Mild right frontal scalp swelling is similar to prior.  No underlying calvarial fracture.  IMPRESSION: 2.6 x 2.4 cm intraparenchymal hematoma in the subcortical right frontal lobe, slightly increased in size as above.  Mild surrounding vasogenic edema is similar.  Original Report Authenticated By: Waneta Martins, M.D.   Ct Head Wo Contrast  02/18/2012  *RADIOLOGY REPORT*  Clinical Data: Fall, on blood thinners  CT HEAD WITHOUT CONTRAST  Technique:  Contiguous axial  images were obtained from the base of the skull through the vertex without contrast.  Comparison: None.  Findings: 2.4 x 1.9 cm focus of hemorrhage in the subcortical right frontal lobe (series 2/image 17).  Very mild surrounding vasogenic edema.  No mass lesion, mass effect, or midline shift.  No CT evidence of acute infarction.  Subcortical white matter and periventricular small vessel ischemic changes.  Global cortical atrophy with secondary ventricular prominence.  The visualized paranasal sinuses are essentially clear. The mastoid air cells are unopacified.  Very mild soft tissue swelling overlying the right frontal bone. No evidence of calvarial fracture.  IMPRESSION: 2.4 x 1.9 cm intraparenchymal hematoma in the subcortical right frontal lobe.  Mild surrounding vasogenic edema.  Very mild soft tissue swelling overlying the right frontal bone. No underlying calvarial fracture.  Critical Value/emergent results were called by telephone at the time of interpretation on 02/18/2012 at 1145 hours to Dr. Lorenso Courier, who verbally acknowledged these results.  Original Report Authenticated By: Charline Bills, M.D.   [facility] Wo Contrast  02/19/2012  *RADIOLOGY REPORT*  Clinical Data:  78 year old male who was found on the floor this morning.  Question stroke.  The patient states he slipped and fell. He is unable to get up due to right-sided pain.  Right frontal lobe hemorrhage.  MRI HEAD WITHOUT CONTRAST MRA HEAD WITHOUT CONTRAST  Technique:  Multiplanar, multiecho pulse sequences of the brain and surrounding structures were obtained without intravenous contrast. Angiographic images of the head were obtained using MRA technique without contrast.  Comparison:  CT head without contrast 02/18/2012.  MRI  HEAD  Findings:  A right frontal parenchymal hemorrhage is stable in size.  There is some surrounding edema. A focal area of susceptibility within the right cerebellum is compatible with a remote hemorrhage.   Calcifications are present over the vertex.  No other focal hemorrhages present.  Mild generalized atrophy is present.  Confluent periventricular scattered subcortical T2 and FLAIR hyperintensities are present bilaterally.  The ventricles are proportionate to the degree of atrophy.  No significant extra-axial fluid collection is present.  Flow is present in the major intracranial arteries.  The globes and orbits are intact.  Mild mucosal thickening is present in the anterior ethmoid air cells bilaterally.  Circumferential mucosal thickening is more prominent in the right than left maxillary sinuses.  A small amount of fluid is present in the inferior mastoid air cells bilaterally.  No obstructing nasopharyngeal lesion is present.  Basal ganglia calcifications are noted.  IMPRESSION:  1.  Stable appearance of a 2.6 cm right frontal opercular hemorrhage and surrounding edema. 2.  Focus of remote blood products in the right cerebellum. 3.  No other acute hemorrhage. 4.  Age advanced atrophy and moderate white matter disease.  This is nonspecific, but likely reflects the sequelae of chronic microvascular ischemia.  MRA HEAD  Findings: Mild tortuosity is evident within the cervical left internal carotid artery.  The A1 and M1 segments are normal.  The MCA bifurcations are within normal limits bilaterally.  There is some attenuation of distal MCA branch vessels bilaterally.  No significant proximal stenosis or occlusion is present.  There is no evidence for a significant vascular malformation associated with hemorrhage.  The right vertebral artery is the dominant vessel.  The left vertebral artery essentially terminates at the PICA.  The basilar artery is within normal limits.  Both posterior cerebral arteries originate from basilar tip.  There is significant contribution from posterior communicating arteries bilaterally, more prominently on the left.  There is tapering of distal PCA branch vessels bilaterally.   IMPRESSION:  1.  Mild tortuosity of the distal cervical internal carotid arteries, more prominent on the left.  This can be seen in the setting of hypertension. 2.  No significant proximal stenosis, aneurysm, or branch vessel occlusion. 3.  No significant vascular malformation associated with hemorrhage. 4.  Tapering of distal branch vessels, compatible with mild small vessel disease.  Original Report Authenticated By: Jamesetta Orleans. MATTERN, M.D.   Mr Brain Wo Contrast  02/19/2012  *RADIOLOGY REPORT*  Clinical Data:  78 year old male who was found on the floor this morning.  Question stroke.  The patient states he slipped and fell. He is unable to get up due to right-sided pain.  Right frontal lobe hemorrhage.  MRI HEAD WITHOUT CONTRAST MRA HEAD WITHOUT CONTRAST  Technique:  Multiplanar, multiecho pulse sequences of the brain and surrounding structures were obtained without intravenous contrast. Angiographic images of the head were obtained using MRA technique without contrast.  Comparison:  CT head without contrast 02/18/2012.  MRI HEAD  Findings:  A right frontal parenchymal hemorrhage is stable in size.  There is some surrounding edema. A focal area of susceptibility within the right cerebellum is compatible with a remote hemorrhage.  Calcifications are present over the vertex.  No other focal hemorrhages present.  Mild generalized atrophy is present.  Confluent periventricular scattered subcortical T2 and FLAIR hyperintensities are present bilaterally.  The ventricles are proportionate to the degree of atrophy.  No significant extra-axial fluid collection is present.  Flow is present in  the major intracranial arteries.  The globes and orbits are intact.  Mild mucosal thickening is present in the anterior ethmoid air cells bilaterally.  Circumferential mucosal thickening is more prominent in the right than left maxillary sinuses.  A small amount of fluid is present in the inferior mastoid air cells  bilaterally.  No obstructing nasopharyngeal lesion is present.  Basal ganglia calcifications are noted.  IMPRESSION:  1.  Stable appearance of a 2.6 cm right frontal opercular hemorrhage and surrounding edema. 2.  Focus of remote blood products in the right cerebellum. 3.  No other acute hemorrhage. 4.  Age advanced atrophy and moderate white matter disease.  This is nonspecific, but likely reflects the sequelae of chronic microvascular ischemia.  MRA HEAD  Findings: Mild tortuosity is evident within the cervical left internal carotid artery.  The A1 and M1 segments are normal.  The MCA bifurcations are within normal limits bilaterally.  There is some attenuation of distal MCA branch vessels bilaterally.  No significant proximal stenosis or occlusion is present.  There is no evidence for a significant vascular malformation associated with hemorrhage.  The right vertebral artery is the dominant vessel.  The left vertebral artery essentially terminates at the PICA.  The basilar artery is within normal limits.  Both posterior cerebral arteries originate from basilar tip.  There is significant contribution from posterior communicating arteries bilaterally, more prominently on the left.  There is tapering of distal PCA branch vessels bilaterally.  IMPRESSION:  1.  Mild tortuosity of the distal cervical internal carotid arteries, more prominent on the left.  This can be seen in the setting of hypertension. 2.  No significant proximal stenosis, aneurysm, or branch vessel occlusion. 3.  No significant vascular malformation associated with hemorrhage. 4.  Tapering of distal branch vessels, compatible with mild small vessel disease.  Original Report Authenticated By: Jamesetta Orleans. MATTERN, M.D.   Ct Hip Right Wo Contrast  02/18/2012  *RADIOLOGY REPORT*  Clinical Data: Right hip and leg pain following a fall last night. Minimally displaced right greater trochanter fracture on radiographs earlier today.  CT OF THE RIGHT HIP  WITHOUT CONTRAST  Technique:  Multidetector CT imaging was performed according to the standard protocol. Multiplanar CT image reconstructions were also generated.  Comparison: Right hip radiographs obtained earlier today.  Findings: Essentially nondisplaced fracture in the lateral aspect of the right greater trochanter.  No intertrochanteric extension or angulation.  No additional fractures are seen.  IMPRESSION: Essentially nondisplaced fracture in the lateral aspect of the right greater trochanter.  Original Report Authenticated By: Darrol Angel, M.D.    DISCHARGE EXAMINATION: Blood pressure 132/63, pulse 52, temperature 97.1 F (36.2 C), temperature source Oral, resp. rate 16, height 6\' 2"  (1.88 m), weight 84.6 kg (186 lb 8.2 oz), SpO2 97.00%. General appearance: alert, cooperative, appears stated age and no distress Resp: clear to auscultation bilaterally Cardio: regular rate and rhythm, S1, S2 normal, no murmur, click, rub or gallop GI: soft, non-tender; bowel sounds normal; no masses,  no organomegaly Extremities: extremities normal, atraumatic, no cyanosis or edema Neurologic: Grossly normal  DISPOSITION: Inpatient rehab or SNF  Discharge Orders    Future Appointments: Provider: Department: Dept Phone: Center:   06/02/2012 10:00 AM Radene Gunning [facility] [phone] None   06/02/2012 10:30 AM Exie Parody, MD [facility] [phone] None     Current Discharge Medication List    CONTINUE these medications which have NOT CHANGED   Details  amiodarone (PACERONE) 200 MG  tablet Take 200 mg by mouth daily.      atorvastatin (LIPITOR) 20 MG tablet Take 20 mg by mouth daily.      furosemide (LASIX) 20 MG tablet Take 20 mg by mouth daily.     irbesartan (AVAPRO) 150 MG tablet Take 150 mg by mouth at bedtime.   Associated Diagnoses: Hypertension; Anemia; Thrombocytopenia; A-fib; Peripheral vascular disease; Fatigue; Osteoarthritis    potassium chloride SA (K-DUR,KLOR-CON) 20  MEQ tablet Take 20 mEq by mouth daily.        STOP taking these medications     Rivaroxaban (XARELTO) 20 MG TABS        Follow-up Information    Follow up with GRAVES,JOHN L, MD in 3 weeks. (for r. hip fracture.  If no pain can follow up as needed)    Contact information:   [address] [place] [address] [phone]       Follow up with [facility], MD. Schedule an appointment as soon as possible for a visit in 3 weeks. (post hospitalization follow up)    Contact information:   [address] [place] [address] [phone]       Follow up with Gates Rigg, MD. Schedule an appointment as soon as possible for a visit in 2 months. (stroke follow up)    Contact information:   [address], [address] [facility] [place] [address] [phone]          TOTAL DISCHARGE TIME: 35 mins  [facility]  [facility] Pager [phone]  02/22/2012, 10:41 AM

## 2012-02-22 NOTE — Care Management Note (Signed)
    Page 1 of 1   02/22/2012     5:22:13 PM   CARE MANAGEMENT NOTE 02/22/2012  Patient:  [name], [name]   Account Number:  [id number]  Date Initiated:  02/19/2012  Documentation initiated by:  [facility]  Subjective/Objective Assessment:   admitted with rt hip fracture and intracerebral bleed post fall at home     Action/Plan:   PT eval  OT evalt   Anticipated DC Date:  02/22/2012   Anticipated DC Plan:  SKILLED NURSING FACILITY      DC Planning Services  CM consult      Choice offered to / List presented to:             Status of service:  In process, will continue to follow Medicare Important Message given?   (If response is "NO", the following Medicare IM given date fields will be blank) Date Medicare IM given:   Date Additional Medicare IM given:    Discharge Disposition:    Per UR Regulation:  Reviewed for med. necessity/level of care/duration of stay  If discussed at Long Length of Stay Meetings, dates discussed:    Comments:  02/22/12 Onnie Boer, RN, BSN 1718 PT WAS FOR CIR, INSURANCE DENIED, PT REFUSED SNF, FAMILY NOT ABLE TO PROVIDE 24 HR SUPPORT.  [employer] AUTH APPROVED, AWAITING FAMILY TO CHOOSE BEDS.

## 2012-02-22 NOTE — Progress Notes (Signed)
Physical Therapy Treatment Patient Details Name: [name] MRN: [id number] DOB: 1932/08/04 Today's Date: 02/22/2012 Time: 7829-5621 PT Time Calculation (min): 19 min  PT Assessment / Plan / Recommendation Comments on Treatment Session  Pt unable to ambulate this session, as it was difficult to bear weight through RLE during transfer. Pt with RLE buckling requiring manual assist for correction. Pt required increased cueing for safety throughout.    Follow Up Recommendations  Inpatient Rehab;Supervision/Assistance - 24 hour       Equipment Recommendations  Defer to next venue       Frequency Min 4X/week   Plan Discharge plan remains appropriate;Frequency remains appropriate    Precautions / Restrictions Precautions Precautions: Fall Precaution Comments: No active hip abd Restrictions Weight Bearing Restrictions: No RLE Weight Bearing: Weight bearing as tolerated       Mobility  Bed Mobility Bed Mobility: Supine to Sit;Sitting - Scoot to Edge of Bed Supine to Sit: 3: Mod assist;HOB elevated;With rails Sitting - Scoot to Edge of Bed: 5: Supervision Details for Bed Mobility Assistance: Pt performed supine to sit towards left side of bed to avoid R LE abduction. Transfers Transfers: Sit to Stand;Stand to Sit;Stand Pivot Transfers Sit to Stand: 1: +2 Total assist;From bed;With upper extremity assist Sit to Stand: Patient Percentage: 30% Stand to Sit: 1: +2 Total assist;To chair/3-in-1;With armrests;With upper extremity assist Stand to Sit: Patient Percentage: 30% Stand Pivot Transfers: 1: +2 Total assist Stand Pivot Transfers: Patient Percentage: 40% Details for Transfer Assistance: Assist to initiate stand and to obtain full upright posture.  Pt with great difficulty extending hips and knees to obtain upright posture.  Manual facilitation provided throughout bil posterior hips (with care to R hip) to initate upright trunk positioning. Ambulation/Gait Ambulation/Gait  Assistance: Not tested (comment) (difficulty with stepping during transfer)     PT Goals Acute Rehab PT Goals PT Goal: Supine/Side to Sit - Progress: Progressing toward goal PT Goal: Sit to Stand - Progress: Progressing toward goal PT Transfer Goal: Bed to Chair/Chair to Bed - Progress: Progressing toward goal PT Goal: Ambulate - Progress: Not progressing  Visit Information  Last PT Received On: 02/22/12 Assistance Needed: +2 PT/OT Co-Evaluation/Treatment: Yes    Subjective Data      Cognition  Overall Cognitive Status: Impaired Area of Impairment: Attention;Following commands;Safety/judgement;Awareness of deficits;Problem solving;Memory Arousal/Alertness: Awake/alert Orientation Level: Disoriented to;Situation Behavior During Session: WFL for tasks performed Current Attention Level: Sustained Memory: Decreased recall of precautions Memory Deficits: Pt unable to recall being informed of his RLE injury although informed multiple times by therapists throughout session. Following Commands: Follows one step commands consistently;Follows one step commands with increased time Safety/Judgement: Decreased awareness of safety precautions;Impulsive;Decreased awareness of need for assistance    Balance     End of Session PT - End of Session Equipment Utilized During Treatment: Gait belt Activity Tolerance: Patient tolerated treatment well Patient left: in chair;with call bell/phone within reach Nurse Communication: Mobility status     Milana Kidney 02/22/2012, 4:36 PM  02/22/2012 Milana Kidney DPT PAGER: [phone] OFFICE: [phone]

## 2012-02-22 NOTE — Progress Notes (Signed)
Occupational Therapy Treatment Patient Details Name: [name] MRN: [id number] DOB: [birth year] Today's Date: 02/22/2012 Time: 5621-3086 OT Time Calculation (min): 22 min  OT Assessment / Plan / Recommendation Comments on Treatment Session Session limited due to arrival of lunch tray. Focus of session on maximizing independence with functional transfers.  Pt with increased difficulty during stand pivot transfer today. Continues to have poor recall of precautions and decreased short term memory.      Follow Up Recommendations  Inpatient Rehab    Barriers to Discharge       Equipment Recommendations  Defer to next venue    Recommendations for Other Services    Frequency Min 3X/week   Plan Discharge plan remains appropriate    Precautions / Restrictions Precautions Precautions: Fall Precaution Comments: No active hip abd Restrictions Weight Bearing Restrictions: No RLE Weight Bearing: Weight bearing as tolerated   Pertinent Vitals/Pain See vitals    ADL  Toilet Transfer: Simulated;+2 Total assistance Toilet Transfer: Patient Percentage: 30% Toilet Transfer Method: Stand pivot Toilet Transfer Equipment: Other (comment) (EOB to chair) Equipment Used: Gait belt;Rolling walker Transfers/Ambulation Related to ADLs: +2 assist pt 40% for stand pivot. Pt with difficulty putting weight through R LE.    OT Diagnosis:    OT Problem List:   OT Treatment Interventions:     OT Goals ADL Goals Pt Will Transfer to Toilet: with min assist;Comfort height toilet;3-in-1;Ambulation ADL Goal: Toilet Transfer - Progress: Progressing toward goals Additional ADL Goal #1: Pt will maintain sustained attention for  5 mins. with min verbal cues ADL Goal: Additional Goal #1 - Progress: Progressing toward goals  Visit Information  Last OT Received On: 02/22/12 Assistance Needed: +2    Subjective Data      Prior Functioning       Cognition  Overall Cognitive Status: Impaired Area of  Impairment: Attention;Following commands;Safety/judgement;Awareness of deficits;Problem solving;Memory Arousal/Alertness: Awake/alert Orientation Level: Disoriented to;Situation Behavior During Session: WFL for tasks performed Current Attention Level: Sustained Memory: Decreased recall of precautions Memory Deficits: Pt unable to recall being informed of his RLE injury although informed multiple times by therapists throughout session. Following Commands: Follows one step commands consistently;Follows one step commands with increased time Safety/Judgement: Decreased awareness of safety precautions;Impulsive;Decreased awareness of need for assistance    Mobility Bed Mobility Bed Mobility: Supine to Sit;Sitting - Scoot to Edge of Bed Supine to Sit: 3: Mod assist;HOB elevated;With rails Sitting - Scoot to Edge of Bed: 5: Supervision Details for Bed Mobility Assistance: Pt performed supine to sit towards left side of bed to avoid R LE abduction. Transfers Transfers: Sit to Stand;Stand to Sit Sit to Stand: 1: +2 Total assist;From bed;With upper extremity assist Sit to Stand: Patient Percentage: 30% Stand to Sit: 1: +2 Total assist;To chair/3-in-1;With armrests;With upper extremity assist Stand to Sit: Patient Percentage: 30% Details for Transfer Assistance: Assist to initiate stand and to obtain full upright posture.  Pt with great difficulty extending hips and knees to obtain upright posture.  Manual facilitation provided throughout bil posterior hips (with care to R hip) to initate upright trunk positioning.   Exercises    Balance    End of Session OT - End of Session Equipment Utilized During Treatment: Gait belt Activity Tolerance: Patient tolerated treatment well Patient left: in chair;with call bell/phone within reach Nurse Communication: Mobility status;Need for lift equipment  GO    02/22/2012 Cipriano Mile OTR/L Pager [phone] Office [phone]  Cipriano Mile 02/22/2012, 4:16 PM

## 2012-02-22 NOTE — Clinical Social Work Psychosocial (Signed)
     Clinical Social Work Department BRIEF PSYCHOSOCIAL ASSESSMENT 02/22/2012  Patient:  [name], [name]     Account Number:  [id number]     Admit date:  02/18/2012  Clinical Social Worker:  Peggyann Shoals  Date/Time:  02/22/2012 06:25 PM  Referred by:  Physician  Date Referred:  02/22/2012 Referred for  SNF Placement   Other Referral:   Interview type:  Family Other interview type:    PSYCHOSOCIAL DATA Living Status:  ALONE Admitted from facility:   Level of care:   Primary support name:  French Ana Primary support relationship to patient:  CHILD, ADULT Degree of support available:   supportive    CURRENT CONCERNS Current Concerns  Post-Acute Placement   Other Concerns:    SOCIAL WORK ASSESSMENT / PLAN CSW met with pt address consult. CSW introduced herself and explained role of social work and the process of SNF. Pt shared that he lives alone and his daughter is able to stay with him when he discharges. Pt shared that he wanted to return home over goingt to SNF. Pt gave permission to speak with daughter to address discharge plan.    CSW spoke with pt's daughter, who would like to honor her father's wishes and have him return home. However, she would like to ensure a safe discharge plan as she is not able to provide the 24 hour supervision needed. Pt's daughters will speak with him to talk about discharge plan and follow up with CSW.    CSW will initate SNF search in [facility] and follow up with bed offers. CSW will continue to follow.   Assessment/plan status:  Psychosocial Support/Ongoing Assessment of Needs Other assessment/ plan:   Information/referral to community resources:   SNF List    PATIENTS/FAMILYS RESPONSE TO PLAN OF CARE: Pt was pleasant. Pt's daughter will follow up with CSW regarding discharge plan.

## 2012-02-23 NOTE — Progress Notes (Addendum)
Clinical Social Work Department CLINICAL SOCIAL WORK PLACEMENT NOTE 02/23/2012  Patient:  [name], [name]  Account Number:  [id number] Admit date:  02/18/2012  Clinical Social Worker:  Skip Mayer  Date/time:  02/23/2012 10:40 AM  Clinical Social Work is seeking post-discharge placement for this patient at the following level of care:   SKILLED NURSING   (*CSW will update this form in Epic as items are completed)     Patient/family provided with [facility] Department of Clinical Social Work's list of facilities offering this level of care within the geographic area requested by the patient (or if unable, by the patient's family).    Patient/family informed of their freedom to choose among providers that offer the needed level of care, that participate in Medicare, Medicaid or managed care program needed by the patient, have an available bed and are willing to accept the patient.    Patient/family informed of [facility]' ownership interest in [facility], as well as of the fact that they are under no obligation to receive care at this facility.  PASARR submitted to [employer] on 02/23/2012 (JB) PASARR number received from [employer] on 02/23/2012 (JB)  FL2 transmitted to all facilities in geographic area requested by pt/family on  02/23/2012 (JB) FL2 transmitted to all facilities within larger geographic area on   Patient informed that his/her managed care company has contracts with or will negotiate with  certain facilities, including the following:     Patient/family informed of bed offers received:  02/25/2012 Patient chooses bed at [facility] Physician recommends and patient chooses bed at  SNF  Patient to be transferred to [facility] on 02/25/2012 Patient to be transferred to facility by [facility]  The following physician request were entered in Epic:   Additional Comments: Dellie Burns, MSW, LCSWA [phone] (weekend)  Dede Query, MSW, LCSWA [phone] (weekday)

## 2012-02-23 NOTE — Progress Notes (Signed)
PCP: Georgianne Fick, MD  Brief HPI:  [name] is an 78 y.o. male who lives alone and fell at home. Patient was found by family members on the morning of admission. On arrival to the ED he complained of hip pain only. CT head showed 2.4 x 1.9 cm intraparenchymal hematoma in the subcortical right frontal lobe. Patient was on Xeralto for A-fib at time of arrival. Currently patient has been taken off all antiplatelets and anticoagulants. Neurosurgery and Neurology asked to consult. Patient was not a TPA candidate secondary to hemorrhage. He was admitted to the neuro ICU for further evaluation and treatment.  Past medical history:  Past Medical History  Diagnosis Date  . Hypertension   . Anemia   . Thrombocytopenia   . A-fib   . Peripheral vascular disease   . Fatigue   . Osteoarthritis   . Hyperlipidemia     Consultants: Neurology, Neurosurgery  Procedures: None  Subjective: Patient feels well. No complaints offered. Understands that he will need short term rehab before he can go home.  Objective: Vital signs in last 24 hours: Temp:  [97.8 F (36.6 C)-98.8 F (37.1 C)] 97.9 F (36.6 C) (08/03 0556) Pulse Rate:  [52-57] 53  (08/03 0556) Resp:  [16] 16  (08/03 0556) BP: (113-124)/(54-62) 124/62 mmHg (08/03 0556) SpO2:  [100 %] 100 % (08/03 0556) Weight change:  Last BM Date: 02/19/12  Intake/Output from previous day: 08/02 0701 - 08/03 0700 In: 480 [P.O.:480] Out: 600 [Urine:600] Intake/Output this shift:    General appearance: alert, cooperative, appears stated age and no distress Resp: clear to auscultation bilaterally Cardio: regular rate and rhythm, S1, S2 normal, no murmur, click, rub or gallop GI: soft, non-tender; bowel sounds normal; no masses,  no organomegaly Neurologic: Alert and oriented x 3. No focal deficits.  Lab Results:  [facility] 02/21/12 0710  WBC 7.8  HGB 10.4*  HCT 30.5*  PLT 83*   BMET  Basename 02/21/12 0710  NA 138  K 3.7    CL 103  CO2 26  GLUCOSE 98  BUN 16  CREATININE 1.02  CALCIUM 8.5  ALT --    Studies/Results: No results found.  Medications:  Scheduled:    . amiodarone  200 mg Oral Daily  . atorvastatin  20 mg Oral q1800  . furosemide  10 mg Intravenous Once  . irbesartan  150 mg Oral Daily  . pantoprazole  40 mg Oral Q1200  . sodium chloride  3 mL Intravenous Q12H   Continuous:  WUJ:[id number], acetaminophen, ondansetron (ZOFRAN) IV, ondansetron  Assessment/Plan:  Principal Problem:  *Intracerebral bleed Active Problems:  Hypertension  Thrombocytopenia  A-fib  Osteoarthritis  Fall from bed  Hip pain, right  Trochanteric fracture   Mechanical fall with an intracerebral bleed Patient remains stable. Neurosurgery and neurology has been following. Patient was taken off of anti-coagulation and reversal protocol was initiated. Will need Aspirin in the future which can determined by Neurology at follow up.  Right trochantric fracture Stable. Ok for weight bearing per Ortho. PT/OT  Atrial fibrillation Stable rate. On Amiodarone. No anticoagulation for now.   Thrombocytopenia Status post platelet transfusion on admission. Counts are stable. Etiology remains unclear.  DVT prophylaxis SCD's  H/O Hypertension and Hyperlipidemia Stable. Continue Irbesartan and Statin  Code Status: full code   Disposition Plan: To SNF when bed is available. Possibly on 02/25/12.    LOS: 5 days   [facility]  [facility] Pager [phone] 02/23/2012, 11:03 AM

## 2012-02-24 NOTE — Progress Notes (Signed)
   PCP: Georgianne Fick, MD  Brief HPI:  [name] is an 78 y.o. male who lives alone and fell at home. Patient was found by family members on the morning of admission. On arrival to the ED he complained of hip pain only. CT head showed 2.4 x 1.9 cm intraparenchymal hematoma in the subcortical right frontal lobe. Patient was on Xeralto for A-fib at time of arrival. Currently patient has been taken off all antiplatelets and anticoagulants. Neurosurgery and Neurology asked to consult. Patient was not a TPA candidate secondary to hemorrhage. He was admitted to the neuro ICU for further evaluation and treatment.  Past medical history:  Past Medical History  Diagnosis Date  . Hypertension   . Anemia   . Thrombocytopenia   . A-fib   . Peripheral vascular disease   . Fatigue   . Osteoarthritis   . Hyperlipidemia     Consultants: Neurology, Neurosurgery  Procedures: None  Subjective: Patient continues to feel well. No complaints offered. Understands that he will need short term rehab before he can go home.  Objective: Vital signs in last 24 hours: Temp:  [98.2 F (36.8 C)-98.3 F (36.8 C)] 98.3 F (36.8 C) (08/04 0456) Pulse Rate:  [50-55] 55  (08/04 0456) Resp:  [18-20] 20  (08/04 0456) BP: (101-125)/(52-62) 125/57 mmHg (08/04 0456) SpO2:  [99 %-100 %] 99 % (08/04 0456) Weight change:  Last BM Date: 02/19/12  Intake/Output from previous day: 08/03 0701 - 08/04 0700 In: -  Out: 250 [Urine:250] Intake/Output this shift:    General appearance: alert, cooperative, appears stated age and no distress Resp: clear to auscultation bilaterally Cardio: regular rate and rhythm, S1, S2 normal, no murmur, click, rub or gallop GI: soft, non-tender; bowel sounds normal; no masses,  no organomegaly Neurologic: Alert and oriented x 3. No focal deficits.  Lab Results: No results found for this basename: WBC:2,HGB:2,HCT:2,PLT:2 in the last 72 hours BMET No results found for this  basename: NA:2,K:2,CL:2,CO2:2,GLUCOSE:2,BUN:2,CREATININE:2,CALCIUM:2, AST:2,ALT:2 in the last 72 hours  Studies/Results: No results found.  Medications:  Scheduled:    . amiodarone  200 mg Oral Daily  . atorvastatin  20 mg Oral q1800  . furosemide  10 mg Intravenous Once  . irbesartan  150 mg Oral Daily  . pantoprazole  40 mg Oral Q1200  . sodium chloride  3 mL Intravenous Q12H   Continuous:  ZOX:[id number], acetaminophen, ondansetron (ZOFRAN) IV, ondansetron  Assessment/Plan:  Principal Problem:  *Intracerebral bleed Active Problems:  Hypertension  Thrombocytopenia  A-fib  Osteoarthritis  Fall from bed  Hip pain, right  Trochanteric fracture   Mechanical fall with an intracerebral bleed Patient remains stable. Neurosurgery and neurology has been following. Patient was taken off of anti-coagulation and reversal protocol was initiated. Will need Aspirin in the future which can determined by Neurology at follow up.  Right trochantric fracture Stable. Ok for weight bearing per Ortho. PT/OT  Atrial fibrillation Stable rate. On Amiodarone. No anticoagulation for now.   Thrombocytopenia Status post platelet transfusion on admission. Counts are stable. Etiology remains unclear.  DVT prophylaxis SCD's  H/O Hypertension and Hyperlipidemia Stable. Continue Irbesartan and Statin  Code Status: full code   Disposition Plan: To SNF when bed is available. Possibly on 02/25/12.    LOS: 6 days   [facility]  [facility] Pager [phone] 02/24/2012, 10:53 AM

## 2012-02-25 NOTE — Progress Notes (Signed)
Physical Therapy Treatment Patient Details Name: [name] MRN: [id number] DOB: [birth year] Today's Date: 02/25/2012 Time: 4098-1191 PT Time Calculation (min): 23 min  PT Assessment / Plan / Recommendation Comments on Treatment Session  Patient able to ambulate this session and progressing well with tolerance of moblity. Patient extremely motivated. Rn made aware of ambulation and transfers. Patient to sit up and be OOB as much as tolerated. Patient daughter present and wanting to talk with CSW. CSW made aware and plans on talking to daughter when she returns    Follow Up Recommendations  Skilled nursing facility    Barriers to Discharge        Equipment Recommendations  Defer to next venue    Recommendations for Other Services    Frequency Min 4X/week   Plan Discharge plan remains appropriate;Frequency remains appropriate    Precautions / Restrictions Precautions Precautions: Fall Precaution Comments: No active hip abduction Restrictions RLE Weight Bearing: Weight bearing as tolerated   Pertinent Vitals/Pain Denied pain    Mobility  Bed Mobility Supine to Sit: 4: Min guard;With rails;HOB flat Sitting - Scoot to Edge of Bed: 6: Modified independent (Device/Increase time) Transfers Sit to Stand: 4: Min assist;From bed;With upper extremity assist Stand to Sit: 4: Min assist;With upper extremity assist;With armrests;To chair/3-in-1 Details for Transfer Assistance: A to ensure balance and to help slightly balance stand. Cues for safe technique and proper hand placement. Patient utilizes hand on bed hand on walker technique without difficult. Cues to sit slowly Ambulation/Gait Ambulation/Gait Assistance: 4: Min guard Ambulation Distance (Feet): 80 Feet Assistive device: Rolling walker Ambulation/Gait Assistance Details: Cues to increase step length and for RW management. Patient reverts to shuffle gait without cueing.  Gait Pattern: Step-through pattern;Decreased stride length     Exercises General Exercises - Lower Extremity Long Arc Quad: AAROM;Right;10 reps   PT Diagnosis:    PT Problem List:   PT Treatment Interventions:     PT Goals Acute Rehab PT Goals PT Goal: Supine/Side to Sit - Progress: Progressing toward goal PT Goal: Sit to Stand - Progress: Met PT Transfer Goal: Bed to Chair/Chair to Bed - Progress: Met PT Goal: Ambulate - Progress: Met  Visit Information  Last PT Received On: 02/25/12 Assistance Needed: +1    Subjective Data      Cognition  Overall Cognitive Status: Appears within functional limits for tasks assessed/performed Arousal/Alertness: Awake/alert Orientation Level: Appears intact for tasks assessed Behavior During Session: [facility] for tasks performed Current Attention Level: Selective Memory: Decreased recall of precautions Memory Deficits: Pt unable to recall being informed of his RLE injury although informed multiple times by therapists throughout session.    Balance     End of Session PT - End of Session Equipment Utilized During Treatment: Gait belt Activity Tolerance: Patient tolerated treatment well Patient left: in chair;with call bell/phone within reach Nurse Communication: Mobility status   GP     Fredrich Birks 02/25/2012, 8:59 AM  02/25/2012 Fredrich Birks PTA [phone] pager [phone] office

## 2012-02-25 NOTE — Progress Notes (Signed)
Occupational Therapy Treatment Patient Details Name: [name] MRN: [id number] DOB: [birth year] Today's Date: 02/25/2012 Time: 5784-6962 OT Time Calculation (min): 23 min  OT Assessment / Plan / Recommendation Comments on Treatment Session pt progressing well this session    Follow Up Recommendations  Skilled nursing facility    Equipment Recommendations  Defer to next venue    Plan Discharge plan remains appropriate    Precautions / Restrictions Precautions Precautions: Fall Precaution Comments: No active hip abduction Restrictions RLE Weight Bearing: Weight bearing as tolerated   Pertinent Vitals/Pain Pt denies any pain during session    ADL  Grooming: Performed;Teeth care;Minimal assistance Where Assessed - Grooming: Supported standing Lower Body Dressing: Simulated;Moderate assistance (don socks) Where Assessed - Lower Body Dressing: Unsupported sitting Toilet Transfer: Simulated;Minimal assistance Toilet Transfer Method: Sit to Barista:  (from bed) Toileting - Clothing Manipulation and Hygiene: Simulated;Minimal assistance Where Assessed - Engineer, mining and Hygiene: Standing Equipment Used: Gait belt;Rolling walker Transfers/Ambulation Related to ADLs: Pt Min guard A with RW ambulation >55ft. Pt with short step length; encouraged to increase.  ADL Comments: Pt noted to have questionable right inattention during functional activities - pt able to turn head to read out room numbers during ambulation, but ran into door on right and had difficulty applying tootpaste to toothbrush when held on right. Full ocular range available during testing. Will continue to assess.     OT Diagnosis:    OT Problem List:   OT Treatment Interventions:     OT Goals ADL Goals ADL Goal: Grooming - Progress: Met ADL Goal: Lower Body Dressing - Progress: Progressing toward goals ADL Goal: Toilet Transfer - Progress: Met ADL Goal: Additional Goal  #1 - Progress: Met  Visit Information  Last OT Received On: 02/25/12 Assistance Needed: +1 PT/OT Co-Evaluation/Treatment: Yes    Subjective Data      Prior Functioning       Cognition  Overall Cognitive Status: Appears within functional limits for tasks assessed/performed Arousal/Alertness: Awake/alert Orientation Level: Appears intact for tasks assessed Behavior During Session: [facility] for tasks performed Current Attention Level: Selective Memory: Decreased recall of precautions Memory Deficits: Pt unable to recall being informed of his RLE injury although informed multiple times by therapists throughout session. Safety/Judgement: Decreased awareness of safety precautions;Impulsive;Decreased awareness of need for assistance    Mobility Bed Mobility Supine to Sit: 4: Min guard;With rails;HOB flat Sitting - Scoot to Edge of Bed: 6: Modified independent (Device/Increase time) Transfers Sit to Stand: 4: Min assist;From bed;With upper extremity assist Stand to Sit: 4: Min assist;With upper extremity assist;With armrests;To chair/3-in-1 Details for Transfer Assistance: A to ensure balance and to help slightly balance stand. Cues for safe technique and proper hand placement. Patient utilizes hand on bed hand on walker technique without difficult. Cues to sit slowly   Exercises General Exercises - Lower Extremity Long Arc Quad: AAROM;Right;10 reps  Balance    End of Session OT - End of Session Equipment Utilized During Treatment: Gait belt Activity Tolerance: Patient tolerated treatment well Patient left: in chair;with call bell/phone within reach Nurse Communication: Mobility status  GO     Maraya Gwilliam 02/25/2012, 9:01 AM

## 2012-02-25 NOTE — Clinical Social Work Note (Signed)
Pt is ready for discharge to [facility]. [employer] has been obtained. Pt and family are agreeable to discharge plan. Facility has received discharge summary and is ready to accept pt. [facility] will provide transportation. CSW is signing off as no further needs identified.   Dede Query, MSW, Theresia Majors [phone]

## 2012-02-25 NOTE — Discharge Summary (Signed)
Physician Discharge Summary  Patient ID: [name] MRN: [id number] DOB/AGE: 04/03/1933 78 y.o.  Admit date: 02/18/2012 Discharge date: 02/25/2012  PCP: Georgianne Fick, MD  DISCHARGE DIAGNOSES:  Principal Problem:  *Intracerebral bleed Active Problems:  Hypertension  Thrombocytopenia  A-fib  Osteoarthritis  Fall from bed  Hip pain, right  Trochanteric fracture   RECOMMENDATIONS TO PCP: Will need a repeat Ct head prior to appointment with Neurologist. Will help in determining if he can take aspirin or not.  DISCHARGE CONDITION: fair  INITIAL HISTORY: [name] is an 78 y.o. male who lives alone and fell at home. Patient was found by family members on the morning of admission. On arrival to the ED he complained of hip pain only. CT head showed 2.4 x 1.9 cm intraparenchymal hematoma in the subcortical right frontal lobe. Patient was on Xeralto for A-fib at time of arrival. Currently patient has been taken off all antiplatelets and anticoagulants. Neurosurgery and Neurology asked to consult. Patient was not a TPA candidate secondary to hemorrhage. He was admitted to the neuro ICU for further evaluation and treatment.   HOSPITAL COURSE:   Mechanical fall with an intracerebral bleed  The patient was initially admitted to the intensive care unit. Anticoagulation reversal protocol was initiated. Patient remained stable. Subsequently, was transferred to the floor. Neurology, and neurosurgery were following the patient. No intervention was done. He was seen by physical and occupational therapy and they initially felt that inpatient rehabilitation may be desirable. He was seen by the inpatient rehabilitation staff and was accepted. However, his insurance denied it. They did approve her stay in the skilled nursing facility. Because of this the patient's discharge was delayed. His family including his daughters is attempting to find suitable skilled nursing facility for him today.  Hopefully, he'll be discharged later today. Aspirin will have to be resumed at some point. However, this will be deferred to the neurologist at followup. They will likely need to obtain a CT head prior to initiation of aspirin. He will no longer be on anticoagulation.   Right trochantric fracture  Patient was seen the orthopedic service. He was cleared for weightbearing. They would like to see him in followup.   Atrial fibrillation  Stable rate. On Amiodarone. No anticoagulation for now due to intracranial bleeding.   Thrombocytopenia  Status post platelet transfusion on admission. Counts are stable. Etiology remains unclear.   H/O Hypertension and Hyperlipidemia  These issues have been stable. Continue home medications.   Patient has remained stable over the last 3 days. His pain in the right leg is improving steadily. His activity level has also improved. I do not anticipate his stay in the skilled nursing facility to be longer than a week to 10 days.   PERTINENT LABS:  His hemoglobin was 10.4. Platelet count is 83. Total CK was 1678. His INR was 3.98 at admission.    IMAGING STUDIES Dg Chest 2 View  02/18/2012  *RADIOLOGY REPORT*  Clinical Data: Fall  CHEST - 2 VIEW  Comparison: 01/06/2012  Findings: Lungs are essentially clear.  No pleural effusion or pneumothorax.  Mild right hilar prominence, likely vascular.  The heart is normal in size.  Mild degenerative changes of the visualized thoracolumbar spine.  IMPRESSION: No evidence of acute cardiopulmonary disease.  Original Report Authenticated By: Charline Bills, M.D.   Dg Lumbar Spine Complete  02/18/2012  *RADIOLOGY REPORT*  Clinical Data: Fall, low back pain  LUMBAR SPINE - COMPLETE 4+ VIEW  Comparison: CT  abdomen pelvis dated 01/08/2007.  Findings: Five lumbar-type vertebral bodies.  Normal lumbar lordosis.  No evidence of fracture or dislocation.  Vertebral body heights are essentially maintained, noting mild stable changes  involving L2.  Moderate to severe multilevel degenerative changes.  IMPRESSION: No fracture or dislocation is seen.  Moderate to severe multilevel degenerative changes.  Original Report Authenticated By: Charline Bills, M.D.   Dg Hip Complete Right  02/18/2012  *RADIOLOGY REPORT*  Clinical Data: Fall, right hip pain  RIGHT HIP - COMPLETE 2+ VIEW  Comparison: None.  Findings: Minimally displaced fracture involving the greater trochanter.  No definite intertrochanteric extension.  Degenerative changes of the lower lumbar spine.  Surgical clips in the pelvis.  IMPRESSION: Minimally displaced fracture involving the right greater trochanter.  No definite intertrochanteric extension.  If the patient is nonweightbearing, consider MRI or CT for further evaluation of the right hip.  Original Report Authenticated By: Charline Bills, M.D.   Ct Head Wo Contrast  02/18/2012  *RADIOLOGY REPORT*  Clinical Data: Intraparenchymal hematoma follow-up.  CT HEAD WITHOUT CONTRAST  Technique:  Contiguous axial images were obtained from the base of the skull through the vertex without contrast.  Comparison: 02/18/2012  Findings: The subcortical right frontal lobe hematoma is slightly larger at 2.6 x 2.4 cm (2.4 x 1.9 cm previously).  There is surrounding edema which results in local sulcal effacement. However, no global mass effect/midline shift.  No new areas of hemorrhage or definitive CT evidence of additional acute infarction. Periventricular and subcortical white matter hypodensities are most in keeping with chronic microangiopathic change.  No overt hydrocephalus.  Partially empty sella.  No abnormal extra-axial fluid collections.  Partially opacified right maxillary sinus is nonspecific. Otherwise, the visualized paranasal sinuses and mastoid air cells are predominately clear. Mild right frontal scalp swelling is similar to prior.  No underlying calvarial fracture.  IMPRESSION: 2.6 x 2.4 cm intraparenchymal hematoma in the  subcortical right frontal lobe, slightly increased in size as above.  Mild surrounding vasogenic edema is similar.  Original Report Authenticated By: Waneta Martins, M.D.   Ct Head Wo Contrast  02/18/2012  *RADIOLOGY REPORT*  Clinical Data: Fall, on blood thinners  CT HEAD WITHOUT CONTRAST  Technique:  Contiguous axial images were obtained from the base of the skull through the vertex without contrast.  Comparison: None.  Findings: 2.4 x 1.9 cm focus of hemorrhage in the subcortical right frontal lobe (series 2/image 17).  Very mild surrounding vasogenic edema.  No mass lesion, mass effect, or midline shift.  No CT evidence of acute infarction.  Subcortical white matter and periventricular small vessel ischemic changes.  Global cortical atrophy with secondary ventricular prominence.  The visualized paranasal sinuses are essentially clear. The mastoid air cells are unopacified.  Very mild soft tissue swelling overlying the right frontal bone. No evidence of calvarial fracture.  IMPRESSION: 2.4 x 1.9 cm intraparenchymal hematoma in the subcortical right frontal lobe.  Mild surrounding vasogenic edema.  Very mild soft tissue swelling overlying the right frontal bone. No underlying calvarial fracture.  Critical Value/emergent results were called by telephone at the time of interpretation on 02/18/2012 at 1145 hours to Dr. Lorenso Courier, who verbally acknowledged these results.  Original Report Authenticated By: Charline Bills, M.D.   Mr [facility] Wo Contrast  02/19/2012  *RADIOLOGY REPORT*  Clinical Data:  78 year old male who was found on the floor this morning.  Question stroke.  The patient states he slipped and fell. He is unable to get up  due to right-sided pain.  Right frontal lobe hemorrhage.  MRI HEAD WITHOUT CONTRAST MRA HEAD WITHOUT CONTRAST  Technique:  Multiplanar, multiecho pulse sequences of the brain and surrounding structures were obtained without intravenous contrast. Angiographic images of the head  were obtained using MRA technique without contrast.  Comparison:  CT head without contrast 02/18/2012.  MRI HEAD  Findings:  A right frontal parenchymal hemorrhage is stable in size.  There is some surrounding edema. A focal area of susceptibility within the right cerebellum is compatible with a remote hemorrhage.  Calcifications are present over the vertex.  No other focal hemorrhages present.  Mild generalized atrophy is present.  Confluent periventricular scattered subcortical T2 and FLAIR hyperintensities are present bilaterally.  The ventricles are proportionate to the degree of atrophy.  No significant extra-axial fluid collection is present.  Flow is present in the major intracranial arteries.  The globes and orbits are intact.  Mild mucosal thickening is present in the anterior ethmoid air cells bilaterally.  Circumferential mucosal thickening is more prominent in the right than left maxillary sinuses.  A small amount of fluid is present in the inferior mastoid air cells bilaterally.  No obstructing nasopharyngeal lesion is present.  Basal ganglia calcifications are noted.  IMPRESSION:  1.  Stable appearance of a 2.6 cm right frontal opercular hemorrhage and surrounding edema. 2.  Focus of remote blood products in the right cerebellum. 3.  No other acute hemorrhage. 4.  Age advanced atrophy and moderate white matter disease.  This is nonspecific, but likely reflects the sequelae of chronic microvascular ischemia.  MRA HEAD  Findings: Mild tortuosity is evident within the cervical left internal carotid artery.  The A1 and M1 segments are normal.  The MCA bifurcations are within normal limits bilaterally.  There is some attenuation of distal MCA branch vessels bilaterally.  No significant proximal stenosis or occlusion is present.  There is no evidence for a significant vascular malformation associated with hemorrhage.  The right vertebral artery is the dominant vessel.  The left vertebral artery essentially  terminates at the PICA.  The basilar artery is within normal limits.  Both posterior cerebral arteries originate from basilar tip.  There is significant contribution from posterior communicating arteries bilaterally, more prominently on the left.  There is tapering of distal PCA branch vessels bilaterally.  IMPRESSION:  1.  Mild tortuosity of the distal cervical internal carotid arteries, more prominent on the left.  This can be seen in the setting of hypertension. 2.  No significant proximal stenosis, aneurysm, or branch vessel occlusion. 3.  No significant vascular malformation associated with hemorrhage. 4.  Tapering of distal branch vessels, compatible with mild small vessel disease.  Original Report Authenticated By: Jamesetta Orleans. MATTERN, M.D.   Mr Brain Wo Contrast  02/19/2012  *RADIOLOGY REPORT*  Clinical Data:  78 year old male who was found on the floor this morning.  Question stroke.  The patient states he slipped and fell. He is unable to get up due to right-sided pain.  Right frontal lobe hemorrhage.  MRI HEAD WITHOUT CONTRAST MRA HEAD WITHOUT CONTRAST  Technique:  Multiplanar, multiecho pulse sequences of the brain and surrounding structures were obtained without intravenous contrast. Angiographic images of the head were obtained using MRA technique without contrast.  Comparison:  CT head without contrast 02/18/2012.  MRI HEAD  Findings:  A right frontal parenchymal hemorrhage is stable in size.  There is some surrounding edema. A focal area of susceptibility within the right cerebellum is compatible  with a remote hemorrhage.  Calcifications are present over the vertex.  No other focal hemorrhages present.  Mild generalized atrophy is present.  Confluent periventricular scattered subcortical T2 and FLAIR hyperintensities are present bilaterally.  The ventricles are proportionate to the degree of atrophy.  No significant extra-axial fluid collection is present.  Flow is present in the major  intracranial arteries.  The globes and orbits are intact.  Mild mucosal thickening is present in the anterior ethmoid air cells bilaterally.  Circumferential mucosal thickening is more prominent in the right than left maxillary sinuses.  A small amount of fluid is present in the inferior mastoid air cells bilaterally.  No obstructing nasopharyngeal lesion is present.  Basal ganglia calcifications are noted.  IMPRESSION:  1.  Stable appearance of a 2.6 cm right frontal opercular hemorrhage and surrounding edema. 2.  Focus of remote blood products in the right cerebellum. 3.  No other acute hemorrhage. 4.  Age advanced atrophy and moderate white matter disease.  This is nonspecific, but likely reflects the sequelae of chronic microvascular ischemia.  MRA HEAD  Findings: Mild tortuosity is evident within the cervical left internal carotid artery.  The A1 and M1 segments are normal.  The MCA bifurcations are within normal limits bilaterally.  There is some attenuation of distal MCA branch vessels bilaterally.  No significant proximal stenosis or occlusion is present.  There is no evidence for a significant vascular malformation associated with hemorrhage.  The right vertebral artery is the dominant vessel.  The left vertebral artery essentially terminates at the PICA.  The basilar artery is within normal limits.  Both posterior cerebral arteries originate from basilar tip.  There is significant contribution from posterior communicating arteries bilaterally, more prominently on the left.  There is tapering of distal PCA branch vessels bilaterally.  IMPRESSION:  1.  Mild tortuosity of the distal cervical internal carotid arteries, more prominent on the left.  This can be seen in the setting of hypertension. 2.  No significant proximal stenosis, aneurysm, or branch vessel occlusion. 3.  No significant vascular malformation associated with hemorrhage. 4.  Tapering of distal branch vessels, compatible with mild small vessel  disease.  Original Report Authenticated By: Jamesetta Orleans. MATTERN, M.D.   Ct Hip Right Wo Contrast  02/18/2012  *RADIOLOGY REPORT*  Clinical Data: Right hip and leg pain following a fall last night. Minimally displaced right greater trochanter fracture on radiographs earlier today.  CT OF THE RIGHT HIP WITHOUT CONTRAST  Technique:  Multidetector CT imaging was performed according to the standard protocol. Multiplanar CT image reconstructions were also generated.  Comparison: Right hip radiographs obtained earlier today.  Findings: Essentially nondisplaced fracture in the lateral aspect of the right greater trochanter.  No intertrochanteric extension or angulation.  No additional fractures are seen.  IMPRESSION: Essentially nondisplaced fracture in the lateral aspect of the right greater trochanter.  Original Report Authenticated By: Darrol Angel, M.D.    DISCHARGE EXAMINATION: Blood pressure 134/59, pulse 60, temperature 98.1 F (36.7 C), temperature source Oral, resp. rate 20, height 6\' 2"  (1.88 m), weight 84.6 kg (186 lb 8.2 oz), SpO2 96.00%. General appearance: alert, cooperative, appears stated age and no distress Resp: clear to auscultation bilaterally Cardio: regular rate and rhythm, S1, S2 normal, no murmur, click, rub or gallop GI: soft, non-tender; bowel sounds normal; no masses,  no organomegaly Extremities: extremities normal, atraumatic, no cyanosis or edema Neurologic: Grossly normal  DISPOSITION: 35 mins  Discharge Orders    Future Appointments:  Provider: Department: Dept Phone: Center:   06/02/2012 10:00 AM Radene Gunning [facility] [phone] None   06/02/2012 10:30 AM Exie Parody, MD [facility] [phone] None     Future Orders Please Complete By Expires   Diet - low sodium heart healthy      Increase activity slowly      Comments:   Ok for weight bearing     Current Discharge Medication List    CONTINUE these medications which have NOT CHANGED   Details    amiodarone (PACERONE) 200 MG tablet Take 200 mg by mouth daily.      atorvastatin (LIPITOR) 20 MG tablet Take 20 mg by mouth daily.      furosemide (LASIX) 20 MG tablet Take 20 mg by mouth daily.     irbesartan (AVAPRO) 150 MG tablet Take 150 mg by mouth at bedtime.   Associated Diagnoses: Hypertension; Anemia; Thrombocytopenia; A-fib; Peripheral vascular disease; Fatigue; Osteoarthritis    potassium chloride SA (K-DUR,KLOR-CON) 20 MEQ tablet Take 20 mEq by mouth daily.        STOP taking these medications     Rivaroxaban (XARELTO) 20 MG TABS        Follow-up Information    Follow up with GRAVES,JOHN L, MD in 3 weeks. (for r. hip fracture.  If no pain can follow up as needed)    Contact information:   [address] [place] [address] [phone]       Follow up with [facility], MD. Schedule an appointment as soon as possible for a visit in 3 weeks. (post hospitalization follow up)    Contact information:   [address] [place] [address] [phone]       Follow up with Gates Rigg, MD. Schedule an appointment as soon as possible for a visit in 2 months. (stroke follow up)    Contact information:   [address], [address] [facility] [place] [address] [phone]          TOTAL DISCHARGE TIME: 35 mins  [facility]  [facility] Pager [phone]  02/25/2012, 10:20 AM

## 2012-02-26 NOTE — Progress Notes (Signed)
Discussed decision to change d/c plan with Wyatt Portela, PTA. Agree this is currently pt's best option.  02/26/2012 [name], PT Pager: [phone]

## 2012-02-27 NOTE — Progress Notes (Signed)
Aware pt is on bedrest.

## 2012-05-15 ENCOUNTER — Other Ambulatory Visit: Payer: Self-pay | Admitting: Internal Medicine

## 2012-05-15 DIAGNOSIS — I619 Nontraumatic intracerebral hemorrhage, unspecified: Secondary | ICD-10-CM

## 2012-05-21 ENCOUNTER — Ambulatory Visit
Admission: RE | Admit: 2012-05-21 | Discharge: 2012-05-21 | Disposition: A | Discharge status: Home / Self Care | Payer: Medicare HMO | Source: Ambulatory Visit | Attending: Internal Medicine | Admitting: Internal Medicine

## 2012-05-21 DIAGNOSIS — I619 Nontraumatic intracerebral hemorrhage, unspecified: Secondary | ICD-10-CM

## 2012-06-01 NOTE — Patient Instructions (Signed)
1.  Issue:  Persistent low pletelete count.  Now also with anemia.  2.  Potential causes:  Myelodysplastic syndrome MDS.   3.  Recommendation:  Bone marrow biopsy.

## 2012-06-02 ENCOUNTER — Other Ambulatory Visit: Payer: Medicare HMO | Admitting: Lab

## 2012-06-02 ENCOUNTER — Ambulatory Visit: Payer: Medicare HMO | Admitting: Oncology

## 2012-06-03 ENCOUNTER — Other Ambulatory Visit: Payer: Self-pay | Admitting: Internal Medicine

## 2012-06-03 DIAGNOSIS — I729 Aneurysm of unspecified site: Secondary | ICD-10-CM

## 2012-06-09 ENCOUNTER — Other Ambulatory Visit: Payer: Medicare HMO

## 2012-06-09 NOTE — Progress Notes (Signed)
No show.  Letter sent 06/09/2012.

## 2012-06-20 ENCOUNTER — Other Ambulatory Visit: Payer: Medicare HMO

## 2012-07-21 ENCOUNTER — Ambulatory Visit
Admission: RE | Admit: 2012-07-21 | Discharge: 2012-07-21 | Disposition: A | Discharge status: Home / Self Care | Payer: Medicare HMO | Source: Ambulatory Visit | Attending: Internal Medicine | Admitting: Internal Medicine

## 2012-07-21 DIAGNOSIS — I729 Aneurysm of unspecified site: Secondary | ICD-10-CM

## 2012-08-05 ENCOUNTER — Telehealth: Payer: Self-pay | Admitting: Oncology

## 2012-08-05 NOTE — Telephone Encounter (Signed)
s.w. pt and gv appt schedule for Feb...Marland Kitchenpt ok

## 2012-08-14 ENCOUNTER — Other Ambulatory Visit: Payer: Self-pay | Admitting: *Deleted

## 2012-08-14 NOTE — Telephone Encounter (Signed)
Called [name] and confirmed he has a Lipitor on file-instructed them to prepare this one for him. The Lasix was ordered by Dr. Bryan Lemma on 07/04/12 and has no refills. Renold Genta with Dr. Elissa Hefty office. She will contact patient regarding the Lasix. Notified patient of Lipitor script already had refill and Dr. Elissa Hefty office will call him about the Lasix.

## 2012-08-14 NOTE — Telephone Encounter (Signed)
Patient calling to request refills on his Lasix 20 mg and Lipitor 20 mg saying he is out of medication. He insists that Dr. Gaylyn Rong is his PCP now. He no longer goes to Dr. Nicholos Johns, but Dr. Gaylyn Rong. Attempted to explain that Dr. Gaylyn Rong only follows him for his blood disorder and does not usually do primary care. He insists he was told to call this office for these. Says he can't wait till MD returns Monday for answer. Informed him that these are not crucial meds and can wait until Monday when MD is back in office.

## 2012-08-20 ENCOUNTER — Other Ambulatory Visit (HOSPITAL_COMMUNITY): Payer: Self-pay | Admitting: Cardiology

## 2012-08-20 DIAGNOSIS — R209 Unspecified disturbances of skin sensation: Secondary | ICD-10-CM

## 2012-08-20 DIAGNOSIS — R0989 Other specified symptoms and signs involving the circulatory and respiratory systems: Secondary | ICD-10-CM

## 2012-08-25 ENCOUNTER — Other Ambulatory Visit: Payer: Medicare HMO | Admitting: Lab

## 2012-08-25 ENCOUNTER — Ambulatory Visit: Payer: Medicare HMO | Admitting: Oncology

## 2012-08-25 NOTE — Progress Notes (Signed)
Multiple no shows despite letter sent.  Discharged from clinica.  Return/reschedule prn.

## 2012-09-09 ENCOUNTER — Encounter (HOSPITAL_COMMUNITY): Payer: Medicare HMO

## 2012-11-10 ENCOUNTER — Ambulatory Visit: Payer: Self-pay | Admitting: Nurse Practitioner

## 2012-11-10 DIAGNOSIS — Z0289 Encounter for other administrative examinations: Secondary | ICD-10-CM

## 2012-12-24 ENCOUNTER — Other Ambulatory Visit: Payer: Self-pay | Admitting: *Deleted

## 2012-12-24 MED ORDER — ATORVASTATIN CALCIUM 20 MG PO TABS: 20.0000 mg | ORAL_TABLET | Freq: Every day | ORAL | Status: DC

## 2013-03-20 ENCOUNTER — Encounter: Payer: Self-pay | Admitting: Physician Assistant

## 2013-03-20 DIAGNOSIS — I272 Pulmonary hypertension, unspecified: Secondary | ICD-10-CM

## 2013-03-20 DIAGNOSIS — Z7901 Long term (current) use of anticoagulants: Secondary | ICD-10-CM | POA: Insufficient documentation

## 2013-03-24 ENCOUNTER — Encounter: Payer: Self-pay | Admitting: Cardiology

## 2013-03-24 ENCOUNTER — Ambulatory Visit (INDEPENDENT_AMBULATORY_CARE_PROVIDER_SITE_OTHER): Payer: Medicare HMO | Admitting: Physician Assistant

## 2013-03-24 ENCOUNTER — Encounter: Payer: Self-pay | Admitting: Physician Assistant

## 2013-03-24 VITALS — BP 140/78 | HR 50 | Ht 74.0 in | Wt 191.3 lb

## 2013-03-24 DIAGNOSIS — I4891 Unspecified atrial fibrillation: Secondary | ICD-10-CM

## 2013-03-24 DIAGNOSIS — I739 Peripheral vascular disease, unspecified: Secondary | ICD-10-CM

## 2013-03-24 DIAGNOSIS — I10 Essential (primary) hypertension: Secondary | ICD-10-CM

## 2013-03-24 NOTE — Assessment & Plan Note (Signed)
Blood pressure is mildly elevated. We'll make no changes at this time

## 2013-03-24 NOTE — Assessment & Plan Note (Signed)
He is complaining of lower extremity weakness in his feet being cold all the time. He does have an outstanding order for lower extremity arterial Dopplers.  We will go ahead and get this scheduled in the next few weeks.

## 2013-03-24 NOTE — Assessment & Plan Note (Signed)
He is currently in sinus bradycardia with a rate of 50 beats per minute.

## 2013-03-24 NOTE — Patient Instructions (Signed)
You will be scheduled for lower extremity arterial ultrasound.  Followup with Dr. Herbie Baltimore in 6 months or sooner depending on the results of the test.

## 2013-03-24 NOTE — Progress Notes (Signed)
Date:  03/24/2013   ID:  [name], DOB [birth year], MRN [id number]  PCP:  No primary provider on file.  Primary Cardiologist:  Herbie Baltimore     History of Present Illness: [name] is a 79 y.o. male a history of paroxysmal atrial fibrillation who was on Xarelto, pulmonary hypertension, hypertension, remote ST elevation myocardial infarction 20 years ago, peripheral neuropathy, cervical spine disease.  Most recent 2-D echocardiogram was 02/19/2012 and revealed an ejection fraction of 65-70% and normal wall motion. Mild MR. left atrium moderately dilated. Right atrium severely dilated. Moderate tricuspid valve regurgitation. Peak PA pressure 76 mmHg.  Carotid Dopplers 02/19/2012 which showed no significant extracranial carotid R. stenosis. Vertebrals were patent with antegrade flow.  Xarelto was discontinued after intracranial bleed July of 2013 due to trauma from a fall.  According to Dr. Elissa Hefty last note the patient was going to restart Xarelto.  Patient presents today for six-month followup. We talked about Xarelto and the patient stated that he did not restart it because he had a previous issue with severe bleeding after biting his tongue.  He does state that his feet continue to be cold all the time he has weakness in his legs.   He otherwise denies nausea, vomiting, fever, chest pain, shortness of breath, orthopnea, dizziness, PND, cough, congestion, abdominal pain, hematochezia, melena, lower extremity edema, claudication.  Wt Readings from Last 3 Encounters:  03/24/13 191 lb 4.8 oz (86.773 kg)  02/20/12 186 lb 8.2 oz (84.6 kg)  10/03/11 190 lb 9.6 oz (86.456 kg)     Past Medical History  Diagnosis Date  . Hypertension   . Anemia   . Thrombocytopenia   . A-fib   . Peripheral vascular disease   . Fatigue   . Osteoarthritis   . Hyperlipidemia     Current Outpatient Prescriptions  Medication Sig Dispense Refill  . amiodarone (PACERONE) 200 MG tablet Take 200 mg by mouth  daily.        Marland Kitchen atorvastatin (LIPITOR) 20 MG tablet Take 1 tablet (20 mg total) by mouth daily.  30 tablet  6  . furosemide (LASIX) 20 MG tablet Take 20 mg by mouth daily.       . irbesartan (AVAPRO) 150 MG tablet Take 150 mg by mouth at bedtime.      . potassium chloride SA (K-DUR,KLOR-CON) 20 MEQ tablet Take 20 mEq by mouth daily.        . [DISCONTINUED] lisinopril (PRINIVIL,ZESTRIL) 20 MG tablet Take 20 mg by mouth daily.         No current facility-administered medications for this visit.    Allergies:    Allergies  Allergen Reactions  . Lisinopril     Patient states it causes runny nose, itchiness, and allergy-type symptoms.    Social History:  The patient  reports that he has never smoked. He has never used smokeless tobacco. He reports that he does not drink alcohol or use illicit drugs.   Family history:   Family History  Problem Relation Age of Onset  . Alzheimer's disease Mother 55  . Cancer Father 24    prostate cancer  . Cancer Brother     unknown cancer    ROS:  Please see the history of present illness.  All other systems reviewed and negative.   PHYSICAL EXAM: VS:  BP 140/78  Pulse 50  Ht 6\' 2"  (1.88 m)  Wt 191 lb 4.8 oz (86.773 kg)  BMI 24.55 kg/m2 Well nourished,  well developed, in no acute distress HEENT: Pupils are equal round react to light accommodation extraocular movements are intact.  Neck: no JVDNo cervical lymphadenopathy. Cardiac: Regular rate and rhythm without murmurs rubs or gallops. Lungs:  clear to auscultation bilaterally, no wheezing, rhonchi or rales Abd: soft, nontender, positive bowel sounds all quadrants, no hepatosplenomegaly Ext: no lower extremity edema.  2+ radial and 1+ left dorsalis pedis and posterior tibialis pulses. 2+ right posterior tibialis and 0 dorsalis pedis Skin: warm and dry Neuro:  Grossly normal  EKG: Sinus bradycardia rate 50 beats per minute ASSESSMENT AND PLAN:  Problem List Items Addressed This Visit    Peripheral vascular disease      He is complaining of lower extremity weakness in his feet being cold all the time. He does have an outstanding order for lower extremity arterial Dopplers.  We will go ahead and get this scheduled in the next few weeks.    Hypertension     Blood pressure is mildly elevated. We'll make no changes at this time    A-fib - Primary     He is currently in sinus bradycardia with a rate of 50 beats per minute.    Relevant Orders      EKG 12-Lead

## 2013-04-17 ENCOUNTER — Ambulatory Visit (HOSPITAL_COMMUNITY)
Admission: RE | Admit: 2013-04-17 | Discharge: 2013-04-17 | Disposition: A | Discharge status: Home / Self Care | Payer: Medicare HMO | Source: Ambulatory Visit | Attending: Cardiovascular Disease | Admitting: Cardiovascular Disease

## 2013-04-17 DIAGNOSIS — R0989 Other specified symptoms and signs involving the circulatory and respiratory systems: Secondary | ICD-10-CM | POA: Insufficient documentation

## 2013-04-17 DIAGNOSIS — R209 Unspecified disturbances of skin sensation: Secondary | ICD-10-CM

## 2013-04-17 NOTE — Progress Notes (Signed)
Arterial Lower Ext. Duplex Completed. Ajene Carchi, BS, RDMS, RVT  

## 2013-04-23 ENCOUNTER — Telehealth: Payer: Self-pay | Admitting: *Deleted

## 2013-04-23 NOTE — Telephone Encounter (Signed)
Message copied by Tobin Chad on Thu Apr 23, 2013  4:34 PM ------      Message from: Herbie Baltimore, DAVID W      Created: Tue Apr 21, 2013  3:12 PM       Mild abnormalities - overall not bad; one of 3 arteries on the R Lower Leg is blocked with good flow from the other 2.            Will monitor & restudy based upon symptoms.            Marykay Lex, MD       ------

## 2013-04-23 NOTE — Telephone Encounter (Signed)
Awaiting call back .concerning doppler report

## 2013-04-27 NOTE — Telephone Encounter (Signed)
Wants doppler results from the week of the 22nd please.

## 2013-04-27 NOTE — Telephone Encounter (Signed)
Doppler results called to patient.  Voiced understanding.  Instructed to call if he has any problems or increasing symptoms.

## 2013-05-11 ENCOUNTER — Telehealth: Payer: Self-pay | Admitting: *Deleted

## 2013-05-11 NOTE — Telephone Encounter (Signed)
Left message to call back concerning LEA doppler.

## 2013-05-11 NOTE — Telephone Encounter (Signed)
Message copied by Tobin Chad on Mon May 11, 2013  2:58 PM ------      Message from: [facility], DAVID W      Created: Tue Apr 21, 2013  3:12 PM       Mild abnormalities - overall not bad; one of 3 arteries on the R Lower Leg is blocked with good flow from the other 2.            Will monitor & restudy based upon symptoms.            Marykay Lex, MD       ------

## 2013-06-28 ENCOUNTER — Emergency Department (HOSPITAL_COMMUNITY): Payer: Medicare HMO

## 2013-06-28 ENCOUNTER — Encounter (HOSPITAL_COMMUNITY): Payer: Self-pay | Admitting: Emergency Medicine

## 2013-06-28 ENCOUNTER — Emergency Department (HOSPITAL_COMMUNITY)
Admission: EM | Admit: 2013-06-28 | Discharge: 2013-06-29 | Disposition: A | Discharge status: Home / Self Care | Payer: Medicare HMO | Attending: Emergency Medicine | Admitting: Emergency Medicine

## 2013-06-28 DIAGNOSIS — Z7901 Long term (current) use of anticoagulants: Secondary | ICD-10-CM | POA: Insufficient documentation

## 2013-06-28 DIAGNOSIS — W108XXA Fall (on) (from) other stairs and steps, initial encounter: Secondary | ICD-10-CM | POA: Insufficient documentation

## 2013-06-28 DIAGNOSIS — D649 Anemia, unspecified: Secondary | ICD-10-CM | POA: Insufficient documentation

## 2013-06-28 DIAGNOSIS — R51 Headache: Secondary | ICD-10-CM | POA: Insufficient documentation

## 2013-06-28 DIAGNOSIS — E785 Hyperlipidemia, unspecified: Secondary | ICD-10-CM | POA: Insufficient documentation

## 2013-06-28 DIAGNOSIS — S0003XA Contusion of scalp, initial encounter: Secondary | ICD-10-CM | POA: Insufficient documentation

## 2013-06-28 DIAGNOSIS — Z79899 Other long term (current) drug therapy: Secondary | ICD-10-CM | POA: Insufficient documentation

## 2013-06-28 DIAGNOSIS — M1990 Unspecified osteoarthritis, unspecified site: Secondary | ICD-10-CM | POA: Insufficient documentation

## 2013-06-28 DIAGNOSIS — Y92009 Unspecified place in unspecified non-institutional (private) residence as the place of occurrence of the external cause: Secondary | ICD-10-CM | POA: Insufficient documentation

## 2013-06-28 DIAGNOSIS — Y9301 Activity, walking, marching and hiking: Secondary | ICD-10-CM | POA: Insufficient documentation

## 2013-06-28 DIAGNOSIS — R269 Unspecified abnormalities of gait and mobility: Secondary | ICD-10-CM | POA: Insufficient documentation

## 2013-06-28 DIAGNOSIS — I739 Peripheral vascular disease, unspecified: Secondary | ICD-10-CM | POA: Insufficient documentation

## 2013-06-28 DIAGNOSIS — W010XXA Fall on same level from slipping, tripping and stumbling without subsequent striking against object, initial encounter: Secondary | ICD-10-CM | POA: Insufficient documentation

## 2013-06-28 DIAGNOSIS — Z888 Allergy status to other drugs, medicaments and biological substances status: Secondary | ICD-10-CM | POA: Insufficient documentation

## 2013-06-28 DIAGNOSIS — D696 Thrombocytopenia, unspecified: Secondary | ICD-10-CM | POA: Insufficient documentation

## 2013-06-28 DIAGNOSIS — I4891 Unspecified atrial fibrillation: Secondary | ICD-10-CM | POA: Insufficient documentation

## 2013-06-28 DIAGNOSIS — I10 Essential (primary) hypertension: Secondary | ICD-10-CM | POA: Insufficient documentation

## 2013-06-28 DIAGNOSIS — M542 Cervicalgia: Secondary | ICD-10-CM

## 2013-06-28 DIAGNOSIS — S0990XA Unspecified injury of head, initial encounter: Secondary | ICD-10-CM

## 2013-06-28 MED ORDER — NAPROXEN 500 MG PO TABS: 500.0000 mg | ORAL_TABLET | Freq: Two times a day (BID) | ORAL | Status: DC

## 2013-06-28 NOTE — ED Provider Notes (Signed)
CSN: [id number]     Arrival date & time 06/28/13  2145 History   First MD Initiated Contact with Patient 06/28/13 2157     Chief Complaint  Patient presents with  . Fall   (Consider location/radiation/quality/duration/timing/severity/associated sxs/prior Treatment) HPI Comments: 79 year old male with history of hypertension and atrial fibrillation who presents after having 2 falls in the last 24 hours. The patient and family report that the patient fell last night at 1:00 in the morning while he was ambulating down the hallway when he states that his knee gave out causing him to stumble and fall into a bedroom striking his head on the bedpost of the bed. There was no loss of consciousness, he was immediately able to get himself off the ground and continue ambulating. He did have a mild headache at that time. Throughout the day today he has had continued difficulty because he feels like his knees give out, he states that he has had problems with his knees for years and that this is not a new problem. As he was trying to leave the house this evening to come to the hospital to get checked out he tripped coming down the stairs again blaming his knee giving out causing him to fall to the ground. The paramedics were able to help him up off the ground, he was again able to ambulate and does not complain of any pain in his legs, his arms, his chest, back or abdomen. He does feel like he has a popping sensation in his neck since his fall last night, he does not have a headache, blurred vision, weakness or numbness. He has been treated with anticoagulants because of his atrial fibrillation. He is currently on xarelto  Patient is a 79 y.o. male presenting with fall. The history is provided by the patient, the spouse and a relative.  Fall    Past Medical History  Diagnosis Date  . Hypertension   . Anemia   . Thrombocytopenia   . A-fib   . Peripheral vascular disease   . Fatigue   . Osteoarthritis   .  Hyperlipidemia    Past Surgical History  Procedure Laterality Date  . Neck surgery      Multiple-ACDF C3-C4--5/12. ACDF revision C4-C7--11/02  . Prostatectomy    . 2-d echocardiogram  02/19/2012    Ejection fraction 65-70%. Normal wall motion. Normal diastolic function parameters. Mild MR. Moderate left atrium dilatation. Moderate TR. Right atrium severely dilated. PA pressure was 76 mmHg  . Persantine myoview stress test  11/13/2010    No reversible ischemia. Small to moderate fixed inferior to inferior apical defect   Family History  Problem Relation Age of Onset  . Alzheimer's disease Mother 92  . Cancer Father 80    prostate cancer  . Cancer Brother     unknown cancer   History  Substance Use Topics  . Smoking status: Never Smoker   . Smokeless tobacco: Never Used  . Alcohol Use: No    Review of Systems  All other systems reviewed and are negative.    Allergies  Lisinopril  Home Medications   Current Outpatient Rx  Name  Route  Sig  Dispense  Refill  . amiodarone (PACERONE) 200 MG tablet   Oral   Take 200 mg by mouth daily.           Marland Kitchen atorvastatin (LIPITOR) 20 MG tablet   Oral   Take 1 tablet (20 mg total) by mouth daily.   30  tablet   6   . furosemide (LASIX) 20 MG tablet   Oral   Take 20 mg by mouth daily.          . irbesartan (AVAPRO) 150 MG tablet   Oral   Take 150 mg by mouth at bedtime.         . potassium chloride SA (K-DUR,KLOR-CON) 20 MEQ tablet   Oral   Take 20 mEq by mouth daily.           . naproxen (NAPROSYN) 500 MG tablet   Oral   Take 1 tablet (500 mg total) by mouth 2 (two) times daily with a meal.   30 tablet   0    BP 152/61  Pulse 70  Temp(Src) 98.5 F (36.9 C) (Oral)  Resp 14  Ht 6\' 2"  (1.88 m)  Wt 190 lb (86.183 kg)  BMI 24.38 kg/m2  SpO2 100% Physical Exam  Nursing note and vitals reviewed. Constitutional: He appears well-developed and well-nourished. No distress.  HENT:  Head: Normocephalic.   Mouth/Throat: Oropharynx is clear and moist. No oropharyngeal exudate.  Small hematoma to the right forehead, no malocclusion, no raccoon eyes, no battle sign, no hemotympanum. Oropharynx is clear and moist  Eyes: Conjunctivae and EOM are normal. Pupils are equal, round, and reactive to light. Right eye exhibits no discharge. Left eye exhibits no discharge. No scleral icterus.  Neck: No JVD present. No thyromegaly present.  Cardiovascular: Normal rate, regular rhythm, normal heart sounds and intact distal pulses.  Exam reveals no gallop and no friction rub.   No murmur heard. Pulmonary/Chest: Effort normal and breath sounds normal. No respiratory distress. He has no wheezes. He has no rales. He exhibits no tenderness.  Abdominal: Soft. Bowel sounds are normal. He exhibits no distension and no mass. There is no tenderness.  Musculoskeletal: Normal range of motion. He exhibits no edema and no tenderness.  The patient is able to straight leg raise the week and only lift his legs off the bed a couple of inches. He states this is normal for him and it has been this way for years. There is no edema or swelling or asymmetry of the lower extremities.  Lymphadenopathy:    He has no cervical adenopathy.  Neurological: He is alert. Coordination normal.  Speech is normal, coordination is normal, strength and sensation of all 4 extremities is at the patient's baseline. Cranial nerves III through XII appear to be intact.  Skin: Skin is warm and dry. No rash noted. No erythema.  Psychiatric: He has a normal mood and affect. His behavior is normal.    ED Course  Procedures (including critical care time) Labs Review Labs Reviewed  CBC  BASIC METABOLIC PANEL   Imaging Review No results found.  EKG Interpretation   None       MDM   1. Minor head injury, initial encounter   2. Neck pain   3. Gait abnormality    At this time the patient does have minor injuries to his head and neck, he will need  imaging to rule out fractures, dislocations, hemorrhage in those areas. His lower extremities are both very stiff at the hips, knees and ankles. There is no leg length discrepancy, there does not appear to be any signs of fractures or acute trauma. I do not feel that his visit at the emergency department is the appropriate place to further workup these chronic stiffness issues. As there is no acute trauma imaging is not  indicated at this time. I will check to make sure that he has not had any other sources of the fall including anemia as he does report a history of a GI bleed. He denies any GI bleeding at this time. Vital signs otherwise appear normal.  At change of shift, CT scan imaging pending, labs pending, the patient remained stable.  Change of shift - care signed out to Dr. Read Drivers.  Vida Roller, MD 06/30/13 [phone]

## 2013-06-28 NOTE — ED Notes (Signed)
PTAR reports that pt fell twice today, pt family was trying to bring him to the Ed when he fell for the 2nd time tonight down 2 steps, denies any LOC or head injury. Pt denies any injury or pain anywhere. Pt states he has been feeling "weak" for a couple of months and reports "leg problems" for a long time. Pt has hx of a-fib, cbg was 100.

## 2013-07-19 ENCOUNTER — Encounter (HOSPITAL_COMMUNITY): Payer: Self-pay | Admitting: Emergency Medicine

## 2013-07-19 ENCOUNTER — Emergency Department (HOSPITAL_COMMUNITY)
Admission: EM | Admit: 2013-07-19 | Discharge: 2013-07-19 | Disposition: A | Discharge status: Home / Self Care | Payer: Medicare HMO | Attending: Emergency Medicine | Admitting: Emergency Medicine

## 2013-07-19 ENCOUNTER — Emergency Department (HOSPITAL_COMMUNITY): Payer: Medicare HMO

## 2013-07-19 DIAGNOSIS — K5900 Constipation, unspecified: Secondary | ICD-10-CM | POA: Insufficient documentation

## 2013-07-19 DIAGNOSIS — R0602 Shortness of breath: Secondary | ICD-10-CM | POA: Insufficient documentation

## 2013-07-19 DIAGNOSIS — J90 Pleural effusion, not elsewhere classified: Secondary | ICD-10-CM

## 2013-07-19 DIAGNOSIS — J3489 Other specified disorders of nose and nasal sinuses: Secondary | ICD-10-CM | POA: Insufficient documentation

## 2013-07-19 DIAGNOSIS — E785 Hyperlipidemia, unspecified: Secondary | ICD-10-CM | POA: Insufficient documentation

## 2013-07-19 DIAGNOSIS — I10 Essential (primary) hypertension: Secondary | ICD-10-CM | POA: Insufficient documentation

## 2013-07-19 DIAGNOSIS — Z79899 Other long term (current) drug therapy: Secondary | ICD-10-CM | POA: Insufficient documentation

## 2013-07-19 DIAGNOSIS — I4891 Unspecified atrial fibrillation: Secondary | ICD-10-CM | POA: Insufficient documentation

## 2013-07-19 DIAGNOSIS — I739 Peripheral vascular disease, unspecified: Secondary | ICD-10-CM | POA: Insufficient documentation

## 2013-07-19 DIAGNOSIS — Z888 Allergy status to other drugs, medicaments and biological substances status: Secondary | ICD-10-CM | POA: Insufficient documentation

## 2013-07-19 DIAGNOSIS — D696 Thrombocytopenia, unspecified: Secondary | ICD-10-CM | POA: Insufficient documentation

## 2013-07-19 DIAGNOSIS — M1990 Unspecified osteoarthritis, unspecified site: Secondary | ICD-10-CM | POA: Insufficient documentation

## 2013-07-19 DIAGNOSIS — D649 Anemia, unspecified: Secondary | ICD-10-CM | POA: Insufficient documentation

## 2013-07-19 MED ORDER — MOXIFLOXACIN HCL 400 MG PO TABS: 400.0000 mg | ORAL_TABLET | Freq: Every day | ORAL | Status: DC

## 2013-07-19 MED ORDER — ALBUTEROL SULFATE HFA 108 (90 BASE) MCG/ACT IN AERS: 1.0000 | INHALATION_SPRAY | Freq: Four times a day (QID) | RESPIRATORY_TRACT | Status: DC | PRN

## 2013-07-19 MED ORDER — BENZONATATE 100 MG PO CAPS: 100.0000 mg | ORAL_CAPSULE | Freq: Two times a day (BID) | ORAL | Status: DC | PRN

## 2013-07-19 NOTE — ED Notes (Signed)
Pt and family member reports pt having chest congestion and productive cough with green/yellow sputum. Congestion has been getting progressively worse. Airway is intact at triage, spo2 99%.

## 2013-07-19 NOTE — ED Provider Notes (Signed)
CSN: [id number]     Arrival date & time 07/19/13  0915 History   First MD Initiated Contact with Patient 07/19/13 1013     Chief Complaint  Patient presents with  . Cough   (Consider location/radiation/quality/duration/timing/severity/associated sxs/prior Treatment) The history is provided by the patient and the spouse.   Pt has had productive cough and nasal congestion x 3-4 weeks.  +sick contacts in his family with similar symptoms.  Sputum is yellow/green.  Cough is worse at night.  Associated occasional SOB. Denies sore throat, current SOB, chest pain or tightness, leg swelling.  Has been taking Robitussin without improvement.   Past Medical History  Diagnosis Date  . Hypertension   . Anemia   . Thrombocytopenia   . A-fib   . Peripheral vascular disease   . Fatigue   . Osteoarthritis   . Hyperlipidemia    Past Surgical History  Procedure Laterality Date  . Neck surgery      Multiple-ACDF C3-C4--5/12. ACDF revision C4-C7--11/02  . Prostatectomy    . 2-d echocardiogram  02/19/2012    Ejection fraction 65-70%. Normal wall motion. Normal diastolic function parameters. Mild MR. Moderate left atrium dilatation. Moderate TR. Right atrium severely dilated. PA pressure was 76 mmHg  . Persantine myoview stress test  11/13/2010    No reversible ischemia. Small to moderate fixed inferior to inferior apical defect   Family History  Problem Relation Age of Onset  . Alzheimer's disease Mother 83  . Cancer Father 76    prostate cancer  . Cancer Brother     unknown cancer   History  Substance Use Topics  . Smoking status: Never Smoker   . Smokeless tobacco: Never Used  . Alcohol Use: No    Review of Systems  Constitutional: Negative for fever and chills.  HENT: Negative for sore throat and trouble swallowing.   Respiratory: Positive for cough. Negative for chest tightness and shortness of breath.   Cardiovascular: Negative for chest pain.  Gastrointestinal: Positive for  constipation. Negative for nausea, vomiting, abdominal pain and diarrhea.  Genitourinary: Negative for dysuria.  All other systems reviewed and are negative.    Allergies  Lisinopril  Home Medications   Current Outpatient Rx  Name  Route  Sig  Dispense  Refill  . irbesartan (AVAPRO) 150 MG tablet   Oral   Take 150 mg by mouth at bedtime.         . potassium chloride SA (K-DUR,KLOR-CON) 20 MEQ tablet   Oral   Take 10 mEq by mouth daily.          Marland Kitchen amiodarone (PACERONE) 200 MG tablet   Oral   Take 200 mg by mouth daily.           Marland Kitchen atorvastatin (LIPITOR) 20 MG tablet   Oral   Take 1 tablet (20 mg total) by mouth daily.   30 tablet   6   . furosemide (LASIX) 20 MG tablet   Oral   Take 20 mg by mouth daily.           BP 146/79  Pulse 63  Temp(Src) 98 F (36.7 C) (Oral)  Resp 22  Wt 190 lb (86.183 kg)  SpO2 99% Physical Exam  Nursing note and vitals reviewed. Constitutional: He appears well-developed and well-nourished. No distress.  HENT:  Head: Normocephalic and atraumatic.  Neck: Neck supple.  Cardiovascular: Normal rate and regular rhythm.   Pulmonary/Chest: Effort normal and breath sounds normal. No stridor. No respiratory  distress. He has no wheezes. He has no rales. He exhibits no tenderness.  Abdominal: Soft. He exhibits no distension and no mass. There is no tenderness. There is no rebound and no guarding.  Lymphadenopathy:    He has no cervical adenopathy.  Neurological: He is alert. He exhibits normal muscle tone.  Skin: He is not diaphoretic.    ED Course  Procedures (including critical care time) Labs Review Labs Reviewed - No data to display Imaging Review Dg Chest 2 View  07/19/2013   CLINICAL DATA:  Cough and congestion for 1 month, hypertension  EXAM: CHEST  2 VIEW  COMPARISON:  02/18/2012  FINDINGS: Normal heart size and pulmonary vascularity.  Tortuous thoracic aorta.  Tiny right pleural effusion.  Lungs otherwise clear.  Suspect  underlying emphysematous changes.  No pneumothorax or acute osseous findings.  Scattered degenerative disc disease changes thoracic spine.  IMPRESSION: Probable COPD changes with tiny right pleural effusion.   Electronically Signed   By: Ulyses Southward M.D.   On: 07/19/2013 10:24    EKG Interpretation   None      Discussed pt with Dr Wilkie Aye  Medications discussed with [facility] pharmacist Marcelino Duster.   MDM   1. Pleural effusion, right    Pt presents with 3-4 weeks of coughing and occasional mild SOB. Cough is productive.  He is afebrile, nontoxic, lungs are clear on exam, O2 is 99% room air.  CXR shows tiny right pleural effusion, will treat with antibiotics.  Discussed antibiotic choice with pharmacist given interactions noted by computer with amiodarone.  Pt d/c home with PCP follow up.  D/C with avelox, tessalon perles, albuterol.  Pt aware he will need follow up to ensure resolution of effusion.  Discussed result, findings, treatment, and follow up  with patient.  Pt given return precautions.  Pt verbalizes understanding and agrees with plan.        Trixie Dredge, PA-C 07/19/13 1126

## 2013-07-19 NOTE — ED Provider Notes (Signed)
Medical screening examination/treatment/procedure(s) were conducted as a shared visit with non-physician practitioner(s) and myself.  I personally evaluated the patient during the encounter.  EKG Interpretation   None      Patient with cough.  NOntoxic on exam and BS clear.  Effusion on xray.Afebrile.  Given effusion, will treat for CAP and treat cough with albuterol.   Shon Baton, MD 07/19/13 [phone]

## 2013-08-30 ENCOUNTER — Other Ambulatory Visit: Payer: Self-pay | Admitting: Cardiology

## 2013-08-31 NOTE — Telephone Encounter (Signed)
Rx was sent to pharmacy electronically. 

## 2013-09-30 ENCOUNTER — Telehealth: Payer: Self-pay | Admitting: Cardiology

## 2013-09-30 NOTE — Telephone Encounter (Signed)
Would like a for us to send him a list of his prescriptions, so that his new doctor will not prescribe him the same medication .Marland Kitchen. Please call   Thanks

## 2013-09-30 NOTE — Telephone Encounter (Signed)
No answer , mailbox is full , will call back later  Will informed patient to take his medication with him to the new doctors appointment

## 2013-09-30 NOTE — Telephone Encounter (Signed)
Call again, no answer

## 2013-10-01 NOTE — Telephone Encounter (Signed)
Med list printed and mailed.  

## 2013-11-04 ENCOUNTER — Telehealth: Payer: Self-pay | Admitting: *Deleted

## 2013-11-27 NOTE — Telephone Encounter (Signed)
Encounter Closed---5/8 TP 

## 2014-03-08 ENCOUNTER — Other Ambulatory Visit: Payer: Self-pay | Admitting: Family Medicine

## 2014-03-08 DIAGNOSIS — R410 Disorientation, unspecified: Secondary | ICD-10-CM

## 2014-03-08 DIAGNOSIS — R29898 Other symptoms and signs involving the musculoskeletal system: Secondary | ICD-10-CM

## 2014-03-12 ENCOUNTER — Ambulatory Visit
Admission: RE | Admit: 2014-03-12 | Discharge: 2014-03-12 | Disposition: A | Discharge status: Home / Self Care | Payer: Medicare HMO | Source: Ambulatory Visit | Attending: Family Medicine | Admitting: Family Medicine

## 2014-03-12 DIAGNOSIS — R29898 Other symptoms and signs involving the musculoskeletal system: Secondary | ICD-10-CM

## 2014-03-12 DIAGNOSIS — R410 Disorientation, unspecified: Secondary | ICD-10-CM

## 2014-03-30 ENCOUNTER — Other Ambulatory Visit: Payer: Self-pay | Admitting: Orthopedic Surgery

## 2014-03-31 ENCOUNTER — Encounter (HOSPITAL_BASED_OUTPATIENT_CLINIC_OR_DEPARTMENT_OTHER): Payer: Self-pay | Admitting: *Deleted

## 2014-03-31 NOTE — Progress Notes (Signed)
03/31/14 1308  OBSTRUCTIVE SLEEP APNEA  Have you ever been diagnosed with sleep apnea through a sleep study? No  Do you snore loudly (loud enough to be heard through closed doors)?  0  Do you often feel tired, fatigued, or sleepy during the daytime? 0  Has anyone observed you stop breathing during your sleep? 0  Do you have, or are you being treated for high blood pressure? 1  BMI more than 35 kg/m2? 0  Age over 78 years old? 1  Neck circumference greater than 40 cm/16 inches? 1  Gender: 1  Obstructive Sleep Apnea Score 4  Score 4 or greater  Results sent to PCP

## 2014-03-31 NOTE — Progress Notes (Signed)
Talked with wife-pt gets confused at times. He still cares for self and drives, the daughters take him to dr. Huel Cote will bring him for ekg-bmet-has just been i yr since seeing cardiology-last stress and echo good

## 2014-04-01 NOTE — H&P (Signed)
[name] is an 80 y.o. male.   Chief Complaint: Left knee Pain  HPI: Patient presents with a chief complaint of left knee pain for the last 6 months that is intermittent he has some days where does not hurt at all and other days where it hurts every time he sits for more than 5 or 10 min. the pain is primarily along the medial joint line, sometimes in the axial locks up and swells for the last 2 days he's done well and he can ambulate easily with a single cane at age 21.  There is occasional numbness going down the leg occasional weakness.  He's had a couple falling episodes over the last couple of months when the pain strikes, it can be sudden and severe.  Meloxicam has not helped.  He denies any recent or distant trauma to his knee.  Past Medical History  Diagnosis Date  . Hypertension   . Anemia   . Thrombocytopenia   . A-fib   . Peripheral vascular disease   . Fatigue   . Osteoarthritis   . Hyperlipidemia   . Dysrhythmia     hx af    Past Surgical History  Procedure Laterality Date  . Neck surgery      Multiple-ACDF C3-C4--5/12. ACDF revision C4-C7--11/02  . Prostatectomy  1992  . 2-d echocardiogram  02/19/2012    Ejection fraction 65-70%. Normal wall motion. Normal diastolic function parameters. Mild MR. Moderate left atrium dilatation. Moderate TR. Right atrium severely dilated. PA pressure was 76 mmHg  . Persantine myoview stress test  11/13/2010    No reversible ischemia. Small to moderate fixed inferior to inferior apical defect  . Colonoscopy    . Breast surgery      Family History  Problem Relation Age of Onset  . Alzheimer's disease Mother 74  . Cancer Father 32    prostate cancer  . Cancer Brother     unknown cancer   Social History:  reports that he quit smoking about 45 years ago. He has never used smokeless tobacco. He reports that he does not drink alcohol or use illicit drugs.  Allergies:  Allergies  Allergen Reactions  . Lisinopril     Patient  states it causes runny nose, itchiness, and allergy-type symptoms.    No prescriptions prior to admission    No results found for this or any previous visit (from the past 48 hour(s)). No results found.  Review of Systems  Constitutional: Negative.   HENT: Positive for tinnitus.   Eyes: Positive for blurred vision.  Respiratory: Negative.   Cardiovascular: Negative.   Gastrointestinal: Positive for abdominal pain and constipation.  Genitourinary: Negative.   Musculoskeletal: Positive for joint pain.  Skin: Negative.   Neurological: Positive for dizziness.  Psychiatric/Behavioral: Negative.     There were no vitals taken for this visit. Physical Exam  Constitutional: He is oriented to person, place, and time. He appears well-developed and well-nourished.  HENT:  Head: Normocephalic and atraumatic.  Eyes: Pupils are equal, round, and reactive to light.  Neck: Normal range of motion. Neck supple.  Cardiovascular: Intact distal pulses.   Respiratory: Effort normal.  Musculoskeletal: He exhibits tenderness.  The left knee today has no effusion range of motion is 0/135, the lateral ligaments are stable, today he is nontender along the medial joint line, although when he has the pain he points to the medial posterior aspect of the joint line.  Lachman's test is negative.  Neurological: He is  alert and oriented to person, place, and time.  Skin: Skin is warm and dry.  Psychiatric: He has a normal mood and affect. His behavior is normal. Judgment and thought content normal.     Assessment/Plan Assess: 6 month history of intermittent catching locking and severe pain in the left knee consistent with medial meniscal tear versus a loose body.  Plan: Options were discussed at length an MRI scan will probably not change the ultimate need for arthroscopic evaluation.  A cortisone shot has less than 50% chance of helping him and based on these fax the patient would like to proceed with  arthroscopic evaluation treatment of his knee the risks and benefits of surgery discussed at length.  I will see him back at the time of surgical intervention.  PHILLIPS, ERIC R 04/01/2014, 1:46 PM

## 2014-04-02 ENCOUNTER — Encounter (HOSPITAL_BASED_OUTPATIENT_CLINIC_OR_DEPARTMENT_OTHER)
Admission: RE | Admit: 2014-04-02 | Discharge: 2014-04-02 | Disposition: A | Discharge status: Home / Self Care | Payer: Medicare HMO | Source: Ambulatory Visit | Attending: Orthopedic Surgery | Admitting: Orthopedic Surgery

## 2014-04-02 DIAGNOSIS — IMO0002 Reserved for concepts with insufficient information to code with codable children: Secondary | ICD-10-CM | POA: Diagnosis present

## 2014-04-02 DIAGNOSIS — M2240 Chondromalacia patellae, unspecified knee: Secondary | ICD-10-CM | POA: Diagnosis not present

## 2014-04-02 DIAGNOSIS — E785 Hyperlipidemia, unspecified: Secondary | ICD-10-CM | POA: Diagnosis not present

## 2014-04-02 DIAGNOSIS — Z888 Allergy status to other drugs, medicaments and biological substances status: Secondary | ICD-10-CM | POA: Diagnosis not present

## 2014-04-02 DIAGNOSIS — M66269 Spontaneous rupture of extensor tendons, unspecified lower leg: Secondary | ICD-10-CM | POA: Diagnosis not present

## 2014-04-02 DIAGNOSIS — I10 Essential (primary) hypertension: Secondary | ICD-10-CM | POA: Diagnosis not present

## 2014-04-02 DIAGNOSIS — X58XXXA Exposure to other specified factors, initial encounter: Secondary | ICD-10-CM | POA: Diagnosis not present

## 2014-04-02 DIAGNOSIS — D696 Thrombocytopenia, unspecified: Secondary | ICD-10-CM | POA: Diagnosis not present

## 2014-04-02 DIAGNOSIS — I4891 Unspecified atrial fibrillation: Secondary | ICD-10-CM | POA: Diagnosis not present

## 2014-04-02 DIAGNOSIS — M1990 Unspecified osteoarthritis, unspecified site: Secondary | ICD-10-CM | POA: Diagnosis not present

## 2014-04-02 DIAGNOSIS — Z87891 Personal history of nicotine dependence: Secondary | ICD-10-CM | POA: Diagnosis not present

## 2014-04-02 DIAGNOSIS — S83289A Other tear of lateral meniscus, current injury, unspecified knee, initial encounter: Secondary | ICD-10-CM | POA: Diagnosis not present

## 2014-04-02 DIAGNOSIS — I739 Peripheral vascular disease, unspecified: Secondary | ICD-10-CM | POA: Diagnosis not present

## 2014-04-02 LAB — BASIC METABOLIC PANEL
Anion gap: 9 (ref 5–15)
BUN: 26 mg/dL — AB (ref 6–23)
CHLORIDE: 107 meq/L (ref 96–112)
CO2: 30 mEq/L (ref 19–32)
Calcium: 8.9 mg/dL (ref 8.4–10.5)
Creatinine, Ser: 1.41 mg/dL — ABNORMAL HIGH (ref 0.50–1.35)
GFR calc Af Amer: 53 mL/min — ABNORMAL LOW (ref >90)
GFR, EST NON AFRICAN AMERICAN: 46 mL/min — AB (ref >90)
Glucose, Bld: 81 mg/dL (ref 70–99)
Potassium: 4.7 mEq/L (ref 3.7–5.3)
Sodium: 146 mEq/L (ref 137–147)

## 2014-04-05 ENCOUNTER — Encounter (HOSPITAL_BASED_OUTPATIENT_CLINIC_OR_DEPARTMENT_OTHER)
Admission: RE | Disposition: A | Discharge status: Home / Self Care | Payer: Self-pay | Source: Ambulatory Visit | Attending: Orthopedic Surgery

## 2014-04-05 ENCOUNTER — Ambulatory Visit (HOSPITAL_BASED_OUTPATIENT_CLINIC_OR_DEPARTMENT_OTHER): Payer: Medicare HMO | Admitting: Certified Registered"

## 2014-04-05 ENCOUNTER — Encounter (HOSPITAL_BASED_OUTPATIENT_CLINIC_OR_DEPARTMENT_OTHER): Payer: Self-pay | Admitting: Anesthesiology

## 2014-04-05 ENCOUNTER — Encounter (HOSPITAL_BASED_OUTPATIENT_CLINIC_OR_DEPARTMENT_OTHER): Payer: Medicare HMO | Admitting: Certified Registered"

## 2014-04-05 ENCOUNTER — Ambulatory Visit (HOSPITAL_BASED_OUTPATIENT_CLINIC_OR_DEPARTMENT_OTHER)
Admission: RE | Admit: 2014-04-05 | Discharge: 2014-04-05 | Disposition: A | Discharge status: Home / Self Care | Payer: Medicare HMO | Source: Ambulatory Visit | Attending: Orthopedic Surgery | Admitting: Orthopedic Surgery

## 2014-04-05 DIAGNOSIS — M66269 Spontaneous rupture of extensor tendons, unspecified lower leg: Secondary | ICD-10-CM | POA: Insufficient documentation

## 2014-04-05 DIAGNOSIS — Z87891 Personal history of nicotine dependence: Secondary | ICD-10-CM | POA: Insufficient documentation

## 2014-04-05 DIAGNOSIS — I4891 Unspecified atrial fibrillation: Secondary | ICD-10-CM | POA: Insufficient documentation

## 2014-04-05 DIAGNOSIS — D696 Thrombocytopenia, unspecified: Secondary | ICD-10-CM | POA: Insufficient documentation

## 2014-04-05 DIAGNOSIS — Z888 Allergy status to other drugs, medicaments and biological substances status: Secondary | ICD-10-CM | POA: Insufficient documentation

## 2014-04-05 DIAGNOSIS — M2240 Chondromalacia patellae, unspecified knee: Secondary | ICD-10-CM | POA: Diagnosis not present

## 2014-04-05 DIAGNOSIS — M1990 Unspecified osteoarthritis, unspecified site: Secondary | ICD-10-CM | POA: Insufficient documentation

## 2014-04-05 DIAGNOSIS — S83207A Unspecified tear of unspecified meniscus, current injury, left knee, initial encounter: Secondary | ICD-10-CM

## 2014-04-05 DIAGNOSIS — I739 Peripheral vascular disease, unspecified: Secondary | ICD-10-CM | POA: Insufficient documentation

## 2014-04-05 DIAGNOSIS — I10 Essential (primary) hypertension: Secondary | ICD-10-CM | POA: Insufficient documentation

## 2014-04-05 DIAGNOSIS — E785 Hyperlipidemia, unspecified: Secondary | ICD-10-CM | POA: Insufficient documentation

## 2014-04-05 DIAGNOSIS — S83289A Other tear of lateral meniscus, current injury, unspecified knee, initial encounter: Secondary | ICD-10-CM | POA: Insufficient documentation

## 2014-04-05 DIAGNOSIS — X58XXXA Exposure to other specified factors, initial encounter: Secondary | ICD-10-CM | POA: Insufficient documentation

## 2014-04-05 HISTORY — PX: KNEE ARTHROSCOPY: SHX127

## 2014-04-05 HISTORY — DX: Cardiac arrhythmia, unspecified: I49.9

## 2014-04-05 SURGERY — ARTHROSCOPY, KNEE
Anesthesia: General | Site: Knee | Laterality: Left

## 2014-04-05 MED ORDER — SODIUM CHLORIDE 0.9 % IR SOLN
Status: DC | PRN
  Administered 2014-04-05 (×2): 3000 mL

## 2014-04-05 MED ORDER — ONDANSETRON HCL 4 MG/2ML IJ SOLN
4.0000 mg | Freq: Four times a day (QID) | INTRAMUSCULAR | Status: DC | PRN
Start: 2014-04-05 — End: 2014-04-05

## 2014-04-05 MED ORDER — METOCLOPRAMIDE HCL 5 MG/ML IJ SOLN: 5.0000 mg | Freq: Three times a day (TID) | INTRAMUSCULAR | Status: DC | PRN

## 2014-04-05 MED ORDER — LIDOCAINE HCL (CARDIAC) 20 MG/ML IV SOLN
INTRAVENOUS | Status: DC | PRN
Start: 2014-04-05 — End: 2014-04-05
  Administered 2014-04-05: 50 mg via INTRAVENOUS

## 2014-04-05 MED ORDER — FENTANYL CITRATE 0.05 MG/ML IJ SOLN
INTRAMUSCULAR | Status: DC | PRN
  Administered 2014-04-05: 50 ug via INTRAVENOUS

## 2014-04-05 MED ORDER — BUPIVACAINE-EPINEPHRINE 0.5% -1:200000 IJ SOLN
INTRAMUSCULAR | Status: DC | PRN
  Administered 2014-04-05: 20 mL

## 2014-04-05 MED ORDER — OXYCODONE HCL 5 MG/5ML PO SOLN: 5.0000 mg | Freq: Once | ORAL | Status: DC | PRN

## 2014-04-05 MED ORDER — EPINEPHRINE HCL 1 MG/ML IJ SOLN
INTRAMUSCULAR | Status: DC | PRN
  Administered 2014-04-05: 1 mg

## 2014-04-05 MED ORDER — OXYCODONE HCL 5 MG PO TABS: 5.0000 mg | ORAL_TABLET | Freq: Once | ORAL | Status: DC | PRN

## 2014-04-05 MED ORDER — DEXTROSE-NACL 5-0.45 % IV SOLN: INTRAVENOUS | Status: DC

## 2014-04-05 MED ORDER — METOCLOPRAMIDE HCL 5 MG PO TABS: 5.0000 mg | ORAL_TABLET | Freq: Three times a day (TID) | ORAL | Status: DC | PRN

## 2014-04-05 MED ORDER — CHLORHEXIDINE GLUCONATE 4 % EX LIQD: 60.0000 mL | Freq: Once | CUTANEOUS | Status: DC

## 2014-04-05 MED ORDER — MIDAZOLAM HCL 2 MG/2ML IJ SOLN: 1.0000 mg | INTRAMUSCULAR | Status: DC | PRN

## 2014-04-05 MED ORDER — ONDANSETRON HCL 4 MG/2ML IJ SOLN
INTRAMUSCULAR | Status: DC | PRN
  Administered 2014-04-05: 4 mg via INTRAVENOUS

## 2014-04-05 MED ORDER — HYDROMORPHONE HCL PF 1 MG/ML IJ SOLN: 0.2500 mg | INTRAMUSCULAR | Status: DC | PRN

## 2014-04-05 MED ORDER — ONDANSETRON HCL 4 MG PO TABS: 4.0000 mg | ORAL_TABLET | Freq: Four times a day (QID) | ORAL | Status: DC | PRN

## 2014-04-05 MED ORDER — CEFAZOLIN SODIUM-DEXTROSE 2-3 GM-% IV SOLR
2.0000 g | INTRAVENOUS | Status: AC
  Administered 2014-04-05: 10 g via INTRAVENOUS

## 2014-04-05 MED ORDER — CEFAZOLIN SODIUM-DEXTROSE 2-3 GM-% IV SOLR
INTRAVENOUS | Status: AC
  Filled 2014-04-05: qty 50

## 2014-04-05 MED ORDER — BUPIVACAINE-EPINEPHRINE (PF) 0.5% -1:200000 IJ SOLN
INTRAMUSCULAR | Status: AC
  Filled 2014-04-05: qty 30

## 2014-04-05 MED ORDER — FENTANYL CITRATE 0.05 MG/ML IJ SOLN
INTRAMUSCULAR | Status: AC
  Filled 2014-04-05: qty 4

## 2014-04-05 MED ORDER — FENTANYL CITRATE 0.05 MG/ML IJ SOLN: 50.0000 ug | INTRAMUSCULAR | Status: DC | PRN

## 2014-04-05 MED ORDER — ONDANSETRON HCL 4 MG/2ML IJ SOLN: 4.0000 mg | Freq: Once | INTRAMUSCULAR | Status: DC | PRN

## 2014-04-05 MED ORDER — DEXAMETHASONE SODIUM PHOSPHATE 4 MG/ML IJ SOLN
INTRAMUSCULAR | Status: DC | PRN
  Administered 2014-04-05: 4 mg via INTRAVENOUS

## 2014-04-05 MED ORDER — LACTATED RINGERS IV SOLN
INTRAVENOUS | Status: DC
  Administered 2014-04-05: 13:00:00 via INTRAVENOUS

## 2014-04-05 MED ORDER — BUPIVACAINE HCL (PF) 0.5 % IJ SOLN
INTRAMUSCULAR | Status: AC
  Filled 2014-04-05: qty 30

## 2014-04-05 MED ORDER — PROPOFOL 10 MG/ML IV BOLUS
INTRAVENOUS | Status: DC | PRN
  Administered 2014-04-05: 200 mg via INTRAVENOUS

## 2014-04-05 MED ORDER — HYDROCODONE-ACETAMINOPHEN 5-325 MG PO TABS: 1.0000 | ORAL_TABLET | Freq: Four times a day (QID) | ORAL | Status: DC | PRN

## 2014-04-05 MED ORDER — EPINEPHRINE HCL 1 MG/ML IJ SOLN
INTRAMUSCULAR | Status: AC
  Filled 2014-04-05: qty 1

## 2014-04-05 SURGICAL SUPPLY — 45 items
BANDAGE ELASTIC 6 VELCRO ST LF (GAUZE/BANDAGES/DRESSINGS) ×3 IMPLANT
BLADE 4.2CUDA (BLADE) IMPLANT
BLADE CUTTER GATOR 3.5 (BLADE) ×3 IMPLANT
BLADE GREAT WHITE 4.2 (BLADE) IMPLANT
BLADE GREAT WHITE 4.2MM (BLADE)
BNDG COHESIVE 6X5 TAN STRL LF (GAUZE/BANDAGES/DRESSINGS) ×3 IMPLANT
CANISTER SUCT 3000ML (MISCELLANEOUS) IMPLANT
DRAPE ARTHROSCOPY W/POUCH 114 (DRAPES) ×3 IMPLANT
DURAPREP 26ML APPLICATOR (WOUND CARE) ×3 IMPLANT
ELECT MENISCUS 165MM 90D (ELECTRODE) IMPLANT
ELECT REM PT RETURN 9FT ADLT (ELECTROSURGICAL)
ELECTRODE REM PT RTRN 9FT ADLT (ELECTROSURGICAL) IMPLANT
GAUZE SPONGE 4X4 12PLY STRL (GAUZE/BANDAGES/DRESSINGS) ×3 IMPLANT
GAUZE XEROFORM 1X8 LF (GAUZE/BANDAGES/DRESSINGS) ×3 IMPLANT
GLOVE BIO SURGEON STRL SZ 6.5 (GLOVE) ×2 IMPLANT
GLOVE BIO SURGEON STRL SZ7.5 (GLOVE) ×3 IMPLANT
GLOVE BIO SURGEON STRL SZ8.5 (GLOVE) ×3 IMPLANT
GLOVE BIO SURGEONS STRL SZ 6.5 (GLOVE) ×1
GLOVE BIOGEL PI IND STRL 7.0 (GLOVE) ×1 IMPLANT
GLOVE BIOGEL PI IND STRL 8 (GLOVE) ×1 IMPLANT
GLOVE BIOGEL PI IND STRL 9 (GLOVE) ×1 IMPLANT
GLOVE BIOGEL PI INDICATOR 7.0 (GLOVE) ×2
GLOVE BIOGEL PI INDICATOR 8 (GLOVE) ×2
GLOVE BIOGEL PI INDICATOR 9 (GLOVE) ×2
GOWN STRL REUS W/ TWL LRG LVL3 (GOWN DISPOSABLE) ×1 IMPLANT
GOWN STRL REUS W/ TWL XL LVL3 (GOWN DISPOSABLE) ×2 IMPLANT
GOWN STRL REUS W/TWL LRG LVL3 (GOWN DISPOSABLE) ×2
GOWN STRL REUS W/TWL XL LVL3 (GOWN DISPOSABLE) ×4
IV NS IRRIG 3000ML ARTHROMATIC (IV SOLUTION) ×6 IMPLANT
KNEE WRAP E Z 3 GEL PACK (MISCELLANEOUS) ×3 IMPLANT
MANIFOLD NEPTUNE II (INSTRUMENTS) ×3 IMPLANT
NDL SAFETY ECLIPSE 18X1.5 (NEEDLE) ×1 IMPLANT
NEEDLE HYPO 18GX1.5 SHARP (NEEDLE) ×2
PACK ARTHROSCOPY DSU (CUSTOM PROCEDURE TRAY) ×3 IMPLANT
PACK BASIN DAY SURGERY FS (CUSTOM PROCEDURE TRAY) ×3 IMPLANT
PAD ALCOHOL SWAB (MISCELLANEOUS) ×3 IMPLANT
PENCIL BUTTON HOLSTER BLD 10FT (ELECTRODE) IMPLANT
SET ARTHROSCOPY TUBING (MISCELLANEOUS) ×2
SET ARTHROSCOPY TUBING LN (MISCELLANEOUS) ×1 IMPLANT
SLEEVE SCD COMPRESS KNEE MED (MISCELLANEOUS) IMPLANT
SYR 3ML 18GX1 1/2 (SYRINGE) IMPLANT
SYR 5ML LL (SYRINGE) ×3 IMPLANT
TOWEL OR 17X24 6PK STRL BLUE (TOWEL DISPOSABLE) ×3 IMPLANT
WAND STAR VAC 90 (SURGICAL WAND) IMPLANT
WATER STERILE IRR 1000ML POUR (IV SOLUTION) ×3 IMPLANT

## 2014-04-05 NOTE — Anesthesia Postprocedure Evaluation (Signed)
  Anesthesia Post-op Note  Patient: [name]  Procedure(s) Performed: Procedure(s): LEFT ARTHROSCOPY KNEE, PARTIAL LATERAL MENISECTOMY, DEBRIDEMENT CHONROMALCIA (Left)  Patient Location: PACU  Anesthesia Type: General   Level of Consciousness: awake, alert  and oriented  Airway and Oxygen Therapy: Patient Spontanous Breathing  Post-op Pain: none  Post-op Assessment: Post-op Vital signs reviewed  Post-op Vital Signs: Reviewed  Last Vitals:  Filed Vitals:   04/05/14 1440  BP: 162/72  Pulse: 55  Temp:   Resp: 17    Complications: No apparent anesthesia complications

## 2014-04-05 NOTE — Interval H&P Note (Signed)
History and Physical Interval Note:  04/05/2014 12:35 PM  [name]  has presented today for surgery, with the diagnosis of LEFT KNEE MEDIAL MENISCAL TEAR,LOOSE BODY AND CHONCROMALACIA  The various methods of treatment have been discussed with the patient and family. After consideration of risks, benefits and other options for treatment, the patient has consented to  Procedure(s): LEFT ARTHROSCOPY KNEE (Left) as a surgical intervention .  The patient's history has been reviewed, patient examined, no change in status, stable for surgery.  I have reviewed the patient's chart and labs.  Questions were answered to the patient's satisfaction.     Nestor Lewandowsky

## 2014-04-05 NOTE — Anesthesia Preprocedure Evaluation (Signed)
Anesthesia Evaluation  Patient identified by MRN, date of birth, ID band Patient awake    Reviewed: Allergy & Precautions, H&P , NPO status , Patient's Chart, lab work & pertinent test results  Airway Mallampati: I TM Distance: >3 FB Neck ROM: Full    Dental  (+) Poor Dentition, Dental Advisory Given   Pulmonary former smoker,  breath sounds clear to auscultation        Cardiovascular hypertension, Pt. on medications Rhythm:Regular Rate:Normal     Neuro/Psych    GI/Hepatic   Endo/Other    Renal/GU      Musculoskeletal   Abdominal   Peds  Hematology   Anesthesia Other Findings   Reproductive/Obstetrics                           Anesthesia Physical Anesthesia Plan  ASA: III  Anesthesia Plan: General   Post-op Pain Management:    Induction: Intravenous  Airway Management Planned: LMA  Additional Equipment:   Intra-op Plan:   Post-operative Plan: Extubation in OR  Informed Consent: I have reviewed the patients History and Physical, chart, labs and discussed the procedure including the risks, benefits and alternatives for the proposed anesthesia with the patient or authorized representative who has indicated his/her understanding and acceptance.   Dental advisory given  Plan Discussed with: CRNA, Anesthesiologist and Surgeon  Anesthesia Plan Comments:         Anesthesia Quick Evaluation

## 2014-04-05 NOTE — Anesthesia Procedure Notes (Signed)
Procedure Name: LMA Insertion Date/Time: 04/05/2014 1:06 PM Performed by: Burna Cash Pre-anesthesia Checklist: Patient identified, Emergency Drugs available, Suction available and Patient being monitored Patient Re-evaluated:Patient Re-evaluated prior to inductionOxygen Delivery Method: Circle System Utilized Preoxygenation: Pre-oxygenation with 100% oxygen Intubation Type: IV induction Ventilation: Mask ventilation without difficulty LMA: LMA inserted LMA Size: 5.0 Number of attempts: 1 Airway Equipment and Method: bite block Placement Confirmation: positive ETCO2 Tube secured with: Tape Dental Injury: Teeth and Oropharynx as per pre-operative assessment

## 2014-04-05 NOTE — Transfer of Care (Signed)
Immediate Anesthesia Transfer of Care Note  Patient: [name]  Procedure(s) Performed: Procedure(s): LEFT ARTHROSCOPY KNEE, PARTIAL LATERAL MENISECTOMY, DEBRIDEMENT CHONROMALCIA (Left)  Patient Location: PACU  Anesthesia Type:General  Level of Consciousness: sedated  Airway & Oxygen Therapy: Patient Spontanous Breathing and Patient connected to face mask oxygen  Post-op Assessment: Report given to PACU RN and Post -op Vital signs reviewed and stable  Post vital signs: Reviewed and stable  Complications: No apparent anesthesia complications

## 2014-04-05 NOTE — Discharge Instructions (Addendum)
Arthroscopic Procedure, Knee °An arthroscopic procedure can find what is wrong with your knee. °PROCEDURE °Arthroscopy is a surgical technique that allows your orthopedic surgeon to diagnose and treat your knee injury with accuracy. They will look into your knee through a small instrument. This is almost like a small (pencil sized) telescope. Because arthroscopy affects your knee less than open knee surgery, you can anticipate a more rapid recovery. Taking an active role by following your caregiver's instructions will help with rapid and complete recovery. Use crutches, rest, elevation, ice, and knee exercises as instructed. The length of recovery depends on various factors including type of injury, age, physical condition, medical conditions, and your rehabilitation. °Your knee is the joint between the large bones (femur and tibia) in your leg. Cartilage covers these bone ends which are smooth and slippery and allow your knee to bend and move smoothly. Two menisci, thick, semi-lunar shaped pads of cartilage which form a rim inside the joint, help absorb shock and stabilize your knee. Ligaments bind the bones together and support your knee joint. Muscles move the joint, help support your knee, and take stress off the joint itself. Because of this all programs and physical therapy to rehabilitate an injured or repaired knee require rebuilding and strengthening your muscles. °AFTER THE PROCEDURE °· After the procedure, you will be moved to a recovery area until most of the effects of the medication have worn off. Your caregiver will discuss the test results with you. °· Only take over-the-counter or prescription medicines for pain, discomfort, or fever as directed by your caregiver. °SEEK MEDICAL CARE IF:  °· You have increased bleeding from your wounds. °· You see redness, swelling, or have increasing pain in your wounds. °· You have pus coming from your wound. °· You have an oral temperature above 102° F (38.9°  C). °· You notice a bad smell coming from the wound or dressing. °· You have severe pain with any motion of your knee. °SEEK IMMEDIATE MEDICAL CARE IF:  °· You develop a rash. °· You have difficulty breathing. °· You have any allergic problems. °Document Released: 07/06/2000 Document Revised: 10/01/2011 Document Reviewed: 01/28/2008 °[employer]® Patient Information ©2015 [employer]. This information is not intended to replace advice given to you by your health care provider. Make sure you discuss any questions you have with your health care provider. ° °Post Anesthesia Home Care Instructions ° °Activity: °Get plenty of rest for the remainder of the day. A responsible adult should stay with you for 24 hours following the procedure.  °For the next 24 hours, DO NOT: °-Drive a car °-Operate machinery °-Drink alcoholic beverages °-Take any medication unless instructed by your physician °-Make any legal decisions or sign important papers. ° °Meals: °Start with liquid foods such as gelatin or soup. Progress to regular foods as tolerated. Avoid greasy, spicy, heavy foods. If nausea and/or vomiting occur, drink only clear liquids until the nausea and/or vomiting subsides. Call your physician if vomiting continues. ° °Special Instructions/Symptoms: °Your throat may feel dry or sore from the anesthesia or the breathing tube placed in your throat during surgery. If this causes discomfort, gargle with warm salt water. The discomfort should disappear within 24 hours. ° °

## 2014-04-05 NOTE — Op Note (Signed)
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 Turner Daniels M.D.  Assist: Tomi Likens. Gaylene Brooks  (present throughout entire procedure and necessary for timely completion of the procedure) Anes: General LMA  EBL: Minimal  Fluids: 800 cc   Indications: Catching popping and pain in the left knee and a 80 year old man.. Pt has failed conservative treatment with anti-inflammatory medicines, physical therapy, and modified activites but did get good temporarily from an intra-articular cortisone injection. Pain has recurred and patient desires elective arthroscopic evaluation and treatment of knee. Risks and benefits of surgery have been discussed and questions answered.  Procedure: Patient identified by arm band and taken to the operating room at the day surgery Center. The appropriate anesthetic monitors were attached, and General LMA anesthesia was induced without difficulty. Lateral post was applied to the table and the lower extremity was prepped and draped in usual sterile fashion from the ankle to the midthigh. Time out procedure was performed. We began the operation by making standard inferior lateral and inferior medial peripatellar portals with a #11 blade allowing introduction of the arthroscope through the inferior lateral portal and the out flow to the inferior medial portal. Pump pressure was set at 100 mmHg and diagnostic arthroscopy  revealed grade 3 and focal grade 4 chondromalacia medial facet of the patella with flap tears that was debrided back to a stable margin with a 3.5 mm Gator  sucker shaver using the standard portals and a supplemental superior medial portal. Moving into the medial compartment grade 3 chondromalacia flap tears was noted of the medial femoral condyle debrided with a 3.5 mm Gator sucker shaver. The meniscus was intact. The anterior cruciate ligament and PCL were intact. Moving into the lateral compartment anterior horn lateral meniscal tear complex was noted and debrided back to a stable margin with a 3.5 mm Gator sucker shaver small upbiter and straight biter.. The knee was irrigated out normal saline solution. A dressing of xerofoam 4 x 4 dressing sponges, web roll and an Ace wrap was applied. The patient was awakened extubated and taken to the recovery without difficulty.    Signed: Nestor Lewandowsky, MD

## 2014-04-06 ENCOUNTER — Encounter (HOSPITAL_BASED_OUTPATIENT_CLINIC_OR_DEPARTMENT_OTHER): Payer: Self-pay | Admitting: Orthopedic Surgery

## 2014-04-14 ENCOUNTER — Encounter (HOSPITAL_BASED_OUTPATIENT_CLINIC_OR_DEPARTMENT_OTHER): Payer: Self-pay | Admitting: Orthopedic Surgery

## 2014-04-16 ENCOUNTER — Telehealth: Payer: Self-pay | Admitting: Neurology

## 2014-04-16 ENCOUNTER — Ambulatory Visit: Payer: Medicare HMO | Admitting: Neurology

## 2014-04-16 NOTE — Telephone Encounter (Signed)
Pt no showed today's NP appt w/ Dr. Karel Jarvis.   Alcario Drought - please send no show letter to pt and call Dr. Johnathan Hausen office / [facility].

## 2014-05-16 ENCOUNTER — Emergency Department (HOSPITAL_COMMUNITY): Payer: Medicare HMO

## 2014-05-16 ENCOUNTER — Emergency Department (EMERGENCY_DEPARTMENT_HOSPITAL)
Admission: EM | Admit: 2014-05-16 | Discharge: 2014-05-25 | Disposition: A | Discharge status: Home / Self Care | Payer: Medicare HMO | Source: Home / Self Care | Attending: Emergency Medicine | Admitting: Emergency Medicine

## 2014-05-16 ENCOUNTER — Encounter (HOSPITAL_COMMUNITY): Payer: Self-pay | Admitting: Emergency Medicine

## 2014-05-16 DIAGNOSIS — R4182 Altered mental status, unspecified: Secondary | ICD-10-CM | POA: Diagnosis not present

## 2014-05-16 DIAGNOSIS — F4324 Adjustment disorder with disturbance of conduct: Secondary | ICD-10-CM

## 2014-05-16 DIAGNOSIS — Z862 Personal history of diseases of the blood and blood-forming organs and certain disorders involving the immune mechanism: Secondary | ICD-10-CM | POA: Insufficient documentation

## 2014-05-16 DIAGNOSIS — Z87891 Personal history of nicotine dependence: Secondary | ICD-10-CM

## 2014-05-16 DIAGNOSIS — F03918 Unspecified dementia, unspecified severity, with other behavioral disturbance: Secondary | ICD-10-CM | POA: Diagnosis present

## 2014-05-16 DIAGNOSIS — R451 Restlessness and agitation: Secondary | ICD-10-CM

## 2014-05-16 DIAGNOSIS — W19XXXA Unspecified fall, initial encounter: Secondary | ICD-10-CM | POA: Insufficient documentation

## 2014-05-16 DIAGNOSIS — Z791 Long term (current) use of non-steroidal anti-inflammatories (NSAID): Secondary | ICD-10-CM | POA: Insufficient documentation

## 2014-05-16 DIAGNOSIS — Y9289 Other specified places as the place of occurrence of the external cause: Secondary | ICD-10-CM | POA: Insufficient documentation

## 2014-05-16 DIAGNOSIS — I4891 Unspecified atrial fibrillation: Secondary | ICD-10-CM

## 2014-05-16 DIAGNOSIS — M1990 Unspecified osteoarthritis, unspecified site: Secondary | ICD-10-CM

## 2014-05-16 DIAGNOSIS — A419 Sepsis, unspecified organism: Secondary | ICD-10-CM | POA: Diagnosis not present

## 2014-05-16 DIAGNOSIS — I10 Essential (primary) hypertension: Secondary | ICD-10-CM

## 2014-05-16 DIAGNOSIS — Z973 Presence of spectacles and contact lenses: Secondary | ICD-10-CM | POA: Insufficient documentation

## 2014-05-16 DIAGNOSIS — Y9389 Activity, other specified: Secondary | ICD-10-CM | POA: Insufficient documentation

## 2014-05-16 DIAGNOSIS — Z79899 Other long term (current) drug therapy: Secondary | ICD-10-CM | POA: Insufficient documentation

## 2014-05-16 DIAGNOSIS — E785 Hyperlipidemia, unspecified: Secondary | ICD-10-CM | POA: Insufficient documentation

## 2014-05-16 DIAGNOSIS — F0391 Unspecified dementia with behavioral disturbance: Secondary | ICD-10-CM

## 2014-05-16 LAB — CBC WITH DIFFERENTIAL/PLATELET
Basophils Absolute: 0 10*3/uL (ref 0.0–0.1)
Basophils Relative: 0 % (ref 0–1)
EOS ABS: 0 10*3/uL (ref 0.0–0.7)
Eosinophils Relative: 0 % (ref 0–5)
HEMATOCRIT: 36.5 % — AB (ref 39.0–52.0)
Hemoglobin: 12.5 g/dL — ABNORMAL LOW (ref 13.0–17.0)
LYMPHS ABS: 0.5 10*3/uL — AB (ref 0.7–4.0)
LYMPHS PCT: 7 % — AB (ref 12–46)
MCH: 33.2 pg (ref 26.0–34.0)
MCHC: 34.2 g/dL (ref 30.0–36.0)
MCV: 96.8 fL (ref 78.0–100.0)
MONO ABS: 1.2 10*3/uL — AB (ref 0.1–1.0)
Monocytes Relative: 16 % — ABNORMAL HIGH (ref 3–12)
NEUTROS ABS: 5.7 10*3/uL (ref 1.7–7.7)
Neutrophils Relative %: 77 % (ref 43–77)
Platelets: 113 10*3/uL — ABNORMAL LOW (ref 150–400)
RBC: 3.77 MIL/uL — ABNORMAL LOW (ref 4.22–5.81)
RDW: 13.4 % (ref 11.5–15.5)
WBC: 7.4 10*3/uL (ref 4.0–10.5)

## 2014-05-16 LAB — URINALYSIS, ROUTINE W REFLEX MICROSCOPIC
GLUCOSE, UA: NEGATIVE mg/dL
Hgb urine dipstick: NEGATIVE
KETONES UR: NEGATIVE mg/dL
LEUKOCYTES UA: NEGATIVE
Nitrite: NEGATIVE
Protein, ur: NEGATIVE mg/dL
Specific Gravity, Urine: 1.02 (ref 1.005–1.030)
Urobilinogen, UA: 1 mg/dL (ref 0.0–1.0)
pH: 5 (ref 5.0–8.0)

## 2014-05-16 LAB — BASIC METABOLIC PANEL
Anion gap: 14 (ref 5–15)
BUN: 41 mg/dL — AB (ref 6–23)
CO2: 26 mEq/L (ref 19–32)
CREATININE: 2.1 mg/dL — AB (ref 0.50–1.35)
Calcium: 9.5 mg/dL (ref 8.4–10.5)
Chloride: 102 mEq/L (ref 96–112)
GFR calc Af Amer: 33 mL/min — ABNORMAL LOW (ref >90)
GFR, EST NON AFRICAN AMERICAN: 28 mL/min — AB (ref >90)
Glucose, Bld: 97 mg/dL (ref 70–99)
POTASSIUM: 4.4 meq/L (ref 3.7–5.3)
Sodium: 142 mEq/L (ref 137–147)

## 2014-05-16 MED ORDER — ACETAMINOPHEN 325 MG PO TABS: 650.0000 mg | ORAL_TABLET | ORAL | Status: DC | PRN

## 2014-05-16 MED ORDER — ZOLPIDEM TARTRATE 5 MG PO TABS: 5.0000 mg | ORAL_TABLET | Freq: Every evening | ORAL | Status: DC | PRN

## 2014-05-16 MED ORDER — ALUM & MAG HYDROXIDE-SIMETH 200-200-20 MG/5ML PO SUSP: 30.0000 mL | ORAL | Status: DC | PRN

## 2014-05-16 MED ORDER — LORAZEPAM 1 MG PO TABS
1.0000 mg | ORAL_TABLET | Freq: Three times a day (TID) | ORAL | Status: DC | PRN
  Administered 2014-05-17 – 2014-05-24 (×8): 1 mg via ORAL
  Filled 2014-05-16 (×8): qty 1

## 2014-05-16 MED ORDER — NICOTINE 21 MG/24HR TD PT24
21.0000 mg | MEDICATED_PATCH | Freq: Every day | TRANSDERMAL | Status: DC
  Administered 2014-05-18: 21 mg via TRANSDERMAL
  Filled 2014-05-16 (×3): qty 1

## 2014-05-16 MED ORDER — ONDANSETRON HCL 4 MG PO TABS: 4.0000 mg | ORAL_TABLET | Freq: Three times a day (TID) | ORAL | Status: DC | PRN

## 2014-05-16 NOTE — ED Notes (Signed)
Bed: WA11 Expected date:  Expected time:  Means of arrival:  Comments: EMS 
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
## 2014-05-16 NOTE — ED Notes (Signed)
Attempted to obtain clean catch specimen. Pt unable to void at this time.

## 2014-05-16 NOTE — ED Notes (Signed)
Pt unable to void after placing urinal for approx 30 min. Will request In and out cath specimen. Noted foul odor urine.
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
## 2014-05-16 NOTE — BH Assessment (Signed)
Spoke with Junious SilkHannah Merrell, PA-C who reported that pt was sent over from his nursing facility requesting a psychiatric consult. Dahlia ClientHannah reported that a behavioral health assessment was not needed at this time and pt will benefit from having a psychiatric consult. Pt will be evaluated by psychiatry in the morning.

## 2014-05-17 ENCOUNTER — Encounter (HOSPITAL_COMMUNITY): Payer: Self-pay | Admitting: Psychiatry

## 2014-05-17 DIAGNOSIS — F03918 Unspecified dementia, unspecified severity, with other behavioral disturbance: Secondary | ICD-10-CM | POA: Diagnosis present

## 2014-05-17 DIAGNOSIS — R451 Restlessness and agitation: Secondary | ICD-10-CM

## 2014-05-17 DIAGNOSIS — F0391 Unspecified dementia with behavioral disturbance: Secondary | ICD-10-CM | POA: Diagnosis present

## 2014-05-17 MED ORDER — AMIODARONE HCL 200 MG PO TABS
200.0000 mg | ORAL_TABLET | Freq: Every day | ORAL | Status: DC
  Administered 2014-05-17 – 2014-05-25 (×9): 200 mg via ORAL
  Filled 2014-05-17 (×10): qty 1

## 2014-05-17 MED ORDER — DIVALPROEX SODIUM 125 MG PO DR TAB
125.0000 mg | DELAYED_RELEASE_TABLET | Freq: Two times a day (BID) | ORAL | Status: DC
  Administered 2014-05-17 – 2014-05-24 (×13): 125 mg via ORAL
  Filled 2014-05-17 (×20): qty 1

## 2014-05-17 MED ORDER — HALOPERIDOL LACTATE 5 MG/ML IJ SOLN
2.5000 mg | Freq: Once | INTRAMUSCULAR | Status: AC
  Administered 2014-05-17: 5 mg via INTRAVENOUS

## 2014-05-17 MED ORDER — OXYBUTYNIN CHLORIDE ER 5 MG PO TB24
5.0000 mg | ORAL_TABLET | Freq: Every day | ORAL | Status: DC
  Administered 2014-05-17 – 2014-05-25 (×8): 5 mg via ORAL
  Filled 2014-05-17 (×10): qty 1

## 2014-05-17 MED ORDER — OXYCODONE HCL 5 MG PO TABS
5.0000 mg | ORAL_TABLET | ORAL | Status: DC | PRN
  Administered 2014-05-18: 5 mg via ORAL
  Administered 2014-05-19: 10 mg via ORAL
  Administered 2014-05-21 – 2014-05-23 (×3): 5 mg via ORAL
  Administered 2014-05-23: 10 mg via ORAL
  Administered 2014-05-24: 5 mg via ORAL
  Filled 2014-05-17 (×2): qty 1
  Filled 2014-05-17: qty 2
  Filled 2014-05-17: qty 1
  Filled 2014-05-17: qty 2
  Filled 2014-05-17 (×2): qty 1

## 2014-05-17 MED ORDER — LOSARTAN POTASSIUM 50 MG PO TABS
50.0000 mg | ORAL_TABLET | Freq: Every day | ORAL | Status: DC
  Administered 2014-05-17 – 2014-05-25 (×9): 50 mg via ORAL
  Filled 2014-05-17 (×10): qty 1

## 2014-05-17 MED ORDER — ATORVASTATIN CALCIUM 20 MG PO TABS
20.0000 mg | ORAL_TABLET | Freq: Every day | ORAL | Status: DC
  Administered 2014-05-17 – 2014-05-25 (×9): 20 mg via ORAL
  Filled 2014-05-17 (×10): qty 1

## 2014-05-17 MED ORDER — MELOXICAM 15 MG PO TABS
15.0000 mg | ORAL_TABLET | Freq: Every day | ORAL | Status: DC
  Administered 2014-05-17 – 2014-05-18 (×2): 15 mg via ORAL
  Filled 2014-05-17 (×3): qty 1

## 2014-05-17 MED ORDER — HALOPERIDOL LACTATE 5 MG/ML IJ SOLN
INTRAMUSCULAR | Status: AC
  Administered 2014-05-17: 5 mg via INTRAVENOUS
  Filled 2014-05-17: qty 1

## 2014-05-17 MED ORDER — FUROSEMIDE 20 MG PO TABS
20.0000 mg | ORAL_TABLET | Freq: Every day | ORAL | Status: DC
  Administered 2014-05-17 – 2014-05-25 (×9): 20 mg via ORAL
  Filled 2014-05-17 (×5): qty 1
  Filled 2014-05-17: qty 0.5
  Filled 2014-05-17 (×5): qty 1

## 2014-05-17 MED ORDER — HYDROXYZINE HCL 10 MG PO TABS
10.0000 mg | ORAL_TABLET | Freq: Once | ORAL | Status: AC
  Administered 2014-05-17: 10 mg via ORAL
  Filled 2014-05-17: qty 1
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
## 2014-05-17 NOTE — ED Notes (Addendum)
Pt's family came and notify this writer that upon discharge pt will not be returning to [facility], but to [facility]. They also stated that certain forms should be faxed to [facility]. Social work made aware.

## 2014-05-17 NOTE — Progress Notes (Signed)
Per nurse, patient wife states that she does not want patient to return to [facility] and wants patient to be discharged to [facility]. Patient pending psych re-evaluation in the morning after medications started. Patient wife aware of the plan to be reassessed. Patient clinical information to be sent to [facility] for review in the morning. Patient wife aware that if bed is not available at [facility] when patient is psychiatrically  stable patient to be discharged back to [facility].   Byrd HesselbachKristen Gopal Malter, LCSW [phone]  ED CSW 05/17/2014 1523pm

## 2014-05-17 NOTE — ED Notes (Signed)
Spoke with ms Francesco RunnerHolden at [facility] about [facility]'s transfer from [facility] and she sts pt hasn't been accepted to their facility as of yet; she sts they need FL2, physicians note,  H&P and therapist evaluation to be faxed to [phone]. They will then discuss pt's possible admission to their facility.
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
## 2014-05-18 NOTE — ED Notes (Signed)
Bed: WA29 Expected date:  Expected time:  Means of arrival:  Comments: Tr 2 

## 2014-05-18 NOTE — ED Notes (Signed)
Wife came to visit pt, pt still sleeping, unable to give medications or feed pt breakfast at this time

## 2014-05-18 NOTE — BH Assessment (Signed)
[facility] Assessment Progress Note  At 17:55 Delorise ShinerGrace at [facility] reports that pt has been declined at their facility due to pt acuity.  I also called [facility] at 18:17 to follow up on a previous referral.  Phone rolled to voice mail and I left a message.  Doylene Canninghomas Evi Mccomb, MA Triage Specialist 05/18/2014 @ 18:32
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
## 2014-05-18 NOTE — ED Notes (Signed)
Patient grimacing while being turned and appears to be in pain. PRN pain medication given as ordered. Pt also attempting to get out of bed and is yelling out and agitated. Anxiety meds given PRN.

## 2014-05-19 LAB — BASIC METABOLIC PANEL
Anion gap: 9 (ref 5–15)
BUN: 31 mg/dL — ABNORMAL HIGH (ref 6–23)
CO2: 30 meq/L (ref 19–32)
Calcium: 9.4 mg/dL (ref 8.4–10.5)
Chloride: 103 mEq/L (ref 96–112)
Creatinine, Ser: 1.37 mg/dL — ABNORMAL HIGH (ref 0.50–1.35)
GFR calc non Af Amer: 47 mL/min — ABNORMAL LOW (ref >90)
GFR, EST AFRICAN AMERICAN: 55 mL/min — AB (ref >90)
Glucose, Bld: 89 mg/dL (ref 70–99)
POTASSIUM: 3.9 meq/L (ref 3.7–5.3)
Sodium: 142 mEq/L (ref 137–147)

## 2014-05-19 MED ORDER — CITALOPRAM HYDROBROMIDE 10 MG PO TABS: 10.0000 mg | ORAL_TABLET | Freq: Every day | ORAL | Status: DC

## 2014-05-19 MED ORDER — HALOPERIDOL 1 MG PO TABS
1.0000 mg | ORAL_TABLET | Freq: Four times a day (QID) | ORAL | Status: DC | PRN
  Administered 2014-05-19 (×2): 1 mg via ORAL
  Filled 2014-05-19 (×2): qty 1

## 2014-05-19 NOTE — ED Notes (Signed)
Sitter said she will get his vital when the patient wakes up 
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
## 2014-05-19 NOTE — ED Provider Notes (Signed)
Medical screening examination/treatment/procedure(s) were conducted as a shared visit with non-physician practitioner(s) and myself.  I personally evaluated the patient during the encounter.   EKG Interpretation None       [facility] Chart review shows he was this way over past 1-2 weeks after [facility] that [facility]. Likely being in a stepdown unit and going to new facilities (hospital and now rehab) are contributing. Will consult psych.  Audree CamelScott T Damyia Strider, MD 05/19/14 [phone]
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
## 2014-05-19 NOTE — Progress Notes (Signed)
Pt recommended for inpatient geri psych. Pt family updated.   Byrd HesselbachKristen Charnita Trudel, LCSW [phone]  ED CSW 05/18/2014 1200pm
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
## 2014-05-20 LAB — VALPROIC ACID LEVEL: VALPROIC ACID LVL: 26 ug/mL — AB (ref 50.0–100.0)

## 2014-05-20 MED ORDER — QUETIAPINE FUMARATE 25 MG PO TABS
25.0000 mg | ORAL_TABLET | Freq: Two times a day (BID) | ORAL | Status: DC
  Administered 2014-05-20 – 2014-05-25 (×10): 25 mg via ORAL
  Filled 2014-05-20 (×10): qty 1
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
## 2014-05-20 NOTE — Progress Notes (Signed)
[facility].  CSW obtained Authorization# [id number].    Byrd HesselbachKristen Dandre Sisler, LCSW [phone]  ED CSW 05/20/2014 1422pm

## 2014-05-20 NOTE — Progress Notes (Signed)
Pt continues to meet inpt criteria for treatment. SW followed up with facilities:  [facility]. [facility] of area [facility]  Will continue to pursue placement.  Derrell Lollingoris Drue Harr, MSW  Social Worker [phone]

## 2014-05-20 NOTE — Progress Notes (Signed)
Pt has been assessed and meets inpatient criteria for psychiatric treatment. Pt.'s clinicals faxed out to:  [facility]  Will continue to pursue placement.  Colton Lollingoris [name], MSW  Social Worker [phone]

## 2014-05-20 NOTE — BH Assessment (Addendum)
[facility] requested verbal referral information. Provided requested demographics. Pt is being reviewed by admissions nurse. [facility] to call TTS back if pt is considered for acceptance. Authorization number will be needed.    Inpt treatment recommended. [facility] as [facility].    Clista BernhardtNancy Caya Soberanis, [facility] Triage Specialist 05/20/2014 11:26 PM

## 2014-05-20 NOTE — ED Notes (Signed)
Pt resting in bed with eyes closed.

## 2014-05-20 NOTE — Consult Note (Signed)
  [name] remains the same.  No reported issues with diet and he cannot say one way or the other.  He is sleeping this morning and his wife says let him sleep as he continues to take his clothes off and to climb out of his bed.  He is not easily redirected.  Will change the Haldol to Seroquel 25 mg bid to see if that helps better.  Celexa discontinued  to drug- drug interaction.

## 2014-05-20 NOTE — BH Assessment (Signed)
[facility] Assessment Progress Note  At 17:19 I received a call from Snyderindy at [facility].  Pt has been declined for admission to their facility by Dr Mylo RedBrodski due to lack of a discharge plan, once pt is stabilized.  Doylene Canninghomas Jamille Fisher, MA Triage Specialist 05/20/2014 @ 17:40

## 2014-05-21 DIAGNOSIS — R451 Restlessness and agitation: Secondary | ICD-10-CM | POA: Diagnosis not present

## 2014-05-21 LAB — VALPROIC ACID LEVEL: VALPROIC ACID LVL: 25.7 ug/mL — AB (ref 50.0–100.0)

## 2014-05-21 NOTE — Progress Notes (Signed)
Depakote level received and [facility].   Derrell Lollingoris Rajinder Mesick, MSW  Social Worker [phone]

## 2014-05-21 NOTE — ED Notes (Signed)
Called EDP Dr Micheline Mazeocherty and Dr Romeo AppleHarrison to report vitals. Dr. Romeo AppleHarrison to evaluate. Pt is resting in his room. Will continue to monitor. Safety maintained.

## 2014-05-21 NOTE — Progress Notes (Addendum)
SW called [facility] to confirm information regarding referral was received. Information received and [facility] requesting a Depakote level to be drawn and faxed to [phone]. Information shared with ED CSW to follow.  [name], MSW  Social Worker [phone]
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
## 2014-05-21 NOTE — ED Notes (Signed)
Pt made moaning sounds when moved to change his diaper. Gave prn pain medication. Gave medication with apple sauce and gave pt water to drink. Pt has a small tear on his rt buttocks. Cleaned and placed skin protectant. Reported to NP. Safety maintained in the TCU.
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
## 2014-05-21 NOTE — ED Notes (Signed)
Dr. Romeo AppleHarrison in to assess patient. No new orders received.

## 2014-05-21 NOTE — Consult Note (Signed)
[facility] Consult   Reason for Consult:  Agitation Referring Physician:  EDP  ZACHARIUS FUNARI is an 80 y.o. male. Total Time spent with patient: 20 minutes  Assessment: AXIS I:  Agitation AXIS II:  Deferred AXIS III:   Past Medical History  Diagnosis Date  . Hypertension   . Anemia   . Thrombocytopenia   . A-fib   . Peripheral vascular disease   . Fatigue   . Osteoarthritis   . Hyperlipidemia   . Dysrhythmia     hx af   AXIS IV:  other psychosocial or environmental problems and problems related to social environment AXIS V:  moderate  Plan:  Start Depakote 125 mg BID for mood stability and re-evaluate in the am for discharge back to his facility.  Subjective:   KAMIR SELOVER is a 80 y.o. male patient does not warrant admission.  HPI:   The patient on assessment was non-verbal, agitation at times. HPI Elements:   Location:  generalized. Quality:  acute. Severity:  moderate. Timing:  intermittent. Duration:  few weeks. Context:  stressors.  Past Psychiatric History: Past Medical History  Diagnosis Date  . Hypertension   . Anemia   . Thrombocytopenia   . A-fib   . Peripheral vascular disease   . Fatigue   . Osteoarthritis   . Hyperlipidemia   . Dysrhythmia     hx af    reports that he quit smoking about 45 years ago. He has never used smokeless tobacco. He reports that he does not drink alcohol or use illicit drugs. Family History  Problem Relation Age of Onset  . Alzheimer's disease Mother 95  . Cancer Father 86    prostate cancer  . Cancer Brother     unknown cancer           Allergies:   Allergies  Allergen Reactions  . Lisinopril     Patient states it causes runny nose, itchiness, and allergy-type symptoms.    ACT Assessment Complete:  Yes:    Educational Status    Risk to Self: Risk to self with the past 6 months Is patient at risk for suicide?: No Substance abuse history and/or treatment for substance abuse?: No   Risk to Others:    Abuse:    Prior Inpatient Therapy:    Prior Outpatient Therapy:    Additional Information:                    Objective: Blood pressure 104/58, pulse 56, temperature 97.4 F (36.3 C), temperature source Axillary, resp. rate 16, SpO2 100.00%.There is no weight on file to calculate BMI. Results for orders placed during the hospital encounter of 05/16/14 (from the past 72 hour(s))  BASIC METABOLIC PANEL     Status: Abnormal   Collection Time    05/19/14  2:45 AM      Result Value Ref Range   Sodium 142  137 - 147 mEq/L   Potassium 3.9  3.7 - 5.3 mEq/L   Chloride 103  96 - 112 mEq/L   CO2 30  19 - 32 mEq/L   Glucose, Bld 89  70 - 99 mg/dL   BUN 31 (*) 6 - 23 mg/dL   Creatinine, Ser 1.37 (*) 0.50 - 1.35 mg/dL   Calcium 9.4  8.4 - 10.5 mg/dL   GFR calc non Af Amer 47 (*) >90 mL/min   GFR calc Af Amer 55 (*) >90 mL/min   Comment: (NOTE)  The eGFR has been calculated using the CKD EPI equation.     This calculation has not been validated in all clinical situations.     eGFR's persistently <90 mL/min signify possible Chronic Kidney     Disease.   Anion gap 9  5 - 15  VALPROIC ACID LEVEL     Status: Abnormal   Collection Time    05/20/14  8:59 PM      Result Value Ref Range   Valproic Acid Lvl 26.0 (*) 50.0 - 100.0 ug/mL   Comment: Performed at [facility]  VALPROIC ACID LEVEL     Status: Abnormal   Collection Time    05/21/14 11:42 AM      Result Value Ref Range   Valproic Acid Lvl 25.7 (*) 50.0 - 100.0 ug/mL   Comment: Performed at [facility] are reviewed and are pertinent for no medical issues.  Current Facility-Administered Medications  Medication Dose Route Frequency Provider Last Rate Last Dose  . acetaminophen (TYLENOL) tablet 650 mg  650 mg Oral Q4H PRN Elwyn Lade, PA-C      . alum & mag hydroxide-simeth (MAALOX/MYLANTA) 200-200-20 MG/5ML suspension 30 mL  30 mL Oral PRN Elwyn Lade, PA-C      .  amiodarone (PACERONE) tablet 200 mg  200 mg Oral Daily Waylan Boga, NP   200 mg at 05/21/14 1132  . atorvastatin (LIPITOR) tablet 20 mg  20 mg Oral Daily Waylan Boga, NP   20 mg at 05/21/14 1129  . divalproex (DEPAKOTE) DR tablet 125 mg  125 mg Oral Q12H Waylan Boga, NP   125 mg at 05/21/14 0737  . furosemide (LASIX) tablet 20 mg  20 mg Oral Daily Waylan Boga, NP   20 mg at 05/21/14 1131  . LORazepam (ATIVAN) tablet 1 mg  1 mg Oral Q8H PRN Elwyn Lade, PA-C   1 mg at 05/19/14 1610  . losartan (COZAAR) tablet 50 mg  50 mg Oral Daily Waylan Boga, NP   50 mg at 05/21/14 1128  . nicotine (NICODERM CQ - dosed in mg/24 hours) patch 21 mg  21 mg Transdermal Daily Elwyn Lade, PA-C   21 mg at 05/18/14 1158  . ondansetron (ZOFRAN) tablet 4 mg  4 mg Oral Q8H PRN Elwyn Lade, PA-C      . oxybutynin (DITROPAN-XL) 24 hr tablet 5 mg  5 mg Oral Daily Waylan Boga, NP   5 mg at 05/21/14 1129  . oxyCODONE (Oxy IR/ROXICODONE) immediate release tablet 5-15 mg  5-15 mg Oral Q4H PRN Waylan Boga, NP   5 mg at 05/21/14 1601  . QUEtiapine (SEROQUEL) tablet 25 mg  25 mg Oral BID Shuvon Rankin, NP   25 mg at 05/21/14 1130   Current Outpatient Prescriptions  Medication Sig Dispense Refill  . amiodarone (PACERONE) 200 MG tablet Take 200 mg by mouth daily.        Marland Kitchen atorvastatin (LIPITOR) 20 MG tablet Take 1 tablet (20 mg total) by mouth daily.  30 tablet  6  . furosemide (LASIX) 20 MG tablet Take 20 mg by mouth daily.      . haloperidol lactate (HALDOL) 5 MG/ML injection Inject 2 mg into the muscle every 6 (six) hours as needed (for agitation related to dementia).      Marland Kitchen losartan (COZAAR) 50 MG tablet Take 50 mg by mouth daily.      . meloxicam (MOBIC) 15 MG tablet Take 15 mg  by mouth daily.      Marland Kitchen oxybutynin (DITROPAN-XL) 5 MG 24 hr tablet Take 5 mg by mouth daily.      Marland Kitchen oxycodone (OXY-IR) 5 MG capsule Take 5-15 mg by mouth every 4 (four) hours as needed for pain. Give 35m for moderate pain, give 15m for severe pain      . [DISCONTINUED] lisinopril (PRINIVIL,ZESTRIL) 20 MG tablet Take 20 mg by mouth daily.          Psychiatric Specialty Exam:     Blood pressure 104/58, pulse 56, temperature 97.4 F (36.3 C), temperature source Axillary, resp. rate 16, SpO2 100.00%.There is no weight on file to calculate BMI.   General Appearance: Casual  Eye Contact::  Minimal  Speech:  did not speak  Volume:  Non-verbal on assessment  Mood:  Non-verbal on assessment  Affect:  Blunt  Thought Process:  Non-verbal on assessment  Orientation:  Other:  self  Thought Content:  Non-verbal on assessment  Suicidal Thoughts:  Non-verbal on assessment  Homicidal Thoughts: Non-verbal on assessment  Memory:  Non-verbal on assessment  Judgement:  Impaired  Insight:  Lacking  Psychomotor Activity:  Normal  Concentration:  Poor  Recall:  Non-verbal on assessment  Fund of Knowledge:Non-verbal on assessment  Language: Non-verbal on assessment  Akathisia:  No  Handed:  Non-verbal on assessment  AIMS (if indicated):     Assets:  Financial Resources/Insurance Housing Resilience Social Support  Sleep:      Musculoskeletal: Strength & Muscle Tone: decreased Gait & Station: unsteady Patient leans: Right  Treatment Plan Summary Daily contact with patient to assess and evaluate symptoms and progress in treatment Medication management; admit to inpatient gero-psychiatry for stabilization  LOWaylan BogaPMH-NP 05/21/2014 5:33 PM  Patient seen, evaluated and I agree with notes by Nurse Practitioner. MoCorena PilgrimMD
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
## 2014-05-21 NOTE — ED Notes (Signed)
Pt has been resting in his room much of the morning. Pt's spouse came to visit. Pt opens his eyes when spoken to. He appears confused and does not verbally answer questions. Pt takes his medication with apple sauce and prompting. He has been calm on the unit this morning. Safety maintained.

## 2014-05-22 DIAGNOSIS — R451 Restlessness and agitation: Secondary | ICD-10-CM | POA: Diagnosis not present

## 2014-05-22 DIAGNOSIS — F0391 Unspecified dementia with behavioral disturbance: Secondary | ICD-10-CM | POA: Diagnosis not present

## 2014-05-22 LAB — VALPROIC ACID LEVEL: Valproic Acid Lvl: 15.7 ug/mL — ABNORMAL LOW (ref 50.0–100.0)

## 2014-05-22 MED ORDER — ENSURE COMPLETE PO LIQD
237.0000 mL | Freq: Two times a day (BID) | ORAL | Status: DC
  Administered 2014-05-22 – 2014-05-25 (×4): 237 mL via ORAL
  Filled 2014-05-22 (×7): qty 237

## 2014-05-22 MED ORDER — HALOPERIDOL LACTATE 5 MG/ML IJ SOLN
2.5000 mg | Freq: Once | INTRAMUSCULAR | Status: AC
  Administered 2014-05-22: 2.5 mg via INTRAMUSCULAR
  Filled 2014-05-22: qty 1

## 2014-05-22 MED ORDER — LORAZEPAM 2 MG/ML IJ SOLN
1.0000 mg | Freq: Once | INTRAMUSCULAR | Status: AC
  Administered 2014-05-22: 1 mg via INTRAMUSCULAR
  Filled 2014-05-22: qty 1
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
## 2014-05-22 NOTE — [facility] Suicide Risk Assessment (Signed)
Suicide Risk Assessment  Discharge Assessment     Demographic Factors:  Male and Age 78 or older  Total Time spent with patient: 30 minutes  Psychiatric Specialty Exam:     Blood pressure 111/61, pulse 67, temperature 97.8 F (36.6 C), temperature source Axillary, resp. rate 16, SpO2 97.00%.There is no weight on file to calculate BMI.  General Appearance: Disheveled  Eye Contact::  Minimal  Speech:  Garbled  Volume:  Decreased  Mood:  Euthymic  Affect:  Congruent  Thought Process:  forwards very little  Orientation:  Other:  oriented to self  Thought Content:  little verbal communication  Suicidal Thoughts:  No  Homicidal Thoughts:  No  Memory:  Immediate;   Poor Recent;   Poor Remote;   Poor  Judgement:  Impaired  Insight:  Lacking  Psychomotor Activity:  Decreased  Concentration:  Poor  Recall:  Poor  Fund of Knowledge: forwards little   Language: minimial   Akathisia:  No  Handed:  Right  AIMS (if indicated):     Assets:  Health and safety inspectorinancial Resources/Insurance Housing Leisure Time Resilience Social Support  Sleep:       Musculoskeletal: Strength & Muscle Tone: within normal limits Gait & Station: normal Patient leans: N/A   Mental Status Per Nursing Assessment::   On Admission:   agitation  Current Mental Status by Physician: NA  Loss Factors: NA  Historical Factors: NA  Risk Reduction Factors:   Living with another person, especially a relative and Positive social support  Continued Clinical Symptoms:  None  Cognitive Features That Contribute To Risk:  Dementia  Suicide Risk:  Minimal: No identifiable suicidal ideation.  Patients presenting with no risk factors but with morbid ruminations; may be classified as minimal risk based on the severity of the depressive symptoms  Discharge Diagnoses:   AXIS I:  dementia with behavior disturbances AXIS II:  Deferred AXIS III:   Past Medical History  Diagnosis Date  . Hypertension   . Anemia   .  Thrombocytopenia   . A-fib   . Peripheral vascular disease   . Fatigue   . Osteoarthritis   . Hyperlipidemia   . Dysrhythmia     hx af   AXIS IV:  housing problems, other psychosocial or environmental problems and problems related to social environment AXIS V:  61-70 mild symptoms  Plan Of Care/Follow-up recommendations:  Activity:  as tolerated Diet:  heart healthy  Is patient on multiple antipsychotic therapies at discharge:  No   Has Patient had three or more failed trials of antipsychotic monotherapy by history:  No  Recommended Plan for Multiple Antipsychotic Therapies: NA    Rowen Hur, PMH-NP 05/22/2014, 1:46 PM

## 2014-05-22 NOTE — ED Notes (Signed)
Patient resting with eyes closed; no s/s of distress noted at this time.

## 2014-05-22 NOTE — Consult Note (Signed)
[facility] Face-to-Face Psychiatry Consult   Reason for Consult:  Agitation Referring Physician:  EDP  [name] is an 80 y.o. male. Total Time spent with patient: 20 minutes  Assessment: AXIS I:  Agitation, dementia AXIS II:  Deferred AXIS III:   Past Medical History  Diagnosis Date  . Hypertension   . Anemia   . Thrombocytopenia   . A-fib   . Peripheral vascular disease   . Fatigue   . Osteoarthritis   . Hyperlipidemia   . Dysrhythmia     hx af   AXIS IV:  other psychosocial or environmental problems and problems related to social environment AXIS V:  Mild symptoms, 70  Plan:  Discharge back to his skilled nursing facility.   Subjective:   [name] is a 80 y.o. male patient does not warrant admission.  HPI:   The patient on assessment forwards little information but has not been agitated in over 24 hours.   HPI Elements:   Location:  generalized. Quality:  acute. Severity:  moderate. Timing:  intermittent. Duration:  few weeks. Context:  stressors.  Past Psychiatric History: Past Medical History  Diagnosis Date  . Hypertension   . Anemia   . Thrombocytopenia   . A-fib   . Peripheral vascular disease   . Fatigue   . Osteoarthritis   . Hyperlipidemia   . Dysrhythmia     hx af    reports that he quit smoking about 45 years ago. He has never used smokeless tobacco. He reports that he does not drink alcohol or use illicit drugs. Family History  Problem Relation Age of Onset  . Alzheimer's disease Mother 48  . Cancer Father 41    prostate cancer  . Cancer Brother     unknown cancer           Allergies:   Allergies  Allergen Reactions  . Lisinopril     Patient states it causes runny nose, itchiness, and allergy-type symptoms.    ACT Assessment Complete:  Yes:    Educational Status    Risk to Self: Risk to self with the past 6 months Is patient at risk for suicide?: No Substance abuse history and/or treatment for substance abuse?: No   Risk to Others:    Abuse:    Prior Inpatient Therapy:    Prior Outpatient Therapy:    Additional Information:                    Objective: Blood pressure 111/61, pulse 67, temperature 97.8 F (36.6 C), temperature source Axillary, resp. rate 16, SpO2 97.00%.There is no weight on file to calculate BMI. Results for orders placed during the hospital encounter of 05/16/14 (from the past 72 hour(s))  VALPROIC ACID LEVEL     Status: Abnormal   Collection Time    05/20/14  8:59 PM      Result Value Ref Range   Valproic Acid Lvl 26.0 (*) 50.0 - 100.0 ug/mL   Comment: Performed at [facility]  VALPROIC ACID LEVEL     Status: Abnormal   Collection Time    05/21/14 11:42 AM      Result Value Ref Range   Valproic Acid Lvl 25.7 (*) 50.0 - 100.0 ug/mL   Comment: Performed at [facility]  VALPROIC ACID LEVEL     Status: Abnormal   Collection Time    05/22/14  9:53 AM      Result Value Ref Range  Valproic Acid Lvl 15.7 (*) 50.0 - 100.0 ug/mL   Comment: [facility]   Labs are reviewed and are pertinent for no medical issues.  Current Facility-Administered Medications  Medication Dose Route Frequency Provider Last Rate Last Dose  . acetaminophen (TYLENOL) tablet 650 mg  650 mg Oral Q4H PRN Mora BellmanHannah S Merrell, PA-C      . alum & mag hydroxide-simeth (MAALOX/MYLANTA) 200-200-20 MG/5ML suspension 30 mL  30 mL Oral PRN Mora BellmanHannah S Merrell, PA-C      . amiodarone (PACERONE) tablet 200 mg  200 mg Oral Daily Nanine MeansJamison Lord, NP   200 mg at 05/22/14 1009  . atorvastatin (LIPITOR) tablet 20 mg  20 mg Oral Daily Nanine MeansJamison Lord, NP   20 mg at 05/22/14 1009  . divalproex (DEPAKOTE) DR tablet 125 mg  125 mg Oral Q12H Nanine MeansJamison Lord, NP   125 mg at 05/22/14 1008  . feeding supplement (ENSURE COMPLETE) (ENSURE COMPLETE) liquid 237 mL  237 mL Oral BID BM Lyanne CoKevin M Campos, MD      . furosemide (LASIX) tablet 20 mg  20 mg Oral Daily Nanine MeansJamison Lord, NP   20 mg at 05/22/14  1009  . LORazepam (ATIVAN) tablet 1 mg  1 mg Oral Q8H PRN Mora BellmanHannah S Merrell, PA-C   1 mg at 05/22/14 1009  . losartan (COZAAR) tablet 50 mg  50 mg Oral Daily Nanine MeansJamison Lord, NP   50 mg at 05/22/14 1010  . nicotine (NICODERM CQ - dosed in mg/24 hours) patch 21 mg  21 mg Transdermal Daily Mora BellmanHannah S Merrell, PA-C   21 mg at 05/18/14 1158  . ondansetron (ZOFRAN) tablet 4 mg  4 mg Oral Q8H PRN Mora BellmanHannah S Merrell, PA-C      . oxybutynin (DITROPAN-XL) 24 hr tablet 5 mg  5 mg Oral Daily Nanine MeansJamison Lord, NP   5 mg at 05/21/14 1129  . oxyCODONE (Oxy IR/ROXICODONE) immediate release tablet 5-15 mg  5-15 mg Oral Q4H PRN Nanine MeansJamison Lord, NP   5 mg at 05/21/14 1601  . QUEtiapine (SEROQUEL) tablet 25 mg  25 mg Oral BID Shuvon Rankin, NP   25 mg at 05/22/14 1012   Current Outpatient Prescriptions  Medication Sig Dispense Refill  . amiodarone (PACERONE) 200 MG tablet Take 200 mg by mouth daily.        Marland Kitchen. atorvastatin (LIPITOR) 20 MG tablet Take 1 tablet (20 mg total) by mouth daily.  30 tablet  6  . furosemide (LASIX) 20 MG tablet Take 20 mg by mouth daily.      . haloperidol lactate (HALDOL) 5 MG/ML injection Inject 2 mg into the muscle every 6 (six) hours as needed (for agitation related to dementia).      Marland Kitchen. losartan (COZAAR) 50 MG tablet Take 50 mg by mouth daily.      . meloxicam (MOBIC) 15 MG tablet Take 15 mg by mouth daily.      Marland Kitchen. oxybutynin (DITROPAN-XL) 5 MG 24 hr tablet Take 5 mg by mouth daily.      Marland Kitchen. oxycodone (OXY-IR) 5 MG capsule Take 5-15 mg by mouth every 4 (four) hours as needed for pain. Give 5mg  for moderate pain, give 10mg  for severe pain      . [DISCONTINUED] lisinopril (PRINIVIL,ZESTRIL) 20 MG tablet Take 20 mg by mouth daily.         Psychiatric Specialty Exam:     Blood pressure 111/61, pulse 67, temperature 97.8 F (36.6 C), temperature source Axillary, resp. rate 16,  SpO2 97.00%.There is no weight on file to calculate BMI.  General Appearance: Disheveled  Eye Contact::  Minimal  Speech:   Garbled  Volume:  Decreased  Mood:  Euthymic  Affect:  Congruent  Thought Process:  forwards very little  Orientation:  Other:  oriented to self  Thought Content:  little verbal communication  Suicidal Thoughts:  No  Homicidal Thoughts:  No  Memory:  Immediate;   Poor Recent;   Poor Remote;   Poor  Judgement:  Impaired  Insight:  Lacking  Psychomotor Activity:  Decreased  Concentration:  Poor  Recall:  Poor  Fund of Knowledge: forwards little   Language: minimial   Akathisia:  No  Handed:  Right  AIMS (if indicated):     Assets:  Health and safety inspectorinancial Resources/Insurance Housing Leisure Time Resilience Social Support  Sleep:       Musculoskeletal: Strength & Muscle Tone: within normal limits Gait & Station: normal Patient leans: N/A  Treatment Plan Summary Discharge home to his Skilled Nursing Facility, stable at this time, no agitation  Nanine MeansLORD, JAMISON, PMH-NP 05/22/2014 1:54 PM   I have personally seen the patient and agreed with the findings and involved in the treatment plan. Kathryne SharperSyed Alazar Cherian, MD

## 2014-05-22 NOTE — ED Notes (Addendum)
Attempted to call report and Stacy, RN sts that they are are not taking pt back [facility] d/t his behavior. SW alex notified to call manger, Guave at [phone] to resolve issue

## 2014-05-22 NOTE — ED Notes (Signed)
Pt agitated, yelling out and fidgeting with covers and self.

## 2014-05-22 NOTE — ED Notes (Signed)
RN attempting to elevate heels off of bed, pt moving pillow repeatedly. Dr. Ethelda ChickJacubowitz notified, heel protectors ordered for patient. Pt turned for relief of pressure. Call placed to Wound care, no answer or returned call. MD aware.

## 2014-05-22 NOTE — ED Notes (Addendum)
During am rounds and brief change, pt noted to have a skin tear on left buttock from repositioning and cleaning. Dr. Ranae PalmsYelverton notified to assess skin.

## 2014-05-22 NOTE — ED Notes (Signed)
Pt more calm at this time, but does remain restless. No yelling or physical aggression at this time.

## 2014-05-22 NOTE — ED Notes (Addendum)
Dr. Ranae PalmsYelverton in to assess skin tear to left buttock. States will order wound care consult. Allevyn Life Sacrum dressing placed on buttocks and brief removed. Pt dry. Pillow placed under right side for relief of pressure. No distress noted.

## 2014-05-22 NOTE — ED Notes (Signed)
Patient with incontinent urine; new brief placed and patient turned. No distress noted.

## 2014-05-22 NOTE — ED Notes (Signed)
Patient restless and grimacing with turning. Pt reaching out for unseen things and responding to internal stimuli. Ativan given for anxiety and pain medication given for comfort. No s/s of distress noted.

## 2014-05-22 NOTE — ED Provider Notes (Signed)
4 cm diameter wound on the sacral area consistent with skin tear. No evidence of infection. Wound care consulted.   Loren Raceravid Keirsten Matuska, MD 05/22/14 [phone]

## 2014-05-22 NOTE — ED Notes (Signed)
Bilateral heel protectors placed on pt's feet for prevention of breakdown. Pt with incontinent urine, pt's linen changed, as well as skin care and new brief provided. Pt's sacral dressing clean, dry and intact. Dr. Ethelda ChickJacubowitz aware.

## 2014-05-22 NOTE — ED Notes (Signed)
Pyxis not working, pharmacy notified to send meds
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
## 2014-05-23 ENCOUNTER — Ambulatory Visit: Payer: Self-pay | Admitting: Internal Medicine

## 2014-05-23 NOTE — Progress Notes (Addendum)
10:00am. CSW left another message for [name], Nurse, children'sadminstrator at [facility].   3:43pm. CSW left message for [facility], state advocate.   3:46pm. CSW left message for [name], pt's spouse, to update on disposition status.   [name] LCSWA,     ED CSW  phone: [phone]

## 2014-05-23 NOTE — ED Notes (Signed)
Patient turned to his right side. Pillow placed under his side to relieve pressure. Pt continues to wear his bilateral heel protectors and has a pillow between his knees. No s/s of distress noted at this time.

## 2014-05-23 NOTE — Progress Notes (Signed)
LATE ENTRY  CSW received call from pt's daughter, Mrs. [place]prings. Mrs. Springs expressed anger and concern about disposition plan to [facility]. Mrs. Springs was aware that [facility], and she stated that this was another reason she did not want him to return, in addition the fact that he fell while he was there. Mrs. Springs also expressed frustration and confusion as to why we the disposition is no longer for geri-psych.   CSW attempted to explain the placement process from the hospital. Mrs. Springs stated she understood decision to no longer pursue geri-psych placement, but she was adamant that she did not want [facility]. CSW attempted to provide supportive counseling. CSW provided update on pt's status in hospital, and directed daughter's medical questions to nursing and medical staff.   Mariann LasterAlexandra Ieshia Hatcher LCSWA,     ED CSW  phone: [phone]

## 2014-05-23 NOTE — ED Notes (Signed)
Dressing to sacrum clean, dry and intact.

## 2014-05-23 NOTE — ED Notes (Signed)
Patient restless and reaching out in the air and grabbing for unseen things. Pt appears to be responding to internal stimuli. Pt also grimacing while being turned; pain medication given for comfort. Pt also fed two sides of food, which he took with his medications. No s/s of distress noted at this time. Patient repositioned to his right side, with pillow under his side and between his knees. Pt also continues to have heel protector boots on both feet.

## 2014-05-23 NOTE — Consult Note (Signed)
Psychiatry follow-up note   [name] is an 80 y.o. male. Total Time spent with patient: 20 minutes  Assessment: AXIS I:  Agitation, dementia AXIS II:  Deferred AXIS III:   Past Medical History  Diagnosis Date  . Hypertension   . Anemia   . Thrombocytopenia   . A-fib   . Peripheral vascular disease   . Fatigue   . Osteoarthritis   . Hyperlipidemia   . Dysrhythmia     hx af   AXIS IV:  other psychosocial or environmental problems and problems related to social environment  Plan:  Discharge back to his skilled nursing facility.   Subjective:   [name] is a 80 y.o. male patient does not warrant admission.  HPI:   Patient seen chart reviewed.  Patient is on his baseline.  There has been no recent agitation or any anger and past 24 hours.  Patient has severe dementia and he is unable to comprehend.  He requires assistance for his ADLs.  Patient requires disposition in a structured environment/ skilled nursing facility/nursing home.  Past Psychiatric History: Past Medical History  Diagnosis Date  . Hypertension   . Anemia   . Thrombocytopenia   . A-fib   . Peripheral vascular disease   . Fatigue   . Osteoarthritis   . Hyperlipidemia   . Dysrhythmia     hx af    reports that he quit smoking about 45 years ago. He has never used smokeless tobacco. He reports that he does not drink alcohol or use illicit drugs. Family History  Problem Relation Age of Onset  . Alzheimer's disease Mother [id number]  . Cancer Father [id number]    prostate cancer  . Cancer Brother     unknown cancer           Allergies:   Allergies  Allergen Reactions  . Lisinopril     Patient states it causes runny nose, itchiness, and allergy-type symptoms.    ACT Assessment Complete:  Yes:    Educational Status    Risk to Self: Risk to self with the past 6 months Is patient at risk for suicide?: No Substance abuse history and/or treatment for substance abuse?: No  Risk to Others:     Abuse:    Prior Inpatient Therapy:    Prior Outpatient Therapy:    Additional Information:                    Objective: Blood pressure 107/43, pulse 60, temperature 97.7 F (36.5 C), temperature source Oral, resp. rate 20, SpO2 100 %.There is no weight on file to calculate BMI. Results for orders placed or performed during the hospital encounter of 05/16/14 (from the past 72 hour(s))  Valproic acid level     Status: Abnormal   Collection Time: 05/20/14  8:59 PM  Result Value Ref Range   Valproic Acid Lvl 26.0 (L) 50.0 - 100.0 ug/mL    Comment: Performed [facility]  Valproic acid level     Status: Abnormal   Collection Time: 05/21/14 11:42 AM  Result Value Ref Range   Valproic Acid Lvl 25.7 (L) 50.0 - 100.0 ug/mL    Comment: Performed at [facility]  Valproic acid level     Status: Abnormal   Collection Time: 05/22/14  9:53 AM  Result Value Ref Range   Valproic Acid Lvl 15.7 (L) 50.0 - 100.0 ug/mL    Comment: Performed at [facility]  Labs are reviewed and are pertinent for no medical issues.  Current Facility-Administered Medications  Medication Dose Route Frequency Provider Last Rate Last Dose  . acetaminophen (TYLENOL) tablet 650 mg  650 mg Oral Q4H PRN Mora Bellman, PA-C      . alum & mag hydroxide-simeth (MAALOX/MYLANTA) 200-200-20 MG/5ML suspension 30 mL  30 mL Oral PRN Mora Bellman, PA-C      . amiodarone (PACERONE) tablet 200 mg  200 mg Oral Daily Nanine Means, NP   200 mg at 05/22/14 1009  . atorvastatin (LIPITOR) tablet 20 mg  20 mg Oral Daily Nanine Means, NP   20 mg at 05/22/14 1009  . divalproex (DEPAKOTE) DR tablet 125 mg  125 mg Oral Q12H Nanine Means, NP   125 mg at 05/22/14 2224  . feeding supplement (ENSURE COMPLETE) (ENSURE COMPLETE) liquid 237 mL  237 mL Oral BID BM Lyanne Co, MD   237 mL at 05/22/14 1450  . furosemide (LASIX) tablet 20 mg  20 mg Oral Daily Nanine Means, NP   20 mg at 05/22/14 1009  .  LORazepam (ATIVAN) tablet 1 mg  1 mg Oral Q8H PRN Mora Bellman, PA-C   1 mg at 05/22/14 2224  . losartan (COZAAR) tablet 50 mg  50 mg Oral Daily Nanine Means, NP   50 mg at 05/22/14 1010  . nicotine (NICODERM CQ - dosed in mg/24 hours) patch 21 mg  21 mg Transdermal Daily Mora Bellman, PA-C   21 mg at 05/18/14 1158  . ondansetron (ZOFRAN) tablet 4 mg  4 mg Oral Q8H PRN Mora Bellman, PA-C      . oxybutynin (DITROPAN-XL) 24 hr tablet 5 mg  5 mg Oral Daily Nanine Means, NP   5 mg at 05/21/14 1129  . oxyCODONE (Oxy IR/ROXICODONE) immediate release tablet 5-15 mg  5-15 mg Oral Q4H PRN Nanine Means, NP   5 mg at 05/22/14 2224  . QUEtiapine (SEROQUEL) tablet 25 mg  25 mg Oral BID Shuvon Rankin, NP   25 mg at 05/22/14 2224   Current Outpatient Prescriptions  Medication Sig Dispense Refill  . amiodarone (PACERONE) 200 MG tablet Take 200 mg by mouth daily.      Marland Kitchen atorvastatin (LIPITOR) 20 MG tablet Take 1 tablet (20 mg total) by mouth daily. 30 tablet 6  . furosemide (LASIX) 20 MG tablet Take 20 mg by mouth daily.    . haloperidol lactate (HALDOL) 5 MG/ML injection Inject 2 mg into the muscle every 6 (six) hours as needed (for agitation related to dementia).    Marland Kitchen losartan (COZAAR) 50 MG tablet Take 50 mg by mouth daily.    . meloxicam (MOBIC) 15 MG tablet Take 15 mg by mouth daily.    Marland Kitchen oxybutynin (DITROPAN-XL) 5 MG 24 hr tablet Take 5 mg by mouth daily.    Marland Kitchen oxycodone (OXY-IR) 5 MG capsule Take 5-15 mg by mouth every 4 (four) hours as needed for pain. Give 5mg  for moderate pain, give 10mg  for severe pain    . [DISCONTINUED] lisinopril (PRINIVIL,ZESTRIL) 20 MG tablet Take 20 mg by mouth daily.       Psychiatric Specialty Exam:     Blood pressure 111/61, pulse 67, temperature 97.8 F (36.6 C), temperature source Axillary, resp. rate 16, SpO2 97.00%.There is no weight on file to calculate BMI.  General Appearance: Disheveled  Eye Contact::  Minimal  Speech:  Garbled  Volume:  Decreased   Mood:  Euthymic  Affect:  Congruent  Thought Process:  forwards very little  Orientation:  Other:  oriented to self  Thought Content:  little verbal communication  Suicidal Thoughts:  No  Homicidal Thoughts:  No  Memory:  Immediate;   Poor Recent;   Poor Remote;   Poor  Judgement:  Impaired  Insight:  Lacking  Psychomotor Activity:  Decreased  Concentration:  Poor  Recall:  Poor  Fund of Knowledge: forwards little   Language: minimial   Akathisia:  No  Handed:  Right  AIMS (if indicated):     Assets:  Health and safety inspectorinancial Resources/Insurance Housing Leisure Time Resilience Social Support  Sleep:       Musculoskeletal: Strength & Muscle Tone: within normal limits Gait & Station: normal Patient leans: N/A  Treatment Plan Summary Discharge home to his Skilled Nursing Facility, no changes in his medication,  He is stable at this time.   ARFEEN,SYED T., 05/23/2014 10:17 AM

## 2014-05-24 DIAGNOSIS — R451 Restlessness and agitation: Secondary | ICD-10-CM | POA: Diagnosis not present

## 2014-05-24 DIAGNOSIS — F0391 Unspecified dementia with behavioral disturbance: Secondary | ICD-10-CM | POA: Diagnosis not present

## 2014-05-24 MED ORDER — VALPROIC ACID 250 MG/5ML PO SYRP
250.0000 mg | ORAL_SOLUTION | Freq: Two times a day (BID) | ORAL | Status: DC
  Administered 2014-05-24 – 2014-05-25 (×2): 250 mg via ORAL
  Filled 2014-05-24 (×5): qty 5
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
## 2014-05-24 NOTE — Progress Notes (Signed)
CSW met with pt wife and discuss disposition plan. Per psychiatry patient is psychiatrically stable for discharge to skilled nursing as pt medications have been adjusted and patient is no longer physically aggressive. Pt continues to reach for things that are not there, however per psychiatry patient not meeting inpatient criteria at this time.   Pt family do not wish for patient to return to [facility]. Patient family interested in [facility]. Patient was declined originally from [facility] due to behaviors. Pt to be reconsidered now that patient medications have been adjusted. Pt also to be seen by physical therapy for insurance authorization. CSW explained to patient wife, that if a bed is not available for pt at [facility] then patient will need to return to [facility] or another skilled nursing facility today.   Noreene Larsson [phone]  ED CSW 05/24/2014 1051am

## 2014-05-24 NOTE — Progress Notes (Signed)
05/24/14 1445 ED Cm consulted by ED SW at 1505 about possible need for home health pending a return call from spouse.  [facility] but he is unable to return to previous snf.  SW discussed possible Home health with spouse who stated she would call SW back.   ED Cm spoke with TCU RN to find out spouse not present in room #31 with pt and she had informed RN she would be "in and out" visiting pt today.  CM called spouse at [phone] Cm discussed that CM was calling about speaking with her about home health. She reports pt had home health previously and "it did not help any"  CM discussed [facility] RN disease management programs that are available.   Spouse asked "How does this work?" but before Cm could respond spouse informed CM she would have to call her back because she was "driving in the rain" Cm provided ED CM mobile for a return call

## 2014-05-24 NOTE — Progress Notes (Signed)
05/24/14 1737 ED CM updated ED SW about call to spouse.  ED CM spoke with Morton AmyJaqueline TCU RN to request CM and/or SW to be called each time spouse visits so that CM/SW can conference with her about disposition

## 2014-05-24 NOTE — ED Notes (Signed)
Patient reaching out into the air, grabbing for things not present. Pt talking to himself and appears to be responding to internal stimuli. No distress noted at this time.

## 2014-05-24 NOTE — Consult Note (Signed)
WOC wound consult note Reason for Consult: Stage II pressure ulcer to right gluteal area, present on admission.  Wound type: Stage II pressure ulcer Pressure Ulcer POA: Yes Measurement: 2 cm x 3 cm x 0.1 cm  Wound bed: 100% pink, moist nongranulating tissue Drainage (amount, consistency, odor) Minimal, serous drainage. Periwound: Intact Dressing procedure/placement/frequency: Cleanse pressure ulcer to right gluteal area with NS and pat gently dry.  Apply silicone bordered foam, Allevyn dressing.  Replace every 3 days and PRN soilage. Float bilateral heels to offload pressure due to altered mental status and increased risk of developing pressure ulcers.  Will not follow at this time.  Please re-consult if needed.  [place] HudsonKaren Chino Sardo RN BSN CWON Pager [phone]

## 2014-05-24 NOTE — ED Notes (Signed)
Pt resting in bed with eyes closed; no s/s of distress noted. Respirations regular and unlabored; skin warm and dry.

## 2014-05-24 NOTE — Progress Notes (Signed)
Rep[facility] to [facility] concerning patients spouse needing to speak with social worker/case management prior to discharge.

## 2014-05-24 NOTE — ED Notes (Signed)
Patient constantly taking off his gown and pulling at his brief. Pt attempting to hit and bite staff with redirection.

## 2014-05-24 NOTE — ED Notes (Signed)
Patient repositioned and brief changed. Pt placed on his right side. New Allevyn silicone dressing placed on sacrum. Wound remains at 4 cm in diameter. Scant amount of yellow slough on old dressing. No infection noted.

## 2014-05-24 NOTE — ED Notes (Addendum)
Patient turned to his right side. Allevyn dressing changed on sacrum. Skin tear to left buttocks appears to be healing. Wound blanching, red in appearance and measuring approximately 4cm in diameter. Skin care performed with cleansing spray, dried thoroughly. No evidence of infection noted on exam.

## 2014-05-24 NOTE — Progress Notes (Signed)
CSW spoke with pt wife and daughter regarding pt discharge. Pt declined from skilled nursing facilities [facility] was [facility] in [facility]. Pt not in network with [facility]. Pt family declined the bed offer of [facility]. Pt unable to [facility] and family do not wish for him to return. CSW and pt wife discussed that since there are not other options available at this time, and patient is medically and psychiatrically stable for discharge, patient would have the option  to be discharged home with home health services for care and include a social worker to continue to seek placement from home Patient wife stated she was going to work on something and call csw back. CSW informed RN case manager of potential home health needs.   Byrd HesselbachKristen Dezaria Methot, LCSW [phone]  ED CSW 05/24/2014 1505pm

## 2014-05-24 NOTE — Consult Note (Signed)
[facility] Face-to-Face Psychiatry Consult   Reason for Consult:  Agitation Referring Physician:  EDP  [name] is an 80 y.o. male. Total Time spent with patient: 20 minutes  Assessment: AXIS I:  Agitation, dementia AXIS II:  Deferred AXIS III:   Past Medical History  Diagnosis Date  . Hypertension   . Anemia   . Thrombocytopenia   . A-fib   . Peripheral vascular disease   . Fatigue   . Osteoarthritis   . Hyperlipidemia   . Dysrhythmia     hx af   AXIS IV:  other psychosocial or environmental problems and problems related to social environment AXIS V:  Mild symptoms, 72  Plan:  Discharge back to his skilled nursing facility.   Subjective:   [name] is a 80 y.o. male patient does not warrant admission.  HPI:   The patient on assessment forwards little information but has not been agitated in over 24 hours.   HPI Elements:   Location:  generalized. Quality:  acute. Severity:  moderate. Timing:  intermittent. Duration:  few weeks. Context:  stressors.  Past Psychiatric History: Past Medical History  Diagnosis Date  . Hypertension   . Anemia   . Thrombocytopenia   . A-fib   . Peripheral vascular disease   . Fatigue   . Osteoarthritis   . Hyperlipidemia   . Dysrhythmia     hx af    reports that he quit smoking about 45 years ago. He has never used smokeless tobacco. He reports that he does not drink alcohol or use illicit drugs. Family History  Problem Relation Age of Onset  . Alzheimer's disease Mother 65  . Cancer Father 30    prostate cancer  . Cancer Brother     unknown cancer           Allergies:   Allergies  Allergen Reactions  . Lisinopril     Patient states it causes runny nose, itchiness, and allergy-type symptoms.    ACT Assessment Complete:  Yes:    Educational Status    Risk to Self: Risk to self with the past 6 months Is patient at risk for suicide?: No Substance abuse history and/or treatment for substance abuse?: No   Risk to Others:    Abuse:    Prior Inpatient Therapy:    Prior Outpatient Therapy:    Additional Information:                    Objective: Blood pressure 102/44, pulse 79, temperature 97.9 F (36.6 C), temperature source Oral, resp. rate 18, SpO2 100 %.There is no weight on file to calculate BMI. Results for orders placed or performed during the hospital encounter of 05/16/14 (from the past 72 hour(s))  Valproic acid level     Status: Abnormal   Collection Time: 05/22/14  9:53 AM  Result Value Ref Range   Valproic Acid Lvl 15.7 (L) 50.0 - 100.0 ug/mL    Comment: Performed at [facility] are reviewed and are pertinent for no medical issues.  Current Facility-Administered Medications  Medication Dose Route Frequency Provider Last Rate Last Dose  . acetaminophen (TYLENOL) tablet 650 mg  650 mg Oral Q4H PRN Mora Bellman, PA-C      . alum & mag hydroxide-simeth (MAALOX/MYLANTA) 200-200-20 MG/5ML suspension 30 mL  30 mL Oral PRN Mora Bellman, PA-C      . amiodarone (PACERONE) tablet 200 mg  200  mg Oral Daily Nanine MeansJamison Lord, NP   200 mg at 05/24/14 0935  . atorvastatin (LIPITOR) tablet 20 mg  20 mg Oral Daily Nanine MeansJamison Lord, NP   20 mg at 05/24/14 0934  . feeding supplement (ENSURE COMPLETE) (ENSURE COMPLETE) liquid 237 mL  237 mL Oral BID BM Lyanne CoKevin M Campos, MD   237 mL at 05/24/14 0935  . furosemide (LASIX) tablet 20 mg  20 mg Oral Daily Nanine MeansJamison Lord, NP   20 mg at 05/24/14 0935  . LORazepam (ATIVAN) tablet 1 mg  1 mg Oral Q8H PRN Mora BellmanHannah S Merrell, PA-C   1 mg at 05/23/14 1017  . losartan (COZAAR) tablet 50 mg  50 mg Oral Daily Nanine MeansJamison Lord, NP   50 mg at 05/24/14 0935  . nicotine (NICODERM CQ - dosed in mg/24 hours) patch 21 mg  21 mg Transdermal Daily Mora BellmanHannah S Merrell, PA-C   21 mg at 05/18/14 1158  . ondansetron (ZOFRAN) tablet 4 mg  4 mg Oral Q8H PRN Mora BellmanHannah S Merrell, PA-C      . oxybutynin (DITROPAN-XL) 24 hr tablet 5 mg  5 mg Oral Daily Nanine MeansJamison Lord,  NP   5 mg at 05/24/14 0934  . oxyCODONE (Oxy IR/ROXICODONE) immediate release tablet 5-15 mg  5-15 mg Oral Q4H PRN Nanine MeansJamison Lord, NP   5 mg at 05/23/14 1947  . QUEtiapine (SEROQUEL) tablet 25 mg  25 mg Oral BID Shuvon Rankin, NP   25 mg at 05/24/14 0935  . Valproic Acid (DEPAKENE) 250 MG/5ML syrup SYRP 250 mg  250 mg Oral BID Nanine MeansJamison Lord, NP       Current Outpatient Prescriptions  Medication Sig Dispense Refill  . amiodarone (PACERONE) 200 MG tablet Take 200 mg by mouth daily.      Marland Kitchen. atorvastatin (LIPITOR) 20 MG tablet Take 1 tablet (20 mg total) by mouth daily. 30 tablet 6  . furosemide (LASIX) 20 MG tablet Take 20 mg by mouth daily.    . haloperidol lactate (HALDOL) 5 MG/ML injection Inject 2 mg into the muscle every 6 (six) hours as needed (for agitation related to dementia).    Marland Kitchen. losartan (COZAAR) 50 MG tablet Take 50 mg by mouth daily.    . meloxicam (MOBIC) 15 MG tablet Take 15 mg by mouth daily.    Marland Kitchen. oxybutynin (DITROPAN-XL) 5 MG 24 hr tablet Take 5 mg by mouth daily.    Marland Kitchen. oxycodone (OXY-IR) 5 MG capsule Take 5-15 mg by mouth every 4 (four) hours as needed for pain. Give 5mg  for moderate pain, give 10mg  for severe pain    . [DISCONTINUED] lisinopril (PRINIVIL,ZESTRIL) 20 MG tablet Take 20 mg by mouth daily.       Psychiatric Specialty Exam:     Blood pressure 111/61, pulse 67, temperature 97.8 F (36.6 C), temperature source Axillary, resp. rate 16, SpO2 97.00%.There is no weight on file to calculate BMI.  General Appearance: Disheveled  Eye Contact::  Minimal  Speech:  Garbled  Volume:  Decreased  Mood:  Euthymic  Affect:  Congruent  Thought Process:  forwards very little  Orientation:  Other:  oriented to self  Thought Content:  little verbal communication  Suicidal Thoughts:  No  Homicidal Thoughts:  No  Memory:  Immediate;   Poor Recent;   Poor Remote;   Poor  Judgement:  Impaired  Insight:  Lacking  Psychomotor Activity:  Decreased  Concentration:  Poor  Recall:   Poor  Fund of Knowledge: forwards little   Language:  minimial   Akathisia:  No  Handed:  Right  AIMS (if indicated):     Assets:  Financial Resources/Insurance Housing Leisure Time Resilience Social Support  Sleep:       Musculoskeletal: Strength & Muscle Tone: within normal limits Gait & Station: normal Patient leans: N/A  Treatment Plan Summary Discharge home to his Skilled Nursing Facility, stable at this time, no agitation  Nanine MeansLORD, JAMISON, PMH-NP 05/24/2014 3:35 PM     Patient seen, evaluated and I agree with notes by Nurse Practitioner. Thedore MinsMojeed Jamey Demchak, MD

## 2014-05-25 ENCOUNTER — Encounter (HOSPITAL_COMMUNITY): Payer: Self-pay | Admitting: Registered Nurse

## 2014-05-25 MED ORDER — QUETIAPINE FUMARATE 25 MG PO TABS: 25.0000 mg | ORAL_TABLET | Freq: Two times a day (BID) | ORAL | Status: DC

## 2014-05-25 MED ORDER — VALPROIC ACID 250 MG/5ML PO SYRP: 250.0000 mg | ORAL_SOLUTION | Freq: Two times a day (BID) | ORAL | Status: DC

## 2014-05-25 NOTE — ED Notes (Signed)
Patient turned to his left side for comfort. Pillow under right side.

## 2014-05-25 NOTE — Discharge Instructions (Signed)
Adjustment Disorder Most changes in life can cause stress. Getting used to changes may take a few months or longer. If feelings of stress, hopelessness, or worry continue, you may have an adjustment disorder. This stress-related mental health problem may affect your feelings, thinking and how you act. It occurs in both sexes and happens at any age. SYMPTOMS  Some of the following problems may be seen and vary from person to person:  Sadness or depression.  Loss of enjoyment.  Thoughts of suicide.  Fighting.  Avoiding family and friends.  Poor school performance.  Hopelessness, sense of loss.  Trouble sleeping.  Vandalism.  Worry, weight loss or gain.  Crying spells.  Anxiety  Reckless driving.  Skipping school.  Poor work International aid/development workerperformance.  Nervousness.  Ignoring bills.  Poor attitude. DIAGNOSIS  Your caregiver will ask what has happened in your life and do a physical exam. They will make a diagnosis of an adjustment disorder when they are sure another problem or medical illness causing your feelings does not exist. TREATMENT  When problems caused by stress interfere with you daily life or last longer than a few months, you may need counseling for an adjustment disorder. Early treatment may diminish problems and help you to better cope with the stressful events in your life. Sometimes medication is necessary. Individual counseling and or support groups can be very helpful. PROGNOSIS  Adjustment disorders usually last less than 3 to 6 months. The condition may persist if there is long lasting stress. This could include health problems, relationship problems, or job difficulties where you can not easily escape from what is causing the problem. PREVENTION  Even the most mentally healthy, highly functioning people can suffer from an adjustment disorder given a significant blow from a life-changing event. There is no way to prevent pain and loss. Most people need help from time  to time. You are not alone. SEEK MEDICAL CARE IF:  Your feelings or symptoms listed above do not improve or worsen. Document Released: 03/13/2006 Document Revised: 10/01/2011 Document Reviewed: 06/04/2007 [facility] Patient Information 2015 [place]. This information is not intended to replace advice given to you by your health care provider. Make sure you discuss any questions you have with your health care provider.  Dementia Dementia is a word that is used to describe problems with the brain and how it works. People with dementia have memory loss. They may also have problems with thinking, speaking, or solving problems. It can affect how they act around people, how they do their job, their mood, and their personality. These changes may not show up for a long time. Family or friends may not notice problems in the early part of this disease. HOME CARE The following tips are for the person living with, or caring for, the person with dementia. Make the home safe.  Remove locks on bathroom doors.  Use childproof locks on cabinets where alcohol, cleaning supplies, or chemicals are stored.  Put outlet covers in electrical outlets.  Put in childproof locks to keep doors and windows safe.  Remove stove knobs, or put in safety knobs that shut off on their own.  Lower the temperature on water heaters.  Label medicines. Lock them in a safe place.  Keep knives, lighters, matches, power tools, and guns out of reach or in a safe place.  Remove objects that might break or can hurt the person.  Make sure lighting is good inside and outside.  Put in grab bars if needed.  Use a device that detects falls or other needs for help. Lessen confusion.  Keep familiar objects and people around.  Use night lights or low lit (dim) lights at night.  Label objects or areas.  Use reminders, notes, or directions for daily activities or tasks.  Keep a simple routine that is the same for waking,  meals, bathing, dressing, and bedtime.  Create a calm and quiet home.  Put up clocks and calendars.  Keep emergency numbers and the home address near all phones.  Help show the different times of day. Open the curtains during the day to let light in. Speak clearly and directly.  Choose simple words and short sentences.  Use a gentle, calm voice.  Do not interrupt.  If the person has a hard time finding a word to use, give them the word or thought.  Ask 1 question at a time. Give enough time for the person to answer. Repeat the question if the person does not answer. Do things that lessen restlessness.  Provide a comfortable bed.  Have the same bedtime routine every night.  Have a regular walking and activity schedule.  Lessen naps during the day.  Do not let the person drink a lot of caffeine.  Go to events that are not overwhelming. Eat well and drink fluids.  Lessen distractions during meal times and snacks.  Avoid foods that are too hot or too cold.  Watch how the person chews and swallows. This is to make sure they do not choke. Other  Keep all vision, hearing, dental, and medical visits with the doctor.  Only give medicines as told by the doctor.  Watch the person's driving ability. Do not let the person drive if he or she cannot drive safely.  Use a program that helps find a person if they become missing. You may need to register with this program. GET HELP RIGHT AWAY IF:   A fever of 102 F (38.9 C) develops.  Confusion develops or gets worse.  Sleepiness develops or gets worse.  Staying awake is hard to do.  New behavior problems start like mood swings, aggression, and seeing things that are not there.  Problems with balance, speech, or falling develop.  Problems swallowing develop.  Any problems of another sickness develop. MAKE SURE YOU:  Understand these instructions.  Will watch his or her condition.  Will get help right away if he  or she is not doing well or gets worse. Document Released: 06/21/2008 Document Revised: 10/01/2011 Document Reviewed: 12/04/2010 [facility] Patient Information 2015 [place], [place]. This information is not intended to replace advice given to you by your health care provider. Make sure you discuss any questions you have with your health care provider.  Dementia Dementia is a word that is used to describe problems with the brain and how it works. People with dementia have memory loss. They may also have problems with thinking, speaking, or solving problems. It can affect how they act around people, how they do their job, their mood, and their personality. These changes may not show up for a long time. Family or friends may not notice problems in the early part of this disease. HOME CARE The following tips are for the person living with, or caring for, the person with dementia. Make the home safe.  Remove locks on bathroom doors.  Use childproof locks on cabinets where alcohol, cleaning supplies, or chemicals are stored.  Put outlet covers in electrical outlets.  Put in childproof locks  to keep doors and windows safe.  Remove stove knobs, or put in safety knobs that shut off on their own.  Lower the temperature on water heaters.  Label medicines. Lock them in a safe place.  Keep knives, lighters, matches, power tools, and guns out of reach or in a safe place.  Remove objects that might break or can hurt the person.  Make sure lighting is good inside and outside.  Put in grab bars if needed.  Use a device that detects falls or other needs for help. Lessen confusion.  Keep familiar objects and people around.  Use night lights or low lit (dim) lights at night.  Label objects or areas.  Use reminders, notes, or directions for daily activities or tasks.  Keep a simple routine that is the same for waking, meals, bathing, dressing, and bedtime.  Create a calm and quiet home.  Put  up clocks and calendars.  Keep emergency numbers and the home address near all phones.  Help show the different times of day. Open the curtains during the day to let light in. Speak clearly and directly.  Choose simple words and short sentences.  Use a gentle, calm voice.  Do not interrupt.  If the person has a hard time finding a word to use, give them the word or thought.  Ask 1 question at a time. Give enough time for the person to answer. Repeat the question if the person does not answer. Do things that lessen restlessness.  Provide a comfortable bed.  Have the same bedtime routine every night.  Have a regular walking and activity schedule.  Lessen naps during the day.  Do not let the person drink a lot of caffeine.  Go to events that are not overwhelming. Eat well and drink fluids.  Lessen distractions during meal times and snacks.  Avoid foods that are too hot or too cold.  Watch how the person chews and swallows. This is to make sure they do not choke. Other  Keep all vision, hearing, dental, and medical visits with the doctor.  Only give medicines as told by the doctor.  Watch the person's driving ability. Do not let the person drive if he or she cannot drive safely.  Use a program that helps find a person if they become missing. You may need to register with this program. GET HELP RIGHT AWAY IF:   A fever of 102 F (38.9 C) develops.  Confusion develops or gets worse.  Sleepiness develops or gets worse.  Staying awake is hard to do.  New behavior problems start like mood swings, aggression, and seeing things that are not there.  Problems with balance, speech, or falling develop.  Problems swallowing develop.  Any problems of another sickness develop. MAKE SURE YOU:  Understand these instructions.  Will watch his or her condition.  Will get help right away if he or she is not doing well or gets worse. Document Released: 06/21/2008 Document  Revised: 10/01/2011 Document Reviewed: 12/04/2010 [facility] Patient Information 2015 [place], [place]. This information is not intended to replace advice given to you by your health care provider. Make sure you discuss any questions you have with your health care provider.

## 2014-05-25 NOTE — Progress Notes (Addendum)
[facility] pending insurance authorization. CSW contacted Mardella LaymanLindsey from [facility] at [phone]. Mardella LaymanLindsey will review pt information and will call back with authorization.   Byrd HesselbachKristen Zayra Devito, LCSW [phone]  ED CSW 05/25/2014 1341pm    CSW obtained verbal authoirzation from [employer]. [employer] to follow up with [facility]. Melissa [facility] setting up time to compelte paperwork with family. [facility] will call back with time to arrange transportation.  Byrd HesselbachKristen Darien Kading, LCSW [phone]  ED CSW 05/25/2014 1402pm   CSW spoke with Efraim KaufmannMelissa at [facility]. Pt room to be ready after 4pm, and transportation can be arranged for 4pm. CSW called to confirm with pt spouse, awaiting return call.   Byrd HesselbachKristen Seger Jani, LCSW [phone]  ED CSW 05/25/2014 1424pm

## 2014-05-25 NOTE — Progress Notes (Signed)
[facility]. Per Efraim KaufmannMelissa at [facility] transportation to be set up at [employer]4pm. CSW confirmed with pt spouse. Pt spouse may or may not be coming back to the hosptial before pt is [facility]. Pt glasses are in pt room and need to go with patient. RN can call report to [phone]. Pt to be transported by ptar. Ptar form and fl2 in pt chart.   Byrd HesselbachKristen Elick Aguilera, LCSW [phone]  ED CSW 05/25/2014 1437pm

## 2014-05-25 NOTE — ED Notes (Signed)
[facility], unsure of plan at this time, charge will need to speak with case management.

## 2014-05-25 NOTE — ED Notes (Signed)
Pt more calm currently; no s/s of distress noted at this time.

## 2014-05-25 NOTE — Progress Notes (Addendum)
05/25/14 1230 ED CM called wife at [phone].  Asked her which home health agency she chose that CM can assist her with services Wife did not provide a name of an agency.  "I'm working on something else still. Not right now." Cm inquired if wife understood that if pt d/c to [facility] she could continue to "work on something else." and he could be transferred to the facility of her choice from [facility] at anytime She told CM she understood that.   Encouraged wife to please provide CM with her choice Inquired if she had time to review home health list and she stated she had reviewed list but still would not provide a choice CM inquired if she intended to take pt home She would not state yes or no Discussed again with her that [facility] She voiced she understood She took down CM office number and informed CM she would call her back because she was "still working on something" "as of today or tonight I could not bring him here"   05/25/14 1110 ED Cm & ED SW spoke with wife in rm #31 about pt disposition.  Cm introduced herself and expressed concern with not receiving a return call from spouse on 05/24/14 about home health.  Wife did not offer a reason for not returning a call to Cm.  Wife again mentions pt had home health and it did not work out.  wife discussed they did not stay long Cm explained to wife the differences in home health and private duty nursing  CM reviewed in details medicare guidelines, home health [facility]) (length of stay in home, types of [facility] staff available, coverage, primary caregiver, up to 24 hrs before services may be started), Private duty nursing (PDN-coverage, length of stay in the home types of staff available).  Wife then stated she had been "working on something" [facility].  When C[facility], she said she only did a tour, "they were with some other family" and "they are going to get back to  my daughter"  [facility] did not provide a bed offer for this pt to wife during her tour this am.  CM present when ED SW informed wife [facility] unable to provide a bed offer to pt on 05/24/14 but she would contact them again.  CM used teach back method and inquired of wife if she understood what would be occurring today for pt.  She stated "we will see about what happens" Cm educated her on about pt d/c plan options for 05/25/14 1) SW re contact [facility] 2) [facility] available for placement 3) Cm assist with d/c home with home health Cm provided wife with copies of home health agencies to assist with choice and a list of PDNs. Wife informed Cm she was "up under medical care herself" but continues to drive and care for herself.  Wife asked for Cm Name after stating "so you are telling me I am taking him home" . Cm provided Cm name as "Kim"  Cm explained to wife that the pt remains in the emergency room and is not inpatient in the hospital after ED SW explained that pt was medically and psychiatrically cleared. Cm explained to wife that Emergency room is still outpatient care and emergency room care is for getting pts out of crises not for  holding pts for extended times if they have a d/c plan.  Explained to wife that pt generally only admitted to hospital if there is a medical reason and pt does not have a medical reason for admission.  Wife stated " I can't believe he stayed in the emergency room this long" "Yes, he looks calm today." per wife in response to ED SW discussion of pt being medically  CM inquired about DME at home. Wife stated pt has a "rolling walker at home but does not use it" Cm explained to wife that if pt needs oxygen at home, [facility] generally requests use of [employer]. Wife without response

## 2014-05-25 NOTE — Progress Notes (Signed)
CSW called pt spouse, Thomasenia BottomsRose Soroka at [phone], call was answered and no one could be heard then hung up. CSW attempted to call again, and left message on voicemail.    CSW also called patient daughter Marylene Landngela at [phone] that patient spouse directed CSW to speak with yesterday regarding placements. CSW unable to leave message as voicemail box is full.   Byrd HesselbachKristen Malak Orantes, LCSW [phone]  ED CSW 05/25/2014 0907am

## 2014-05-25 NOTE — ED Notes (Signed)
Patient's O2 removed to test for saturation. Nasal cannula off for 3 minutes, O2 sats went from 98% to 90% on room air. O2 returned to 98% when cannula placed back on.

## 2014-05-25 NOTE — Progress Notes (Signed)
Report given to Almira CoasterGina, [facility]. Will continue to monitor patient until transportation comes.

## 2014-05-25 NOTE — Consult Note (Signed)
[facility] Follow Up Psychiatry Consult   Reason for Consult:  Agitation Referring Physician:  EDP  [name] is an 80 y.o. male. Total Time spent with patient: 20 minutes  Assessment: AXIS I:  Agitation, dementia AXIS II:  Deferred AXIS III:   Past Medical History  Diagnosis Date  . Hypertension   . Anemia   . Thrombocytopenia   . A-fib   . Peripheral vascular disease   . Fatigue   . Osteoarthritis   . Hyperlipidemia   . Dysrhythmia     hx af   AXIS IV:  other psychosocial or environmental problems and problems related to social environment AXIS V:  Mild symptoms, 72  Plan:  Discharge back to a skilled nursing facility  Subjective:   [name] is a 80 y.o. male patient does not warrant admission.  HPI:   Patient in bed with eyes closed moving hands like he is working on something.  Called patient name twice and he did not answer or open his eyes.  Nursing states that he has been calm this morning but agitated last night.    Family is refusing to take patient home and does not want patient to go back to the nursing home prior to hospital visit.      HPI Elements:   Location:  generalized. Quality:  acute. Severity:  moderate. Timing:  intermittent. Duration:  few weeks. Context:  stressors.  Past Psychiatric History: Past Medical History  Diagnosis Date  . Hypertension   . Anemia   . Thrombocytopenia   . A-fib   . Peripheral vascular disease   . Fatigue   . Osteoarthritis   . Hyperlipidemia   . Dysrhythmia     hx af    reports that he quit smoking about 45 years ago. He has never used smokeless tobacco. He reports that he does not drink alcohol or use illicit drugs. Family History  Problem Relation Age of Onset  . Alzheimer's disease Mother 110  . Cancer Father 32    prostate cancer  . Cancer Brother     unknown cancer           Allergies:   Allergies  Allergen Reactions  . Lisinopril     Patient states it causes runny nose,  itchiness, and allergy-type symptoms.    ACT Assessment Complete:  Yes:    Educational Status    Risk to Self: Risk to self with the past 6 months Is patient at risk for suicide?: No Substance abuse history and/or treatment for substance abuse?: No  Risk to Others:    Abuse:    Prior Inpatient Therapy:    Prior Outpatient Therapy:    Additional Information:                    Objective: Blood pressure 100/55, pulse 66, temperature 98 F (36.7 C), temperature source Axillary, resp. rate 17, SpO2 96 %.There is no weight on file to calculate BMI. No results found for this or any previous visit (from the past 72 hour(s)). Labs are reviewed and are pertinent for no medical issues.  Current Facility-Administered Medications  Medication Dose Route Frequency Provider Last Rate Last Dose  . acetaminophen (TYLENOL) tablet 650 mg  650 mg Oral Q4H PRN Mora Bellman, PA-C      . alum & mag hydroxide-simeth (MAALOX/MYLANTA) 200-200-20 MG/5ML suspension 30 mL  30 mL Oral PRN Mora Bellman, PA-C      .  amiodarone (PACERONE) tablet 200 mg  200 mg Oral Daily Nanine MeansJamison Lord, NP   200 mg at 05/25/14 1015  . atorvastatin (LIPITOR) tablet 20 mg  20 mg Oral Daily Nanine MeansJamison Lord, NP   20 mg at 05/25/14 1015  . feeding supplement (ENSURE COMPLETE) (ENSURE COMPLETE) liquid 237 mL  237 mL Oral BID BM Lyanne CoKevin M Campos, MD   237 mL at 05/25/14 1015  . furosemide (LASIX) tablet 20 mg  20 mg Oral Daily Nanine MeansJamison Lord, NP   20 mg at 05/25/14 1014  . LORazepam (ATIVAN) tablet 1 mg  1 mg Oral Q8H PRN Mora BellmanHannah S Merrell, PA-C   1 mg at 05/24/14 2020  . losartan (COZAAR) tablet 50 mg  50 mg Oral Daily Nanine MeansJamison Lord, NP   50 mg at 05/25/14 1014  . nicotine (NICODERM CQ - dosed in mg/24 hours) patch 21 mg  21 mg Transdermal Daily Mora BellmanHannah S Merrell, PA-C   21 mg at 05/18/14 1158  . ondansetron (ZOFRAN) tablet 4 mg  4 mg Oral Q8H PRN Mora BellmanHannah S Merrell, PA-C      . oxybutynin (DITROPAN-XL) 24 hr tablet 5 mg  5 mg  Oral Daily Nanine MeansJamison Lord, NP   5 mg at 05/25/14 1014  . oxyCODONE (Oxy IR/ROXICODONE) immediate release tablet 5-15 mg  5-15 mg Oral Q4H PRN Nanine MeansJamison Lord, NP   5 mg at 05/24/14 2020  . QUEtiapine (SEROQUEL) tablet 25 mg  25 mg Oral BID Shuvon Rankin, NP   25 mg at 05/25/14 1015  . Valproic Acid (DEPAKENE) 250 MG/5ML syrup SYRP 250 mg  250 mg Oral BID Nanine MeansJamison Lord, NP   250 mg at 05/25/14 [id number]   Current Outpatient Prescriptions  Medication Sig Dispense Refill  . amiodarone (PACERONE) 200 MG tablet Take 200 mg by mouth daily.      Marland Kitchen. atorvastatin (LIPITOR) 20 MG tablet Take 1 tablet (20 mg total) by mouth daily. 30 tablet 6  . furosemide (LASIX) 20 MG tablet Take 20 mg by mouth daily.    . haloperidol lactate (HALDOL) 5 MG/ML injection Inject 2 mg into the muscle every 6 (six) hours as needed (for agitation related to dementia).    Marland Kitchen. losartan (COZAAR) 50 MG tablet Take 50 mg by mouth daily.    . meloxicam (MOBIC) 15 MG tablet Take 15 mg by mouth daily.    Marland Kitchen. oxybutynin (DITROPAN-XL) 5 MG 24 hr tablet Take 5 mg by mouth daily.    Marland Kitchen. oxycodone (OXY-IR) 5 MG capsule Take 5-15 mg by mouth every 4 (four) hours as needed for pain. Give 5mg  for moderate pain, give 10mg  for severe pain    . [DISCONTINUED] lisinopril (PRINIVIL,ZESTRIL) 20 MG tablet Take 20 mg by mouth daily.       Psychiatric Specialty Exam:     Blood pressure 111/61, pulse 67, temperature 97.8 F (36.6 C), temperature source Axillary, resp. rate 16, SpO2 97.00%.There is no weight on file to calculate BMI.  General Appearance: Disheveled  Eye Contact::  Minimal  Speech:  Garbled  Volume:  Decreased  Mood:  Euthymic  Affect:  Congruent  Thought Process:  forwards very little  Orientation:  Other:  oriented to self  Thought Content:  little verbal communication  Suicidal Thoughts:  No  Homicidal Thoughts:  No  Memory:  Immediate;   Poor Recent;   Poor Remote;   Poor  Judgement:  Impaired  Insight:  Lacking  Psychomotor Activity:   Decreased  Concentration:  Poor  Recall:  Poor  Fund of Knowledge: forwards little   Language: minimal   Akathisia:  No  Handed:  Right  AIMS (if indicated):     Assets:  Health and safety inspectorinancial Resources/Insurance Housing Leisure Time Resilience Social Support  Sleep:       Musculoskeletal: Strength & Muscle Tone: within normal limits Gait & Station: normal Patient leans: N/A  Treatment Plan Summary  Discharge to a skilled nursing facility.  If no facility found patient is to be discharged home.  Assunta FoundRankin, Shuvon, FNP-BC 05/25/2014 3:51 PM    Patient seen, evaluated and I agree with notes by Nurse Practitioner. Thedore MinsMojeed British Moyd, MD

## 2014-05-25 NOTE — ED Notes (Signed)
Patient turned to his left side, peri care done, and new brief put on. Pt's dressing clean, dry and intact. No s/s of distress noted at this time.

## 2014-05-25 NOTE — Progress Notes (Addendum)
CSW met with pt spouse to update on pt discharge plan. CSW informed pt spouse that she has left a message for [facility]. CSW discussed with pt spouse that if patient is not accepted to [facility], and patient spouse does not wish for patient to be admitted to [facility] then patient will need to be discharge home and pt family will need to make arrangements. Patient spouse stated she can not care for patient at home. CSW reiinterated that patient family would need to make appropriate arrangements for patient to discharge home or to a skilled nursing facility.  Patient spouse stated, "I heard what you said, but I did not say Ok." CSW and pt spouse discussed that if patient family were unable to make arrangements for patients discharge then CSW would have to follow up with [facility] and that they would pursue guardianship of patient if pt spouse unable to make an appropriate discharge plan for patient. Pt spouse excused herself and asked for her keys and phone from locker so she could leave. CSW to make call to patient spouse and family by 1pm. If patient family unable to determine a discharge plan for today, CSW to follow upw with CSW Director and [facility].    , LCSW [phone]  ED CSW 05/25/2014 1126am    

## 2014-05-25 NOTE — Progress Notes (Signed)
CSW and RN CM met with pt spouse at pt bedside to discuss patient disposition. CSW reviewed with pt spouse that patient spouse wished for all options of skilled nursing facilities to be considered in [employer]. CSW and pt spouse reviewed that the only offer at this time was [facility]. Pt spouse stated that she did not wish for patient to be discharged to [facility]. Pt spouse shared that she went to tour [facility] yesterday and was waiting for the admissions to call pt daughter, Levada Dy back. CSW called [facility] and left a message regarding referral. As of right now, [facility] has not extended a skilled nursing facility bed to patient. CSW and RN CM reiinterated that patient has remained medically and psychiatrically stable and patient will be discharged today. CSW and RN CM provided teach back regarding patient discharge plan with pt spouse. Pt spouse states she understands that patient is medically and psychiatrically stable for discharge to skilled nursing or home with family and home health services.    Noreene Larsson [phone]  ED CSW 05/25/2014 1108am

## 2014-05-27 ENCOUNTER — Emergency Department (HOSPITAL_COMMUNITY): Payer: Medicare HMO

## 2014-05-27 ENCOUNTER — Inpatient Hospital Stay (HOSPITAL_COMMUNITY)
Admission: EM | Admit: 2014-05-27 | Discharge: 2014-06-02 | DRG: 871 | Disposition: A | Discharge status: Skilled Nursing Facility | Payer: Medicare HMO | Attending: Internal Medicine | Admitting: Internal Medicine

## 2014-05-27 ENCOUNTER — Inpatient Hospital Stay (HOSPITAL_COMMUNITY): Payer: Medicare HMO

## 2014-05-27 DIAGNOSIS — M1990 Unspecified osteoarthritis, unspecified site: Secondary | ICD-10-CM | POA: Diagnosis present

## 2014-05-27 DIAGNOSIS — B9620 Unspecified Escherichia coli [E. coli] as the cause of diseases classified elsewhere: Secondary | ICD-10-CM | POA: Diagnosis present

## 2014-05-27 DIAGNOSIS — R0902 Hypoxemia: Secondary | ICD-10-CM | POA: Diagnosis present

## 2014-05-27 DIAGNOSIS — F03918 Unspecified dementia, unspecified severity, with other behavioral disturbance: Secondary | ICD-10-CM | POA: Diagnosis present

## 2014-05-27 DIAGNOSIS — E785 Hyperlipidemia, unspecified: Secondary | ICD-10-CM | POA: Diagnosis present

## 2014-05-27 DIAGNOSIS — E860 Dehydration: Secondary | ICD-10-CM | POA: Diagnosis present

## 2014-05-27 DIAGNOSIS — Z87891 Personal history of nicotine dependence: Secondary | ICD-10-CM

## 2014-05-27 DIAGNOSIS — I5032 Chronic diastolic (congestive) heart failure: Secondary | ICD-10-CM | POA: Diagnosis present

## 2014-05-27 DIAGNOSIS — G9340 Encephalopathy, unspecified: Secondary | ICD-10-CM | POA: Diagnosis present

## 2014-05-27 DIAGNOSIS — A419 Sepsis, unspecified organism: Principal | ICD-10-CM | POA: Diagnosis present

## 2014-05-27 DIAGNOSIS — Z66 Do not resuscitate: Secondary | ICD-10-CM | POA: Diagnosis present

## 2014-05-27 DIAGNOSIS — Z9181 History of falling: Secondary | ICD-10-CM

## 2014-05-27 DIAGNOSIS — F01518 Vascular dementia, unspecified severity, with other behavioral disturbance: Secondary | ICD-10-CM | POA: Diagnosis present

## 2014-05-27 DIAGNOSIS — I272 Other secondary pulmonary hypertension: Secondary | ICD-10-CM | POA: Diagnosis present

## 2014-05-27 DIAGNOSIS — R4182 Altered mental status, unspecified: Secondary | ICD-10-CM | POA: Diagnosis present

## 2014-05-27 DIAGNOSIS — Z7189 Other specified counseling: Secondary | ICD-10-CM

## 2014-05-27 DIAGNOSIS — E861 Hypovolemia: Secondary | ICD-10-CM | POA: Diagnosis not present

## 2014-05-27 DIAGNOSIS — M4856XA Collapsed vertebra, not elsewhere classified, lumbar region, initial encounter for fracture: Secondary | ICD-10-CM | POA: Diagnosis present

## 2014-05-27 DIAGNOSIS — K5900 Constipation, unspecified: Secondary | ICD-10-CM | POA: Diagnosis not present

## 2014-05-27 DIAGNOSIS — I270 Primary pulmonary hypertension: Secondary | ICD-10-CM | POA: Diagnosis not present

## 2014-05-27 DIAGNOSIS — I4891 Unspecified atrial fibrillation: Secondary | ICD-10-CM | POA: Diagnosis present

## 2014-05-27 DIAGNOSIS — N390 Urinary tract infection, site not specified: Secondary | ICD-10-CM | POA: Diagnosis present

## 2014-05-27 DIAGNOSIS — I480 Paroxysmal atrial fibrillation: Secondary | ICD-10-CM

## 2014-05-27 DIAGNOSIS — I129 Hypertensive chronic kidney disease with stage 1 through stage 4 chronic kidney disease, or unspecified chronic kidney disease: Secondary | ICD-10-CM | POA: Diagnosis present

## 2014-05-27 DIAGNOSIS — Z820 Family history of epilepsy and other diseases of the nervous system: Secondary | ICD-10-CM | POA: Diagnosis not present

## 2014-05-27 DIAGNOSIS — N179 Acute kidney failure, unspecified: Secondary | ICD-10-CM | POA: Diagnosis present

## 2014-05-27 DIAGNOSIS — D696 Thrombocytopenia, unspecified: Secondary | ICD-10-CM | POA: Diagnosis not present

## 2014-05-27 DIAGNOSIS — I739 Peripheral vascular disease, unspecified: Secondary | ICD-10-CM | POA: Diagnosis not present

## 2014-05-27 DIAGNOSIS — L89159 Pressure ulcer of sacral region, unspecified stage: Secondary | ICD-10-CM

## 2014-05-27 DIAGNOSIS — N2889 Other specified disorders of kidney and ureter: Secondary | ICD-10-CM

## 2014-05-27 DIAGNOSIS — Z79899 Other long term (current) drug therapy: Secondary | ICD-10-CM | POA: Diagnosis not present

## 2014-05-27 DIAGNOSIS — S32000A Wedge compression fracture of unspecified lumbar vertebra, initial encounter for closed fracture: Secondary | ICD-10-CM

## 2014-05-27 DIAGNOSIS — R627 Adult failure to thrive: Secondary | ICD-10-CM | POA: Diagnosis present

## 2014-05-27 DIAGNOSIS — N182 Chronic kidney disease, stage 2 (mild): Secondary | ICD-10-CM | POA: Diagnosis present

## 2014-05-27 DIAGNOSIS — F0391 Unspecified dementia with behavioral disturbance: Secondary | ICD-10-CM | POA: Diagnosis not present

## 2014-05-27 DIAGNOSIS — N3000 Acute cystitis without hematuria: Secondary | ICD-10-CM | POA: Diagnosis not present

## 2014-05-27 DIAGNOSIS — I10 Essential (primary) hypertension: Secondary | ICD-10-CM | POA: Diagnosis present

## 2014-05-27 DIAGNOSIS — E870 Hyperosmolality and hypernatremia: Secondary | ICD-10-CM | POA: Diagnosis present

## 2014-05-27 DIAGNOSIS — F0151 Vascular dementia with behavioral disturbance: Secondary | ICD-10-CM | POA: Diagnosis present

## 2014-05-27 DIAGNOSIS — S32000S Wedge compression fracture of unspecified lumbar vertebra, sequela: Secondary | ICD-10-CM | POA: Diagnosis not present

## 2014-05-27 DIAGNOSIS — Z515 Encounter for palliative care: Secondary | ICD-10-CM | POA: Diagnosis not present

## 2014-05-27 LAB — I-STAT CHEM 8, ED
BUN: 83 mg/dL — ABNORMAL HIGH (ref 6–23)
Calcium, Ion: 1.2 mmol/L (ref 1.13–1.30)
Chloride: 115 mEq/L — ABNORMAL HIGH (ref 96–112)
Creatinine, Ser: 1.8 mg/dL — ABNORMAL HIGH (ref 0.50–1.35)
GLUCOSE: 114 mg/dL — AB (ref 70–99)
HEMATOCRIT: 40 % (ref 39.0–52.0)
HEMOGLOBIN: 13.6 g/dL (ref 13.0–17.0)
Potassium: 5.7 mEq/L — ABNORMAL HIGH (ref 3.7–5.3)
Sodium: 153 mEq/L — ABNORMAL HIGH (ref 137–147)
TCO2: 32 mmol/L (ref 0–100)

## 2014-05-27 LAB — CBC WITH DIFFERENTIAL/PLATELET
Basophils Absolute: 0 10*3/uL (ref 0.0–0.1)
Basophils Relative: 0 % (ref 0–1)
Eosinophils Absolute: 0 10*3/uL (ref 0.0–0.7)
Eosinophils Relative: 0 % (ref 0–5)
HCT: 40 % (ref 39.0–52.0)
HEMOGLOBIN: 12.7 g/dL — AB (ref 13.0–17.0)
LYMPHS ABS: 0.8 10*3/uL (ref 0.7–4.0)
Lymphocytes Relative: 10 % — ABNORMAL LOW (ref 12–46)
MCH: 32.2 pg (ref 26.0–34.0)
MCHC: 31.8 g/dL (ref 30.0–36.0)
MCV: 101.5 fL — ABNORMAL HIGH (ref 78.0–100.0)
MONOS PCT: 8 % (ref 3–12)
Monocytes Absolute: 0.6 10*3/uL (ref 0.1–1.0)
Neutro Abs: 5.9 10*3/uL (ref 1.7–7.7)
Neutrophils Relative %: 82 % — ABNORMAL HIGH (ref 43–77)
Platelets: 136 10*3/uL — ABNORMAL LOW (ref 150–400)
RBC: 3.94 MIL/uL — AB (ref 4.22–5.81)
RDW: 13.5 % (ref 11.5–15.5)
WBC: 7.3 10*3/uL (ref 4.0–10.5)

## 2014-05-27 LAB — COMPREHENSIVE METABOLIC PANEL
ALK PHOS: 77 U/L (ref 39–117)
ALT: 20 U/L (ref 0–53)
ANION GAP: 16 — AB (ref 5–15)
AST: 26 U/L (ref 0–37)
Albumin: 3.5 g/dL (ref 3.5–5.2)
BILIRUBIN TOTAL: 1.3 mg/dL — AB (ref 0.3–1.2)
BUN: 56 mg/dL — AB (ref 6–23)
CO2: 29 mEq/L (ref 19–32)
Calcium: 9.7 mg/dL (ref 8.4–10.5)
Chloride: 110 mEq/L (ref 96–112)
Creatinine, Ser: 1.8 mg/dL — ABNORMAL HIGH (ref 0.50–1.35)
GFR, EST AFRICAN AMERICAN: 40 mL/min — AB (ref >90)
GFR, EST NON AFRICAN AMERICAN: 34 mL/min — AB (ref >90)
GLUCOSE: 114 mg/dL — AB (ref 70–99)
POTASSIUM: 4.4 meq/L (ref 3.7–5.3)
Sodium: 155 mEq/L — ABNORMAL HIGH (ref 137–147)
Total Protein: 7.6 g/dL (ref 6.0–8.3)

## 2014-05-27 LAB — URINALYSIS, ROUTINE W REFLEX MICROSCOPIC
BILIRUBIN URINE: NEGATIVE
Glucose, UA: NEGATIVE mg/dL
Ketones, ur: NEGATIVE mg/dL
NITRITE: NEGATIVE
PROTEIN: NEGATIVE mg/dL
Specific Gravity, Urine: 1.019 (ref 1.005–1.030)
UROBILINOGEN UA: 1 mg/dL (ref 0.0–1.0)
pH: 5.5 (ref 5.0–8.0)

## 2014-05-27 LAB — CBG MONITORING, ED: Glucose-Capillary: 79 mg/dL (ref 70–99)

## 2014-05-27 LAB — I-STAT TROPONIN, ED: Troponin i, poc: 0.01 ng/mL (ref 0.00–0.08)

## 2014-05-27 LAB — URINE MICROSCOPIC-ADD ON

## 2014-05-27 LAB — I-STAT CG4 LACTIC ACID, ED: Lactic Acid, Venous: 4.91 mmol/L — ABNORMAL HIGH (ref 0.5–2.2)

## 2014-05-27 LAB — CK: Total CK: 414 U/L — ABNORMAL HIGH (ref 7–232)

## 2014-05-27 MED ORDER — HEPARIN SODIUM (PORCINE) 5000 UNIT/ML IJ SOLN
5000.0000 [IU] | Freq: Three times a day (TID) | INTRAMUSCULAR | Status: DC
  Administered 2014-05-27 – 2014-06-02 (×17): 5000 [IU] via SUBCUTANEOUS
  Filled 2014-05-27 (×20): qty 1

## 2014-05-27 MED ORDER — SODIUM CHLORIDE 0.9 % IV BOLUS (SEPSIS)
1000.0000 mL | Freq: Once | INTRAVENOUS | Status: AC
  Administered 2014-05-27: 1000 mL via INTRAVENOUS

## 2014-05-27 MED ORDER — SODIUM CHLORIDE 0.45 % IV SOLN
INTRAVENOUS | Status: DC
  Administered 2014-05-27 – 2014-05-28 (×2): via INTRAVENOUS

## 2014-05-27 MED ORDER — DEXTROSE 5 % IV SOLN
1.0000 g | INTRAVENOUS | Status: DC
  Administered 2014-05-27 – 2014-05-30 (×4): 1 g via INTRAVENOUS
  Filled 2014-05-27 (×5): qty 10

## 2014-05-27 NOTE — ED Notes (Addendum)
Patient transported to POD C and spoke with CT who stated patient is scheduled for CT.

## 2014-05-27 NOTE — ED Notes (Signed)
Pt able to speak and have conversation with RN

## 2014-05-27 NOTE — ED Notes (Signed)
Pt moving upper extremities and follows commands.  Attempting to pull of pulse ox.  Follows commands by placing arms by side.

## 2014-05-27 NOTE — ED Notes (Signed)
Pt taken to radiology

## 2014-05-27 NOTE — ED Notes (Signed)
Called flow manager for status of bed assignment.  Currently awaiting d/c.

## 2014-05-27 NOTE — ED Provider Notes (Signed)
Level V caveat altered mental status. History is obtained from his family, wife and daughter who accompany him and from paramedics. Patient reportedly was less responsive at nursing home today with blood pressure ofapproximately 42/12. EMS treated patient with intranasal Narcan. With somewhat improvement of mental status. Patient currently receiving subcutaneous fluids for dehydration. Presently patient looks at his baseline per family members. He is somewhat combative, moves all extremities. Speech is slurred and incomprehensible. Patient's mental status has been similar to today's since he was involved in anaccident involving a fall several weeks ago. Patient had CT scan of brain10/25/2015 showing no acute abnormality.  Doug SouSam Bravery Ketcham, MD 05/27/14 [phone]

## 2014-05-27 NOTE — ED Notes (Signed)
Spoke with CT regarding CT head was cancelled and now reordered by admit Doctor. CT will send for patient when ready.

## 2014-05-27 NOTE — ED Notes (Signed)
Admit Doctor at bedside.  

## 2014-05-27 NOTE — ED Provider Notes (Signed)
CSN: [id number]     Arrival date & time 05/27/14  1359 History   First MD Initiated Contact with Patient 05/27/14 1417     Chief Complaint  Patient presents with  . Altered Mental Status    HPI Comments: 80 y.o. Male presenting from a rehab facility due to a period of unresponsiveness with an oxygen desaturation to 75% on room air and a manual BP of 40s/10s. This lasted several minutes. Oxygen placed on the patient during this time with slow improvement of his symptoms. EMS was called. Systolics in the 100s for EMS.   Patient is a 80 y.o. male presenting with syncope. The history is provided by the nursing home, medical records and a relative.  Loss of Consciousness Episode history:  Single Most recent episode:  Today Timing:  Constant Progression:  Partially resolved Chronicity:  New Context: sitting down   Witnessed: yes   Relieved by:  Nothing Worsened by:  Nothing tried Ineffective treatments:  None tried Associated symptoms comment:  Unable to assess; patient is minimally verbal Risk factors comment:  Nursing home resident   Past Medical History  Diagnosis Date  . Hypertension   . Anemia   . Thrombocytopenia   . A-fib   . Peripheral vascular disease   . Fatigue   . Osteoarthritis   . Hyperlipidemia   . Dysrhythmia     hx af   Past Surgical History  Procedure Laterality Date  . Neck surgery      Multiple-ACDF C3-C4--5/12. ACDF revision C4-C7--11/02  . Prostatectomy  1992  . 2-d echocardiogram  02/19/2012    Ejection fraction 65-70%. Normal wall motion. Normal diastolic function parameters. Mild MR. Moderate left atrium dilatation. Moderate TR. Right atrium severely dilated. PA pressure was 76 mmHg  . Persantine myoview stress test  11/13/2010    No reversible ischemia. Small to moderate fixed inferior to inferior apical defect  . Colonoscopy    . Breast surgery    . Knee arthroscopy Left 04/05/2014    Procedure: LEFT ARTHROSCOPY KNEE, PARTIAL LATERAL MENISECTOMY,  DEBRIDEMENT CHONROMALCIA;  Surgeon: Nestor Lewandowsky, MD;  Location: [facility];  Service: Orthopedics;  Laterality: Left;   Family History  Problem Relation Age of Onset  . Alzheimer's disease Mother 81  . Cancer Father 37    prostate cancer  . Cancer Brother     unknown cancer   History  Substance Use Topics  . Smoking status: Former Smoker    Quit date: 03/31/1969  . Smokeless tobacco: Never Used  . Alcohol Use: No    Review of Systems  Unable to perform ROS: Patient nonverbal  Cardiovascular: Positive for syncope.    Allergies  Lisinopril  Home Medications   Prior to Admission medications   Medication Sig Start Date End Date Taking? Authorizing Provider  amiodarone (PACERONE) 200 MG tablet Take 200 mg by mouth daily.      Historical Provider, MD  atorvastatin (LIPITOR) 20 MG tablet Take 1 tablet (20 mg total) by mouth daily. 12/24/12   Marykay Lex, MD  furosemide (LASIX) 20 MG tablet Take 20 mg by mouth daily.    Historical Provider, MD  haloperidol lactate (HALDOL) 5 MG/ML injection Inject 2 mg into the muscle every 6 (six) hours as needed (for agitation related to dementia).    Historical Provider, MD  losartan (COZAAR) 50 MG tablet Take 50 mg by mouth daily.    Historical Provider, MD  meloxicam (MOBIC) 15 MG tablet Take 15  mg by mouth daily.    Historical Provider, MD  oxybutynin (DITROPAN-XL) 5 MG 24 hr tablet Take 5 mg by mouth daily.    Historical Provider, MD  oxycodone (OXY-IR) 5 MG capsule Take 5-15 mg by mouth every 4 (four) hours as needed for pain. Give 5mg  for moderate pain, give 10mg  for severe pain    Historical Provider, MD  QUEtiapine (SEROQUEL) 25 MG tablet Take 1 tablet (25 mg total) by mouth 2 (two) times daily. 05/25/14   Shuvon Rankin, NP  Valproic Acid (DEPAKENE) 250 MG/5ML SYRP syrup Take 5 mLs (250 mg total) by mouth 2 (two) times daily. 05/25/14   Shuvon Rankin, NP    Filed Vitals:   05/27/14 1410 05/27/14 1440 05/27/14 1500  BP:  130/54 124/54 128/102  Pulse: 72 73 68  Resp: 16 16 16   SpO2: 100% 100% 100%   Rectal Temp 98 F  Physical Exam  Constitutional: He appears well-developed and well-nourished. No distress.  Able to answer his name, though difficult to understand  HENT:  Head: Normocephalic and atraumatic.  Right Ear: External ear normal.  Left Ear: External ear normal.  Neck: Normal range of motion.  Cardiovascular: Normal rate.   Pulmonary/Chest: Effort normal and breath sounds normal. No respiratory distress. He has no wheezes.  Abdominal: Soft. He exhibits no distension. There is no tenderness.  20g angiocath inserted superficially into subcutaneous tissue  Musculoskeletal: Normal range of motion.  Neurological:  Moving all extremities spontaneously, able to state his name; does not follow commands  Skin: Skin is warm and dry. No rash noted. He is not diaphoretic.  Psychiatric:  Somewhat agitated    ED Course  Procedures  Labs Review  Results for orders placed or performed during the hospital encounter of 05/27/14  CBC with Differential  Result Value Ref Range   WBC 7.3 4.0 - 10.5 K/uL   RBC 3.94 (L) 4.22 - 5.81 MIL/uL   Hemoglobin 12.7 (L) 13.0 - 17.0 g/dL   HCT 40.940.0 81.139.0 - 91.452.0 %   MCV 101.5 (H) 78.0 - 100.0 fL   MCH 32.2 26.0 - 34.0 pg   MCHC 31.8 30.0 - 36.0 g/dL   RDW 78.213.5 95.611.5 - 21.315.5 %   Platelets 136 (L) 150 - 400 K/uL   Neutrophils Relative % 82 (H) 43 - 77 %   Neutro Abs 5.9 1.7 - 7.7 K/uL   Lymphocytes Relative 10 (L) 12 - 46 %   Lymphs Abs 0.8 0.7 - 4.0 K/uL   Monocytes Relative 8 3 - 12 %   Monocytes Absolute 0.6 0.1 - 1.0 K/uL   Eosinophils Relative 0 0 - 5 %   Eosinophils Absolute 0.0 0.0 - 0.7 K/uL   Basophils Relative 0 0 - 1 %   Basophils Absolute 0.0 0.0 - 0.1 K/uL  Comprehensive metabolic panel  Result Value Ref Range   Sodium 155 (H) 137 - 147 mEq/L   Potassium 4.4 3.7 - 5.3 mEq/L   Chloride 110 96 - 112 mEq/L   CO2 29 19 - 32 mEq/L   Glucose, Bld 114  (H) 70 - 99 mg/dL   BUN 56 (H) 6 - 23 mg/dL   Creatinine, Ser 0.861.80 (H) 0.50 - 1.35 mg/dL   Calcium 9.7 8.4 - 57.810.5 mg/dL   Total Protein 7.6 6.0 - 8.3 g/dL   Albumin 3.5 3.5 - 5.2 g/dL   AST 26 0 - 37 U/L   ALT 20 0 - 53 U/L   Alkaline Phosphatase  77 39 - 117 U/L   Total Bilirubin 1.3 (H) 0.3 - 1.2 mg/dL   GFR calc non Af Amer 34 (L) >90 mL/min   GFR calc Af Amer 40 (L) >90 mL/min   Anion gap 16 (H) 5 - 15  Urinalysis, Routine w reflex microscopic  Result Value Ref Range   Color, Urine AMBER (A) YELLOW   APPearance CLOUDY (A) CLEAR   Specific Gravity, Urine 1.019 1.005 - 1.030   pH 5.5 5.0 - 8.0   Glucose, UA NEGATIVE NEGATIVE mg/dL   Hgb urine dipstick TRACE (A) NEGATIVE   Bilirubin Urine NEGATIVE NEGATIVE   Ketones, ur NEGATIVE NEGATIVE mg/dL   Protein, ur NEGATIVE NEGATIVE mg/dL   Urobilinogen, UA 1.0 0.0 - 1.0 mg/dL   Nitrite NEGATIVE NEGATIVE   Leukocytes, UA LARGE (A) NEGATIVE  Urine microscopic-add on  Result Value Ref Range   Squamous Epithelial / LPF RARE RARE   WBC, UA 21-50 <3 WBC/hpf   RBC / HPF 0-2 <3 RBC/hpf   Bacteria, UA MANY (A) RARE  CBG monitoring, ED  Result Value Ref Range   Glucose-Capillary 79 70 - 99 mg/dL  I-stat Chem 8, ED  Result Value Ref Range   Sodium 153 (H) 137 - 147 mEq/L   Potassium 5.7 (H) 3.7 - 5.3 mEq/L   Chloride 115 (H) 96 - 112 mEq/L   BUN 83 (H) 6 - 23 mg/dL   Creatinine, Ser 1.61 (H) 0.50 - 1.35 mg/dL   Glucose, Bld 096 (H) 70 - 99 mg/dL   Calcium, Ion 0.45 4.09 - 1.30 mmol/L   TCO2 32 0 - 100 mmol/L   Hemoglobin 13.6 13.0 - 17.0 g/dL   HCT 81.1 91.4 - 78.2 %  I-Stat CG4 Lactic Acid, ED  Result Value Ref Range   Lactic Acid, Venous 4.91 (H) 0.5 - 2.2 mmol/L  I-stat troponin, ED  Result Value Ref Range   Troponin i, poc 0.01 0.00 - 0.08 ng/mL   Comment 3             Imaging Review Dg Chest Portable 1 View  05/27/2014   CLINICAL DATA:  Altered mental status  EXAM: PORTABLE CHEST - 1 VIEW  COMPARISON:  07/19/2013   FINDINGS: The heart size and mediastinal contours are within normal limits. Both lungs are clear. The visualized skeletal structures are unremarkable.  IMPRESSION: No active disease.   Electronically Signed   By: Marlan Palau M.D.   On: 05/27/2014 15:22   Dg Abd Portable 1v  05/27/2014   CLINICAL DATA:  Abdominal pain.  EXAM: PORTABLE ABDOMEN - 1 VIEW  COMPARISON:  07/06/2011  FINDINGS: There are no distended loops of large or small bowel. There is fairly extensive stool of the left side of the colon including the rectum. No dilated loops of large or small bowel. No worrisome abdominal calcifications. Diffuse degenerative changes in the lumbar spine, stable. Multiple surgical clips in the pelvis.  IMPRESSION: No acute abnormality. Prominent stool in the left side of the colon and rectum.   Electronically Signed   By: Geanie Cooley M.D.   On: 05/27/2014 16:53     EKG Interpretation   Date/Time:  Thursday May 27 2014 14:19:05 EST Ventricular Rate:  74 PR Interval:  52 QRS Duration: 115 QT Interval:  483 QTC Calculation: 536 R Axis:   -77 Text Interpretation:  Sinus rhythm Short PR interval Left atrial  enlargement Nonspecific IVCD with LAD Abnormal T, consider ischemia,  lateral leads Artifact in  lead(s) I III aVL V1 V2 V4 V5 V6 No significant  change since last tracing Confirmed by Ethelda ChickJACUBOWITZ  MD, SAM [phone]([id number]) on  05/27/2014 3:36:15 PM      MDM   Final diagnoses:  Altered mental status    80 y.o. male presents due to an episode of unresponsiveness associated with hypotension and hypoxia. He was given narcan en route with possibly a mild improvement in his level of consciousness. He was never hypotensive for EMS or on arrival.   Exam as above. Difficult to examine due to not following commands, mildly agitated.   Concern for infection, seizure, dehydration, arrhythmia.   EKG appears unchanged from prior. Sodium elevated at 155. BUN/Cr elevated. Lactic acid elevated. Given 1L  normal saline. Initial troponin negative. Work up reveals UTI. Given Rocephin.    Discussed his case with the hospitalist. Will obtain CT head based on our discussion, as well as a KUB. [facility]   CT head pending at the time of admission.   This case managed in conjunction with my attending, Dr. Ethelda ChickJacubowitz.      Maxine GlennAnn Tmya Wigington, MD 05/27/14 1708  Doug SouSam Jacubowitz, MD 05/27/14 [id number]

## 2014-05-27 NOTE — ED Notes (Signed)
Lactic acid results given to Dr Melene MullerBatista

## 2014-05-27 NOTE — ED Notes (Signed)
Family at bedside speaking to MD.
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
## 2014-05-27 NOTE — ED Notes (Signed)
[facility].  Staff stated pt was wheeled to table after eating and all-the-sudden slumped over in his chair.  Staff was unable to procure a response and called EMS.  EMS  States pupils were 4, sluggish, rr 8, gcs 6.  Ems states controlled afib on ekg, cbg 159, bp 108/50.  Pt given 1 mg narcan intranasal  - ems states some response .  Pt now fighting staff's attempt to take vs, etc.

## 2014-05-27 NOTE — ED Notes (Signed)
Spoke with Company secretarylow Manager currently waiting for Step Down bed.
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
## 2014-05-28 DIAGNOSIS — F0391 Unspecified dementia with behavioral disturbance: Secondary | ICD-10-CM

## 2014-05-28 DIAGNOSIS — N3000 Acute cystitis without hematuria: Secondary | ICD-10-CM

## 2014-05-28 DIAGNOSIS — I270 Primary pulmonary hypertension: Secondary | ICD-10-CM

## 2014-05-28 LAB — BASIC METABOLIC PANEL
ANION GAP: 12 (ref 5–15)
BUN: 51 mg/dL — ABNORMAL HIGH (ref 6–23)
CHLORIDE: 115 meq/L — AB (ref 96–112)
CO2: 27 meq/L (ref 19–32)
CREATININE: 1.55 mg/dL — AB (ref 0.50–1.35)
Calcium: 9 mg/dL (ref 8.4–10.5)
GFR calc non Af Amer: 41 mL/min — ABNORMAL LOW (ref >90)
GFR, EST AFRICAN AMERICAN: 47 mL/min — AB (ref >90)
Glucose, Bld: 93 mg/dL (ref 70–99)
POTASSIUM: 4.3 meq/L (ref 3.7–5.3)
Sodium: 154 mEq/L — ABNORMAL HIGH (ref 137–147)

## 2014-05-28 LAB — CBC
HEMATOCRIT: 35.2 % — AB (ref 39.0–52.0)
HEMOGLOBIN: 11.1 g/dL — AB (ref 13.0–17.0)
MCH: 32.2 pg (ref 26.0–34.0)
MCHC: 31.5 g/dL (ref 30.0–36.0)
MCV: 102 fL — ABNORMAL HIGH (ref 78.0–100.0)
Platelets: 85 10*3/uL — ABNORMAL LOW (ref 150–400)
RBC: 3.45 MIL/uL — AB (ref 4.22–5.81)
RDW: 13.5 % (ref 11.5–15.5)
WBC: 6.3 10*3/uL (ref 4.0–10.5)

## 2014-05-28 LAB — MRSA PCR SCREENING: MRSA by PCR: NEGATIVE

## 2014-05-28 MED ORDER — ONDANSETRON HCL 4 MG PO TABS: 4.0000 mg | ORAL_TABLET | Freq: Four times a day (QID) | ORAL | Status: DC | PRN

## 2014-05-28 MED ORDER — AMIODARONE HCL 200 MG PO TABS
200.0000 mg | ORAL_TABLET | Freq: Every day | ORAL | Status: DC
  Administered 2014-05-28 – 2014-06-02 (×6): 200 mg via ORAL
  Filled 2014-05-28 (×6): qty 1

## 2014-05-28 MED ORDER — ACETAMINOPHEN 325 MG PO TABS: 650.0000 mg | ORAL_TABLET | Freq: Four times a day (QID) | ORAL | Status: DC | PRN

## 2014-05-28 MED ORDER — GUAIFENESIN-DM 100-10 MG/5ML PO SYRP
5.0000 mL | ORAL_SOLUTION | ORAL | Status: DC | PRN
Start: 2014-05-28 — End: 2014-05-31
  Filled 2014-05-28: qty 5

## 2014-05-28 MED ORDER — DEXTROSE 5 % IV SOLN
INTRAVENOUS | Status: DC
  Administered 2014-05-28 – 2014-05-30 (×3): via INTRAVENOUS

## 2014-05-28 MED ORDER — CETYLPYRIDINIUM CHLORIDE 0.05 % MT LIQD
7.0000 mL | Freq: Two times a day (BID) | OROMUCOSAL | Status: DC
  Administered 2014-05-29 – 2014-06-02 (×8): 7 mL via OROMUCOSAL

## 2014-05-28 MED ORDER — CHLORHEXIDINE GLUCONATE 0.12 % MT SOLN
15.0000 mL | Freq: Two times a day (BID) | OROMUCOSAL | Status: DC
  Administered 2014-05-28 – 2014-06-02 (×10): 15 mL via OROMUCOSAL
  Filled 2014-05-28 (×13): qty 15

## 2014-05-28 MED ORDER — ALBUTEROL SULFATE (2.5 MG/3ML) 0.083% IN NEBU: 2.5000 mg | INHALATION_SOLUTION | RESPIRATORY_TRACT | Status: DC | PRN

## 2014-05-28 MED ORDER — OXYBUTYNIN CHLORIDE ER 5 MG PO TB24
5.0000 mg | ORAL_TABLET | Freq: Every day | ORAL | Status: DC
  Administered 2014-05-28 – 2014-05-29 (×2): 5 mg via ORAL
  Filled 2014-05-28 (×2): qty 1

## 2014-05-28 MED ORDER — HALOPERIDOL LACTATE 5 MG/ML IJ SOLN: 2.0000 mg | Freq: Four times a day (QID) | INTRAMUSCULAR | Status: DC | PRN

## 2014-05-28 MED ORDER — ENSURE COMPLETE PO LIQD
237.0000 mL | Freq: Two times a day (BID) | ORAL | Status: DC
  Administered 2014-05-29 – 2014-06-02 (×8): 237 mL via ORAL

## 2014-05-28 MED ORDER — QUETIAPINE FUMARATE 25 MG PO TABS
25.0000 mg | ORAL_TABLET | Freq: Two times a day (BID) | ORAL | Status: DC
  Administered 2014-05-28 (×2): 25 mg via ORAL
  Filled 2014-05-28 (×5): qty 1

## 2014-05-28 MED ORDER — ACETAMINOPHEN 650 MG RE SUPP: 650.0000 mg | Freq: Four times a day (QID) | RECTAL | Status: DC | PRN

## 2014-05-28 MED ORDER — SODIUM CHLORIDE 0.9 % IJ SOLN
3.0000 mL | Freq: Two times a day (BID) | INTRAMUSCULAR | Status: DC
  Administered 2014-05-28 – 2014-05-31 (×3): 3 mL via INTRAVENOUS

## 2014-05-28 MED ORDER — DIVALPROEX SODIUM 125 MG PO CPSP
125.0000 mg | ORAL_CAPSULE | Freq: Two times a day (BID) | ORAL | Status: DC
  Administered 2014-05-28 – 2014-05-29 (×3): 125 mg via ORAL
  Filled 2014-05-28 (×5): qty 1

## 2014-05-28 MED ORDER — ONDANSETRON HCL 4 MG/2ML IJ SOLN: 4.0000 mg | Freq: Four times a day (QID) | INTRAMUSCULAR | Status: DC | PRN

## 2014-05-28 NOTE — Progress Notes (Signed)
Pt. Arrived to the unit via bed accompanied by wife. Placed on telemetry. Pt. Has free movement of extremities and is able to respond to some simple commands. Mittens noted on pt. Upper extremities to prevent pulling/tearing of tele. Leads and IV lines. Speech is slurred. Stage 2 wound noted on coccyx. Placed pink foam dressing (allevyn) on buttock. Spoke with pt. Wife and she stated that she would like for pt. To received flu vaccine during admission. Educated pt. Wife on use of call bell and purpose of bed alarm. No further needs noted at this time.
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
## 2014-05-28 NOTE — Progress Notes (Signed)
Transferred to 5W 11 from 3S, lethargic but arousable.  Placed on Telemetry box 13, CCM notified.  Will continue to monitor.  Forbes Cellarelcine Damarco Keysor, RN

## 2014-05-28 NOTE — Progress Notes (Signed)
Attempted to call report. No answer.

## 2014-05-28 NOTE — Progress Notes (Signed)
Report called to Sierra RN on 5W.  

## 2014-05-28 NOTE — Progress Notes (Signed)
Wife just arrived at bedside. Informed her of pt transfer to 5W11.

## 2014-05-28 NOTE — Progress Notes (Signed)
UR note completed. 

## 2014-05-28 NOTE — Plan of Care (Signed)
Problem: Consults Goal: Skin Care Protocol Initiated - if Braden Score 18 or less If consults are not indicated, leave blank or document N/A Outcome: Progressing Goal: Nutrition Consult-if indicated Outcome: Progressing  Problem: Phase I Progression Outcomes Goal: Voiding-avoid urinary catheter unless indicated Outcome: Progressing Condom cath. Placed    Goal: Hemodynamically stable Outcome: Progressing  Problem: Phase II Progression Outcomes Goal: Vital signs remain stable Outcome: Progressing

## 2014-05-28 NOTE — Progress Notes (Signed)
Pt transferred to 5W11.
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
## 2014-05-28 NOTE — ED Notes (Signed)
Attempted to call report

## 2014-05-28 NOTE — Evaluation (Signed)
Physical Therapy Evaluation Patient Details Name: [name] MRN: [id number] DOB: 03-Oct-1932 Today's Date: 05/28/2014   History of Present Illness  pt presents with Encephalopathy and hx of recent admit for AMS and falls.  pt has hx of Dementia.    Clinical Impression  Pt demonstrates no participation in mobility at this time.  Note that pt is from SNF and will continue to need SNF level of care at D/C.  Will defer further therapies to SNF.  Will sign off.      Follow Up Recommendations SNF    Equipment Recommendations   (TBD)    Recommendations for Other Services       Precautions / Restrictions Precautions Precautions: Fall Restrictions Weight Bearing Restrictions: No      Mobility  Bed Mobility Overal bed mobility: Needs Assistance Bed Mobility: Supine to Sit;Sit to Supine     Supine to sit: Total assist Sit to supine: Total assist   General bed mobility comments: pt not participating at all in mobility.    Transfers                    Ambulation/Gait                Stairs            Wheelchair Mobility    Modified Rankin (Stroke Patients Only)       Balance Overall balance assessment: Needs assistance Sitting-balance support: Bilateral upper extremity supported;Feet supported Sitting balance-Leahy Scale: Zero Sitting balance - Comments: pt unable to maitnain static sitting and demos minimal attempts at correcting LOB.                                       Pertinent Vitals/Pain Pain Assessment: No/denies pain    Home Living Family/patient expects to be discharged to:: Skilled nursing facility                 Additional Comments: pt has been in SNF since D/C from [facility] in October.      Prior Function Level of Independence: Needs assistance         Comments: pt from SNF sicne mid October, though pt unable to provide any infor about PLOF.  Per chart pt had been ambulatory prior to admit to [facility] in  middle of October.       Hand Dominance        Extremity/Trunk Assessment   Upper Extremity Assessment: Difficult to assess due to impaired cognition           Lower Extremity Assessment: Difficult to assess due to impaired cognition      Cervical / Trunk Assessment: Kyphotic  Communication   Communication: No difficulties  Cognition Arousal/Alertness: Lethargic Behavior During Therapy: Flat affect Overall Cognitive Status: Impaired/Different from baseline Area of Impairment: Orientation;Attention;Memory;Following commands;Safety/judgement;Awareness;Problem solving Orientation Level: Disoriented to;Place;Time;Situation     Following Commands: Follows one step commands inconsistently       General Comments: No family rpesent to determine baseline, but pt has hx of Dementia.  pt answers some simple questions, but is perseverating on his birthday.  pt maintained eyes closed throughout session.      General Comments      Exercises        Assessment/Plan    PT Assessment All further PT needs can be met in the next venue of care  PT Diagnosis Altered  mental status;Generalized weakness;Difficulty walking   PT Problem List    PT Treatment Interventions     PT Goals (Current goals can be found in the Care Plan section) Acute Rehab PT Goals PT Goal Formulation: All assessment and education complete, DC therapy    Frequency     Barriers to discharge        Co-evaluation               End of Session   Activity Tolerance: Patient limited by lethargy Patient left: in bed;with call bell/phone within reach;with bed alarm set Nurse Communication: Mobility status         Time: 0370-9643 PT Time Calculation (min): 12 min   Charges:   PT Evaluation $Initial PT Evaluation Tier I: 1 Procedure     PT G CodesCatarina Hartshorn, Jenkins 05/28/2014, 9:46 AM
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
## 2014-05-29 ENCOUNTER — Encounter (HOSPITAL_COMMUNITY): Payer: Self-pay | Admitting: *Deleted

## 2014-05-29 DIAGNOSIS — I10 Essential (primary) hypertension: Secondary | ICD-10-CM

## 2014-05-29 LAB — BASIC METABOLIC PANEL
Anion gap: 9 (ref 5–15)
BUN: 36 mg/dL — AB (ref 6–23)
CHLORIDE: 109 meq/L (ref 96–112)
CO2: 28 meq/L (ref 19–32)
Calcium: 8.8 mg/dL (ref 8.4–10.5)
Creatinine, Ser: 1.45 mg/dL — ABNORMAL HIGH (ref 0.50–1.35)
GFR calc Af Amer: 51 mL/min — ABNORMAL LOW (ref >90)
GFR calc non Af Amer: 44 mL/min — ABNORMAL LOW (ref >90)
GLUCOSE: 114 mg/dL — AB (ref 70–99)
POTASSIUM: 3.7 meq/L (ref 3.7–5.3)
SODIUM: 146 meq/L (ref 137–147)

## 2014-05-29 MED ORDER — INFLUENZA VAC SPLIT QUAD 0.5 ML IM SUSY
0.5000 mL | PREFILLED_SYRINGE | INTRAMUSCULAR | Status: AC
  Administered 2014-05-30: 0.5 mL via INTRAMUSCULAR
  Filled 2014-05-29: qty 0.5

## 2014-05-29 MED ORDER — QUETIAPINE FUMARATE 25 MG PO TABS
25.0000 mg | ORAL_TABLET | Freq: Every day | ORAL | Status: DC
  Administered 2014-05-30: 25 mg via ORAL
  Filled 2014-05-29 (×2): qty 1

## 2014-05-29 MED ORDER — PNEUMOCOCCAL VAC POLYVALENT 25 MCG/0.5ML IJ INJ
0.5000 mL | INJECTION | INTRAMUSCULAR | Status: AC
  Administered 2014-05-30: 0.5 mL via INTRAMUSCULAR
  Filled 2014-05-29: qty 0.5

## 2014-05-29 MED ORDER — VANCOMYCIN HCL IN DEXTROSE 1-5 GM/200ML-% IV SOLN
1000.0000 mg | Freq: Once | INTRAVENOUS | Status: AC
  Administered 2014-05-29: 1000 mg via INTRAVENOUS
  Filled 2014-05-29: qty 200

## 2014-05-29 MED ORDER — VANCOMYCIN HCL IN DEXTROSE 750-5 MG/150ML-% IV SOLN
750.0000 mg | Freq: Two times a day (BID) | INTRAVENOUS | Status: DC
  Administered 2014-05-30: 750 mg via INTRAVENOUS
  Filled 2014-05-29 (×3): qty 150

## 2014-05-29 NOTE — Plan of Care (Signed)
Problem: Phase I Progression Outcomes Goal: Initial discharge plan identified Outcome: Progressing Goal: Hemodynamically stable Outcome: Progressing Goal: Other Phase I Outcomes/Goals Outcome: Not Applicable Date Met:  82/42/35
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
## 2014-05-29 NOTE — Progress Notes (Signed)
[facility] 1 Aerobic bottle positive for gram positive rods. No new orders at this time. Barbera Settersurner, Naomii Kreger B RN
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
## 2014-05-30 DIAGNOSIS — E860 Dehydration: Secondary | ICD-10-CM | POA: Diagnosis present

## 2014-05-30 DIAGNOSIS — A419 Sepsis, unspecified organism: Secondary | ICD-10-CM | POA: Diagnosis not present

## 2014-05-30 DIAGNOSIS — B9620 Unspecified Escherichia coli [E. coli] as the cause of diseases classified elsewhere: Secondary | ICD-10-CM

## 2014-05-30 DIAGNOSIS — N390 Urinary tract infection, site not specified: Secondary | ICD-10-CM

## 2014-05-30 DIAGNOSIS — N179 Acute kidney failure, unspecified: Secondary | ICD-10-CM | POA: Diagnosis present

## 2014-05-30 DIAGNOSIS — E870 Hyperosmolality and hypernatremia: Secondary | ICD-10-CM | POA: Diagnosis present

## 2014-05-30 LAB — BASIC METABOLIC PANEL
Anion gap: 11 (ref 5–15)
BUN: 21 mg/dL (ref 6–23)
CO2: 25 mEq/L (ref 19–32)
Calcium: 8.8 mg/dL (ref 8.4–10.5)
Chloride: 104 mEq/L (ref 96–112)
Creatinine, Ser: 1.02 mg/dL (ref 0.50–1.35)
GFR, EST AFRICAN AMERICAN: 79 mL/min — AB (ref >90)
GFR, EST NON AFRICAN AMERICAN: 68 mL/min — AB (ref >90)
Glucose, Bld: 114 mg/dL — ABNORMAL HIGH (ref 70–99)
POTASSIUM: 3.9 meq/L (ref 3.7–5.3)
Sodium: 140 mEq/L (ref 137–147)

## 2014-05-30 LAB — AMMONIA: Ammonia: 63 umol/L — ABNORMAL HIGH (ref 11–60)

## 2014-05-30 MED ORDER — SODIUM CHLORIDE 0.9 % IJ SOLN: 3.0000 mL | INTRAMUSCULAR | Status: DC | PRN

## 2014-05-30 MED ORDER — SODIUM CHLORIDE 0.9 % IJ SOLN
3.0000 mL | Freq: Two times a day (BID) | INTRAMUSCULAR | Status: DC
  Administered 2014-05-30 – 2014-06-02 (×3): 3 mL via INTRAVENOUS

## 2014-05-30 MED ORDER — SODIUM CHLORIDE 0.9 % IV SOLN: 250.0000 mL | INTRAVENOUS | Status: DC | PRN

## 2014-05-30 NOTE — Progress Notes (Signed)
Palliative medicine team goals of care meeting scheduled for 11/9 with Mrs. [name] and her daughter- they are traveling out of town to a funeral and are going to call me when they return- we have set late meeting for 5PM but PMT will try to accommodate them earlier if possible.I explained the role of the palliative team and services that we offer and I have reviewed Mr. [name] chart in detail.  Anderson MaltaElizabeth Donica Derouin, DO Palliative Medicine [phone]
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
## 2014-05-31 DIAGNOSIS — L89159 Pressure ulcer of sacral region, unspecified stage: Secondary | ICD-10-CM

## 2014-05-31 DIAGNOSIS — Z7189 Other specified counseling: Secondary | ICD-10-CM

## 2014-05-31 DIAGNOSIS — N390 Urinary tract infection, site not specified: Secondary | ICD-10-CM

## 2014-05-31 DIAGNOSIS — N2889 Other specified disorders of kidney and ureter: Secondary | ICD-10-CM

## 2014-05-31 DIAGNOSIS — F0151 Vascular dementia with behavioral disturbance: Secondary | ICD-10-CM | POA: Diagnosis present

## 2014-05-31 DIAGNOSIS — S32000A Wedge compression fracture of unspecified lumbar vertebra, initial encounter for closed fracture: Secondary | ICD-10-CM

## 2014-05-31 DIAGNOSIS — Z515 Encounter for palliative care: Secondary | ICD-10-CM

## 2014-05-31 DIAGNOSIS — F01518 Vascular dementia, unspecified severity, with other behavioral disturbance: Secondary | ICD-10-CM | POA: Diagnosis present

## 2014-05-31 LAB — URINE CULTURE: Colony Count: >=100000

## 2014-05-31 LAB — GLUCOSE, CAPILLARY: GLUCOSE-CAPILLARY: 119 mg/dL — AB (ref 70–99)

## 2014-05-31 LAB — CULTURE, BLOOD (ROUTINE X 2)

## 2014-05-31 LAB — VITAMIN B12: VITAMIN B 12: 849 pg/mL (ref 211–911)

## 2014-05-31 LAB — FOLATE RBC: RBC Folate: 706 ng/mL — ABNORMAL HIGH (ref >280)

## 2014-05-31 MED ORDER — TAMSULOSIN HCL 0.4 MG PO CAPS
0.4000 mg | ORAL_CAPSULE | Freq: Every day | ORAL | Status: DC
  Administered 2014-05-31 – 2014-06-01 (×2): 0.4 mg via ORAL
  Filled 2014-05-31 (×4): qty 1

## 2014-05-31 MED ORDER — OXYCODONE HCL 5 MG/5ML PO SOLN
5.0000 mg | Freq: Three times a day (TID) | ORAL | Status: DC
  Administered 2014-05-31 – 2014-06-02 (×5): 5 mg via ORAL
  Filled 2014-05-31 (×6): qty 5

## 2014-05-31 MED ORDER — FLEET ENEMA 7-19 GM/118ML RE ENEM
1.0000 | ENEMA | Freq: Every day | RECTAL | Status: DC | PRN
  Filled 2014-05-31: qty 1

## 2014-05-31 MED ORDER — ACETAMINOPHEN 325 MG PO TABS
650.0000 mg | ORAL_TABLET | Freq: Three times a day (TID) | ORAL | Status: DC
  Administered 2014-05-31 – 2014-06-02 (×6): 650 mg via ORAL
  Filled 2014-05-31 (×5): qty 2

## 2014-05-31 MED ORDER — CEPHALEXIN 500 MG PO CAPS
500.0000 mg | ORAL_CAPSULE | Freq: Two times a day (BID) | ORAL | Status: DC
  Administered 2014-05-31 – 2014-06-02 (×5): 500 mg via ORAL
  Filled 2014-05-31 (×8): qty 1

## 2014-05-31 MED ORDER — HALOPERIDOL LACTATE 2 MG/ML PO CONC
1.0000 mg | ORAL | Status: DC | PRN
  Filled 2014-05-31: qty 0.5

## 2014-05-31 MED ORDER — HALOPERIDOL LACTATE 2 MG/ML PO CONC: 2.0000 mg | ORAL | Status: DC | PRN

## 2014-05-31 MED ORDER — OXYCODONE HCL 5 MG/5ML PO SOLN
5.0000 mg | ORAL | Status: DC | PRN
  Administered 2014-06-01: 5 mg via ORAL

## 2014-05-31 MED ORDER — OXYCODONE HCL 20 MG/ML PO CONC: 5.0000 mg | ORAL | Status: DC | PRN

## 2014-05-31 MED ORDER — LACTULOSE 10 GM/15ML PO SOLN
30.0000 g | Freq: Every day | ORAL | Status: DC
  Administered 2014-05-31 – 2014-06-02 (×3): 30 g via ORAL
  Filled 2014-05-31 (×3): qty 45

## 2014-05-31 MED ORDER — SENNOSIDES-DOCUSATE SODIUM 8.6-50 MG PO TABS
2.0000 | ORAL_TABLET | Freq: Every day | ORAL | Status: DC
  Administered 2014-05-31 – 2014-06-01 (×2): 2 via ORAL
  Filled 2014-05-31 (×2): qty 2

## 2014-05-31 MED ORDER — OXYCODONE HCL 20 MG/ML PO CONC: 5.0000 mg | Freq: Three times a day (TID) | ORAL | Status: DC

## 2014-05-31 NOTE — Clinical Social Work Psychosocial (Signed)
Clinical Social Work Department BRIEF PSYCHOSOCIAL ASSESSMENT 05/31/2014  Patient:  [name], [name]     Account Number:  [id number]     Admit date:  05/27/2014  Clinical Social Worker:  Lovey Newcomer  Date/Time:  05/31/2014 04:40 PM  Referred by:  Physician  Date Referred:  05/31/2014 Referred for  SNF Placement   Other Referral:   NA   Interview type:  Family Other interview type:   Patient's wife and daughters and interviewed at bedside to complete assessment as patient unable to contribute to assessment at this time.    PSYCHOSOCIAL DATA Living Status:  WIFE Admitted from facility:  [facility] Level of care:  [place] Primary support name:  [name] Primary support relationship to patient:  SPOUSE Degree of support available:   Support is strong.    CURRENT CONCERNS Current Concerns  Post-Acute Placement   Other Concerns:   NA    SOCIAL WORK ASSESSMENT / PLAN CSW met with family at bedside to complete assessment. Patient's wife [name] states that the patient was admitted from [facility]. Patient was at the facility for two days prior to being readmitted. Rose states that the plan is for the patient to return to [facility] at discharge. Family plans to meet with PMT at 5:00PM today to discuss [place]. Family is very supportive and engaged in assessment.   Assessment/plan status:  Psychosocial Support/Ongoing Assessment of Needs Other assessment/ plan:   Complete Fl2, Fax, PASRR   Information/referral to community resources:   CSW contact information given.    PATIENT'S/FAMILY'S RESPONSE TO PLAN OF CARE: Patient's family plans for the patient to DC to [facility] once medically stable. Information has been faxed to the facility and [facility] has been contacted for auth.         Liz Beach MSW, [place], [place], [phone]

## 2014-05-31 NOTE — Progress Notes (Signed)
SLP Cancellation Note  Patient Details Name: [name] L Schuchard MRN: [id number] DOB: 06/02/1933   Cancelled treatment:        SLP unable to arouse adequately to observe with po's given tactile and verba cues.  Noted Palliative care meeting pending.  ST will continue efforts.   Royce MacadamiaLitaker, Sohana Austell Willis 05/31/2014, 11:15 AM   Breck CoonsLisa Willis Lonell FaceLitaker M.Ed [employer] Pager [phone]
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
## 2014-06-01 DIAGNOSIS — S32000S Wedge compression fracture of unspecified lumbar vertebra, sequela: Secondary | ICD-10-CM

## 2014-06-01 DIAGNOSIS — Z7189 Other specified counseling: Secondary | ICD-10-CM

## 2014-06-01 LAB — BASIC METABOLIC PANEL
Anion gap: 13 (ref 5–15)
BUN: 22 mg/dL (ref 6–23)
CO2: 24 meq/L (ref 19–32)
CREATININE: 1.14 mg/dL (ref 0.50–1.35)
Calcium: 8.7 mg/dL (ref 8.4–10.5)
Chloride: 104 mEq/L (ref 96–112)
GFR calc non Af Amer: 59 mL/min — ABNORMAL LOW (ref >90)
GFR, EST AFRICAN AMERICAN: 69 mL/min — AB (ref >90)
GLUCOSE: 114 mg/dL — AB (ref 70–99)
POTASSIUM: 3.8 meq/L (ref 3.7–5.3)
Sodium: 141 mEq/L (ref 137–147)

## 2014-06-01 LAB — CBC
HCT: 32.2 % — ABNORMAL LOW (ref 39.0–52.0)
HEMOGLOBIN: 10.6 g/dL — AB (ref 13.0–17.0)
MCH: 31.9 pg (ref 26.0–34.0)
MCHC: 32.9 g/dL (ref 30.0–36.0)
MCV: 97 fL (ref 78.0–100.0)
Platelets: 80 10*3/uL — ABNORMAL LOW (ref 150–400)
RBC: 3.32 MIL/uL — AB (ref 4.22–5.81)
RDW: 12.9 % (ref 11.5–15.5)
WBC: 6.4 10*3/uL (ref 4.0–10.5)
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
## 2014-06-01 NOTE — Clinical Social Work Note (Signed)
Per wife's request CSW came back to room at 3:00pm to update on available bed offers. No family in room. CSW will follow up with family as soon as possible to notify of bed offers.    Roddie McBryant Hermila Millis MSW, [place], [place], [phone]

## 2014-06-01 NOTE — Clinical Social Work Note (Signed)
CSW spoke with family by phone. They have asked for patient's [place] to be [place]. CSW has explained that the patient will likely be ready for DC tomorrow and patient will need to DC to a facility that has offered the patient a bed. [name] has informed the family that they will not be accepting the patient back.   [name] MSW, [place]CSWA, [place], [phone]

## 2014-06-01 NOTE — Progress Notes (Signed)
Per MD, okay to remove iv access. IV expired yesterday but pt d/c tomorrow.
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
## 2014-06-01 NOTE — Plan of Care (Signed)
Problem: Phase I Progression Outcomes Goal: Pain controlled with appropriate interventions Outcome: Completed/Met Date Met:  06/01/14 Goal: OOB as tolerated unless otherwise ordered Outcome: Completed/Met Date Met:  06/01/14 Goal: Voiding-avoid urinary catheter unless indicated Outcome: Completed/Met Date Met:  06/01/14 Goal: Hemodynamically stable Outcome: Progressing  Problem: Phase II Progression Outcomes Goal: Obtain order to discontinue catheter if appropriate Outcome: Not Applicable Date Met:  60/73/71  Problem: Phase III Progression Outcomes Goal: Pain controlled on oral analgesia Outcome: Completed/Met Date Met:  06/01/14 Goal: Voiding independently Outcome: Completed/Met Date Met:  06/01/14 Goal: Foley discontinued Outcome: Not Applicable Date Met:  01/15/93  Problem: Discharge Progression Outcomes Goal: Tolerating diet Outcome: Completed/Met Date Met:  06/01/14

## 2014-06-02 DIAGNOSIS — Z515 Encounter for palliative care: Secondary | ICD-10-CM | POA: Insufficient documentation

## 2014-06-02 LAB — CULTURE, BLOOD (ROUTINE X 2): Culture: NO GROWTH

## 2014-06-02 MED ORDER — ACETAMINOPHEN 325 MG PO TABS: 650.0000 mg | ORAL_TABLET | Freq: Three times a day (TID) | ORAL | Status: AC

## 2014-06-02 MED ORDER — WHITE PETROLATUM GEL
Status: AC
  Administered 2014-06-02: 0.2
  Filled 2014-06-02: qty 5

## 2014-06-02 MED ORDER — OXYCODONE HCL 5 MG/5ML PO SOLN: 5.0000 mg | Freq: Three times a day (TID) | ORAL | Status: AC

## 2014-06-02 MED ORDER — OXYCODONE HCL 5 MG/5ML PO SOLN: 5.0000 mg | ORAL | Status: AC | PRN

## 2014-06-02 MED ORDER — ACETAMINOPHEN 325 MG PO TABS: 650.0000 mg | ORAL_TABLET | Freq: Four times a day (QID) | ORAL | Status: AC | PRN

## 2014-06-02 MED ORDER — COLLAGENASE 250 UNIT/GM EX OINT
TOPICAL_OINTMENT | Freq: Every day | CUTANEOUS | Status: DC
  Administered 2014-06-02: 12:00:00 via TOPICAL
  Filled 2014-06-02: qty 30

## 2014-06-02 MED ORDER — TAMSULOSIN HCL 0.4 MG PO CAPS: 0.4000 mg | ORAL_CAPSULE | Freq: Every day | ORAL | Status: AC

## 2014-06-02 MED ORDER — LACTULOSE 10 GM/15ML PO SOLN: 30.0000 g | Freq: Every day | ORAL | Status: AC

## 2014-06-02 MED ORDER — HALOPERIDOL LACTATE 2 MG/ML PO CONC: 1.0000 mg | ORAL | Status: AC | PRN

## 2014-06-02 MED ORDER — SENNOSIDES-DOCUSATE SODIUM 8.6-50 MG PO TABS: 2.0000 | ORAL_TABLET | Freq: Every day | ORAL | Status: AC

## 2014-06-02 MED ORDER — ALBUTEROL SULFATE (2.5 MG/3ML) 0.083% IN NEBU: 2.5000 mg | INHALATION_SOLUTION | RESPIRATORY_TRACT | Status: AC | PRN

## 2014-06-02 MED ORDER — ENSURE COMPLETE PO LIQD: 237.0000 mL | Freq: Two times a day (BID) | ORAL | Status: AC

## 2014-06-02 NOTE — Progress Notes (Signed)
[facility]
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
## 2014-06-02 NOTE — Consult Note (Addendum)
WOC wound consult note Reason for Consult: Consult requested for buttocks and sacrum wounds.  Pt had stage 2 wounds which were present on admission to these areas.  [facility], wound has declined since that time. Pt is frequently incontinent of stool and it is difficult to keep wounds from becoming soiled since they are located close to rectum. Pt has other systemic factors which can impair healing and is immobile. Wound type: 3 areas of wounds; total area is 5X8 and there are 3 separate wounds divided by narrow bridges of intact skin. Right area of buttock is stage 2 and left area of buttock is stage 3, unstageable to sacrum 4X2 Pressure Ulcer POA: Yes Wound ZOX:[id number]:Unstageable area to sacrum is 100% tightly adhered yellow slough. Left buttock with deeper  skin loss, red and moist, right buttock with shallow skin loss, red and moist. Drainage (amount, consistency, odor) Small amt tan drainage, no odor Periwound: Intact skin surrounding wounds. Dressing procedure/placement/frequency: Santyl ointment to chemically debride nonviable tissue. Pt plans to transfer to SNF and they can continue this plan of care. Please re-consult if further assistance is needed.  Thank-you,  Cammie Mcgeeawn Priscila Bean MSN, RN, CWOCN, [place] [facility], CNS [phone]
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
## 2014-06-02 NOTE — Clinical Social Work Placement (Signed)
Clinical Social Work Department CLINICAL SOCIAL WORK PLACEMENT NOTE 06/02/2014  Patient:  Colton HerCOOK,[name]  Account Number:  [id number] Admit date:  05/27/2014  Clinical Social Worker:  Lavell LusterJOSEPH BRYANT Breely Panik, LCSWA  Date/time:  06/02/2014 03:16 PM  Clinical Social Work is seeking post-discharge placement for this patient at the following level of care:   SKILLED NURSING   (*CSW will update this form in Epic as items are completed)   06/01/2014  Patient/family provided with [facility] of Clinical Social Work's list of facilities offering this level of care within the geographic area requested by the patient (or if unable, by the patient's family).  06/01/2014  Patient/family informed of their freedom to choose among providers that offer the needed level of care, that participate in Medicare, Medicaid or managed care program needed by the patient, have an available bed and are willing to accept the patient.  06/01/2014  Patient/family informed of [facility]' ownership interest in [facility], as well as of the fact that they are under no obligation to receive care at this facility.  PASARR submitted to EDS on  PASARR number received on   FL2 transmitted to all facilities in geographic area requested by pt/family on  06/01/2014 FL2 transmitted to all facilities within larger geographic area on   Patient informed that his/[facility] managed care company has contracts with or will negotiate with  certain facilities, including the following:     Patient/family informed of bed offers received:  06/01/2014 Patient chooses bed at [facility] Physician recommends and patient chooses bed at    Patient to be [facility] on  06/02/2014 Patient to be transferred to facility by Ambulance Patient and family notified of transfer on 06/02/2014 Name of family member notified:  Marylene LandAngela and Okey Dupreose  The following physician request were  entered in Epic:   Additional Comments:   Per MD patient ready for DC to [facility]. RN, patient, patient's family, and facility notified of DC. RN given number for report. DC packet on chart. [facility] transport requested for patient (Service Request ID: [id number]). CSW had in depth conversation with Rose regarding long term plan for patient. CSW explained that [facility] will likely discontinue authorization for SNF placement for patient if he does not progress with physical therapy at SNF. CSW has encouraged Rose to apply for long term Medicaid as soon as possible so that patient will have a long term care payor source, as the family cannot afford this. CSW signing off.    Roddie McBryant Laramie Gelles MSW, [place]CSWA, [place], [phone]

## 2014-06-07 ENCOUNTER — Inpatient Hospital Stay: Payer: Self-pay | Admitting: Internal Medicine

## 2014-06-07 LAB — COMPREHENSIVE METABOLIC PANEL
ALBUMIN: 2.4 g/dL — AB (ref 3.4–5.0)
ALT: 57 U/L
AST: 117 U/L — AB (ref 15–37)
Alkaline Phosphatase: 115 U/L
Anion Gap: 6 — ABNORMAL LOW (ref 7–16)
BUN: 22 mg/dL — AB (ref 7–18)
Bilirubin,Total: 0.9 mg/dL (ref 0.2–1.0)
Calcium, Total: 8.1 mg/dL — ABNORMAL LOW (ref 8.5–10.1)
Chloride: 107 mmol/L (ref 98–107)
Co2: 28 mmol/L (ref 21–32)
Creatinine: 1.17 mg/dL (ref 0.60–1.30)
EGFR (Non-African Amer.): >60
GLUCOSE: 104 mg/dL — AB (ref 65–99)
Osmolality: 285 (ref 275–301)
POTASSIUM: 3.8 mmol/L (ref 3.5–5.1)
Sodium: 141 mmol/L (ref 136–145)
Total Protein: 6.4 g/dL (ref 6.4–8.2)

## 2014-06-07 LAB — URINALYSIS, COMPLETE
Bacteria: NONE SEEN
Bilirubin,UR: NEGATIVE
Glucose,UR: NEGATIVE mg/dL (ref 0–75)
Ketone: NEGATIVE
Leukocyte Esterase: NEGATIVE
NITRITE: NEGATIVE
PH: 5 (ref 4.5–8.0)
Protein: NEGATIVE
RBC,UR: 2 /HPF (ref 0–5)
SPECIFIC GRAVITY: 1.026 (ref 1.003–1.030)
Squamous Epithelial: <1

## 2014-06-07 LAB — CBC WITH DIFFERENTIAL/PLATELET
BASOS ABS: 0 10*3/uL (ref 0.0–0.1)
Basophil %: 0.3 %
EOS ABS: 0 10*3/uL (ref 0.0–0.7)
EOS PCT: 0.1 %
HCT: 30.1 % — AB (ref 40.0–52.0)
HGB: 9.9 g/dL — AB (ref 13.0–18.0)
Lymphocyte #: 0.3 10*3/uL — ABNORMAL LOW (ref 1.0–3.6)
Lymphocyte %: 2.4 %
MCH: 32.6 pg (ref 26.0–34.0)
MCHC: 32.8 g/dL (ref 32.0–36.0)
MCV: 99 fL (ref 80–100)
MONOS PCT: 8.1 %
Monocyte #: 1 x10 3/mm (ref 0.2–1.0)
NEUTROS ABS: 10.9 10*3/uL — AB (ref 1.4–6.5)
Neutrophil %: 89.1 %
Platelet: 133 10*3/uL — ABNORMAL LOW (ref 150–440)
RBC: 3.03 10*6/uL — ABNORMAL LOW (ref 4.40–5.90)
RDW: 13.6 % (ref 11.5–14.5)
WBC: 12.3 10*3/uL — AB (ref 3.8–10.6)

## 2014-06-07 LAB — TROPONIN I
Troponin-I: 0.03 ng/mL
Troponin-I: 0.03 ng/mL

## 2014-06-07 LAB — CK TOTAL AND CKMB (NOT AT ARMC)
CK, TOTAL: 3073 U/L — AB
CK-MB: 8 ng/mL — AB (ref 0.5–3.6)

## 2014-06-08 LAB — BASIC METABOLIC PANEL
ANION GAP: 5 — AB (ref 7–16)
BUN: 20 mg/dL — ABNORMAL HIGH (ref 7–18)
CO2: 30 mmol/L (ref 21–32)
Calcium, Total: 7.8 mg/dL — ABNORMAL LOW (ref 8.5–10.1)
Chloride: 105 mmol/L (ref 98–107)
Creatinine: 1.16 mg/dL (ref 0.60–1.30)
EGFR (Non-African Amer.): >60
GLUCOSE: 99 mg/dL (ref 65–99)
Osmolality: 282 (ref 275–301)
POTASSIUM: 4.7 mmol/L (ref 3.5–5.1)
SODIUM: 140 mmol/L (ref 136–145)

## 2014-06-08 LAB — CBC WITH DIFFERENTIAL/PLATELET
BASOS ABS: 0 10*3/uL (ref 0.0–0.1)
Basophil %: 0.2 %
Eosinophil #: 0 10*3/uL (ref 0.0–0.7)
Eosinophil %: 0.1 %
HCT: 28.1 % — AB (ref 40.0–52.0)
HGB: 9.1 g/dL — ABNORMAL LOW (ref 13.0–18.0)
LYMPHS ABS: 0.6 10*3/uL — AB (ref 1.0–3.6)
Lymphocyte %: 4.1 %
MCH: 32.4 pg (ref 26.0–34.0)
MCHC: 32.4 g/dL (ref 32.0–36.0)
MCV: 100 fL (ref 80–100)
Monocyte #: 1.1 x10 3/mm — ABNORMAL HIGH (ref 0.2–1.0)
Monocyte %: 7.8 %
NEUTROS PCT: 87.8 %
Neutrophil #: 12.9 10*3/uL — ABNORMAL HIGH (ref 1.4–6.5)
Platelet: 124 10*3/uL — ABNORMAL LOW (ref 150–440)
RBC: 2.8 10*6/uL — AB (ref 4.40–5.90)
RDW: 13.8 % (ref 11.5–14.5)
WBC: 14.7 10*3/uL — ABNORMAL HIGH (ref 3.8–10.6)

## 2014-06-08 LAB — PROTIME-INR
INR: 1.4
Prothrombin Time: 17.1 secs — ABNORMAL HIGH (ref 11.5–14.7)

## 2014-06-08 LAB — AMMONIA: Ammonia, Plasma: 35 mcmol/L — ABNORMAL HIGH (ref 11–32)

## 2014-06-08 LAB — TSH: Thyroid Stimulating Horm: 1.09 u[IU]/mL

## 2014-06-08 LAB — CK: CK, Total: 2668 U/L — ABNORMAL HIGH

## 2014-06-08 LAB — SEDIMENTATION RATE: Erythrocyte Sed Rate: 61 mm/hr — ABNORMAL HIGH (ref 0–20)

## 2014-06-08 LAB — APTT: Activated PTT: 31 secs (ref 23.6–35.9)

## 2014-06-09 LAB — CBC WITH DIFFERENTIAL/PLATELET
Basophil #: 0 10*3/uL (ref 0.0–0.1)
Basophil %: 0.4 %
Eosinophil #: 0.1 10*3/uL (ref 0.0–0.7)
Eosinophil %: 0.6 %
HCT: 26.4 % — ABNORMAL LOW (ref 40.0–52.0)
HGB: 8.7 g/dL — AB (ref 13.0–18.0)
Lymphocyte #: 0.6 10*3/uL — ABNORMAL LOW (ref 1.0–3.6)
Lymphocyte %: 4.9 %
MCH: 33 pg (ref 26.0–34.0)
MCHC: 33.1 g/dL (ref 32.0–36.0)
MCV: 100 fL (ref 80–100)
MONO ABS: 1 x10 3/mm (ref 0.2–1.0)
Monocyte %: 7.4 %
NEUTROS PCT: 86.7 %
Neutrophil #: 11.2 10*3/uL — ABNORMAL HIGH (ref 1.4–6.5)
Platelet: 72 10*3/uL — ABNORMAL LOW (ref 150–440)
RBC: 2.65 10*6/uL — ABNORMAL LOW (ref 4.40–5.90)
RDW: 13.7 % (ref 11.5–14.5)
WBC: 12.9 10*3/uL — ABNORMAL HIGH (ref 3.8–10.6)

## 2014-06-09 LAB — VANCOMYCIN, TROUGH: Vancomycin, Trough: 14 ug/mL (ref 10–20)

## 2014-06-09 LAB — CK: CK, Total: 1453 U/L — ABNORMAL HIGH

## 2014-06-10 LAB — URINE CULTURE

## 2014-06-11 LAB — CREATININE, SERUM
CREATININE: 1.05 mg/dL (ref 0.60–1.30)
EGFR (Non-African Amer.): >60

## 2014-06-11 LAB — CK: CK, Total: 455 U/L — ABNORMAL HIGH

## 2014-06-12 LAB — CULTURE, BLOOD (SINGLE)

## 2014-06-16 LAB — CULTURE, BLOOD (SINGLE)

## 2014-06-22 ENCOUNTER — Ambulatory Visit: Payer: Self-pay | Admitting: Internal Medicine

## 2014-08-23 DEATH — deceased

## 2014-11-13 NOTE — Discharge Summary (Signed)
PATIENT NAME:  [name], BRUBACHER MR#:  [id number] DATE OF BIRTH:  1933/07/02  DATE OF ADMISSION:  06/07/2014 DATE OF DISCHARGE:  06/12/2014  ADMITTING DIAGNOSIS: Altered mental status.   DISCHARGE DIAGNOSES:  1.  Bacteroides fragilis bacteremia.  2.  Severe rhabdomyolysis.  3.  Infected decubitus ulcer.  4.  Advanced dementia.  5.  Atrial fibrillation.  6.  Anemia of chronic disease.  7.  Failure to thrive with severe protein-calorie malnutrition.  8.  History of coronary artery disease.  9.  History of transient ischemic attack.   CONSULTATIONS:  1.  Richard E. Excell Seltzer, MD, surgery.  2.  Ned Grace, MD, palliative care.  3.  Mellody Drown, MD, neurology.  4.  Clydie Braun, MD, infectious disease.   PROCEDURES:  1.  CT of the head without contrast November 16th shows no acute intracranial or calvarial findings. Stable atrophy and chronic small vessel ischemic changes.  2.  Chest x-ray November 16th shows no active disease.  3.  MRI of brain without contrast November 18th showed negative for acute infarct. Chronic hemorrhagic infarction right anterior insula has been noted on prior MRI.   HISTORY OF PRESENT ILLNESS: This 81 year old African American man with history of advanced dementia who has had progressive decline over the past few months with several hospitalizations, Emergency Room visits and rehabilitation presents with fever and lethargy. Over this time, he has had about a 50-60 pound weight loss. He has also developed a sacral decubitus ulcer. In the Emergency Room, he is found to be febrile with a very elevated CPK.   HOSPITAL COURSE BY PROBLEM:  1.  Bacteroides bacteremia: This is likely the cause of his altered mental status. Infectious disease was consulted for recommendations. The source is most likely the infected decubitus ulcer. On presentation to the hospital, he was started on empiric meropenem and vancomycin. After culture results returned, at the recommendation of  infectious disease, antibiotics were changed to p.o. Augmentin. He will continue a 14 day course.  2.  Severe rhabdomyolysis: Initially thought to be due to possible neuroleptic malignant syndrome due to Haldol. Neurology was consulted. They did not feel that his altered mental status and rhabdomyolysis were likely due to NMS. Rather, it more likely that the rhabdomyolysis was due to alteration, immobility and muscle breakdown. Initial CPK was 3073, which had decreased at the time of discharge to 455.  3.  Infected decubitus ulcer: Surgery was consulted and no debridement or I and D was recommended. Continue with wound care. Continue with Augmentin.  4.  Advanced dementia: The patient was seen by neurology inpatient and he will continue with amantadine 100 mg every 12 hours.  5.  Atrial fibrillation: Stable throughout hospitalization.  6.  Anemia of chronic disease: Fairly stable throughout hospitalization with a baseline hemoglobin of about 9.  7.  Severe protein-calorie malnutrition and failure to thrive: This is thought to be the most likely due to advanced dementia. The patient was seen by palliative care in hospital. He is a do not resuscitate. He is being discharged with hospice care. His dementia has advanced to the point where he can no longer meet his own caloric needs. Family decided against PEG tube placement.   DISCHARGE PHYSICAL EXAMINATION:  VITAL SIGNS: Temperature 99.2, pulse 80, respirations 18, blood pressure 140/71, oxygenation 95% on room air.  GENERAL: No acute distress.  RESPIRATORY: Lungs are clear to auscultation bilaterally with good air movement. The patient did not participate fully in the examination.  CARDIOVASCULAR: He  has a 3/6 systolic ejection murmur, irregular rhythm, rate controlled. No peripheral edema. Peripheral pulses are 1+.  NEUROLOGIC: The patient is not oriented to place, date or circumstance. He is able to sit up and feed himself. Strength and sensation seem  to be intact. Nonfocal neurologic examination.   DISCHARGE LABORATORY DATA: Sodium 140, potassium 4.7, chloride 105, bicarbonate 30, creatinine 1.16, BUN 20, glucose 99, CK 455. TSH 1.09. White blood cells 12.9, hemoglobin 8.7, platelets 72,000, MCV 100.   DISCHARGE MEDICATIONS:  1.  Albuterol 2.5 mg inhalation solution 3 mL inhaled every 2 hours as needed for wheezing.  2.  Amiodarone 200 mg 1 tablet once a day. 3.  Lactulose 10 grams/15 mL, 45 mL once a day.  4.  Oxycodone 5 mg/5 mL oral solution, 5 mL orally 3 times a day.  5.  Santyl 250 units/g topical ointment apply to sacrum once a day for wound care.  6.  Senokot S 50 mg/8.6 mg 2 tablets orally once a day at bedtime.  7.  Tamsulosin 0.4 mg 1 capsule orally once a day at bedtime.  8.  Oxycodone 5 mg/5 mL oral solution every 4 hours as needed for pain.  9.  Amantadine 50 mg/5 mL oral syrup, 10 mL orally every 12 hours.  10.  Lorazepam 0.5 mg every 6 hours as needed for agitation.  11.  Amoxicillin-clavulanate 875 mg orally 2 times a day to complete 14 day course and then stop.   CONDITION ON DISCHARGE: The patient is stable at this time. He does have multiple chronic and severe medical conditions and his prognosis is generally poor. He is being discharged home with hospice care.   DISCHARGE INSTRUCTIONS:  DIET: Regular diet, pureed nectar thick liquids with strict aspiration precautions, medications and pureed feeding assistance is needed at all meals. Ensure Plus is needed to add calories and nutrition.  ACTIVITY: As tolerated.  TIMEFRAME FOR APPOINTMENT: The patient will be followed by hospice in home.   TIME SPENT ON DISCHARGE: Forty-five minutes.    ____________________________ Ena Dawleyatherine P. Clent RidgesWalsh, MD cpw:TT D: 06/14/2014 21:13:15 ET T: 06/14/2014 21:52:36 ET JOB#: [id number]  cc: Santina Evansatherine P. Clent RidgesWalsh, MD, <Dictator> Gale JourneyATHERINE P WALSH MD ELECTRONICALLY SIGNED 06/26/2014 11:47
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
## 2014-11-13 NOTE — Consult Note (Signed)
Brief Consult Note: Diagnosis: Anaerobic sepsis with bacteroides bacteremia, likely source infected sacral wound.   Patient was seen by consultant.   Consult note dictated.   Recommend further assessment or treatment.   Orders entered.   Comments: Will need wound care recs and consult to manage sacral wound Can change to oral augmetin for 14 day course to cover the bacteremia and infected wound initially Will be difficult to heal wound given immobility, dementia and malnutrition.  Agree with hospice.  Electronic Signatures: Dierdre HarnessFitzgerald, David Patrick (MD)  (Signed [phone]-Nov-15 16:03)  Authored: Brief Consult Note   Last Updated: 20-Nov-15 16:03 by Dierdre HarnessFitzgerald, David Patrick (MD)
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
## 2014-11-17 NOTE — Consult Note (Signed)
PATIENT NAME:  [name], [name] MR#:  [id number] DATE OF BIRTH:  02-21-1933  DATE OF CONSULTATION:  06/11/2014  REFERRING PHYSICIAN:   CONSULTING PHYSICIAN:  Stann Mainland. Sampson Goon, MD  REASON FOR CONSULTATION: Bacteroides bacteremia.   HISTORY OF PRESENT ILLNESS: This is a 81 year old gentleman with advanced dementia who was admitted 11/16. He was admitted with some altered mental status as well as a decubitus ulcer and agitation. He has had a progressive downhill course with impressive weight loss, in and out of hospitals and rehabilitation. In the ED, he was found to have a fever and elevated CPK and was admitted.   PAST MEDICAL HISTORY:  1.  Sacral decubitus.  2.  Dementia.  3.  Coronary artery disease.  4.  Acute renal failure on recent admission.  5.  Atrial fibrillation.  6.  History of prostate cancer.  7.  Arthritis.  8.  TIA.   PAST SURGICAL HISTORY: Neck surgery, prostate surgery, left knee surgery.   ALLERGIES: LISINOPRIL.   ANTIBIOTICS SINCE ADMISSION: Include vancomycin and meropenem.   REVIEW OF SYSTEMS: Unable to be obtained.   FAMILY HISTORY: Noncontributory.   PHYSICAL EXAMINATION: VITAL SIGNS: The patient had a temperature 100.9, on November 19th, since then he has been afebrile, current temperature 99.6, pulse 79, blood pressure 121/64, respirations 18, saturation 100% on room air.  GENERAL: He is lying in bed, picking at the sheets; he is very restless; he has advanced dementia. He does not really follow commands.  HEENT: His oropharynx is dry.  NECK: Supple.  HEART: Regular.  LUNGS: Clear.  ABDOMEN: Soft, nontender.  EXTREMITIES: No clubbing, cyanosis, or edema.  SKIN: He has a large sacral decubitus ulcer that does not have a lot of undermining. There is no exposed bone or deep soft tissue. There is some odor and drainage from it; however.  NEUROLOGIC: He has advanced dementia.   DATA: Blood cultures done November 16th, two of two, grew Bacteroides fragilis  in 2 bottles. Urine culture November 16th, had 10,000 pseudomonas sensitive to Cipro; repeat blood cultures from November 20th, are no growth to date; white blood count on admission 12.3, currently 12.9, hemoglobin 8.7, platelets 72,000. Urinalysis on admission had only 1 white cell; the renal function shows a creatinine of 1.16, LFTs are normal except albumin low at 2.4, and a mildly elevated AST at 117; CPK was elevated at 3000.   IMAGING: MRI of the brain November 18th, showed negative for acute infarct, chest x-ray November 16th, no active disease.   IMPRESSION:  54.  A 81 year old gentleman with advanced dementia admitted with a sacral decubitus ulcer as well as Bacteroides bacteremia. The source of the bacteremia is likely the decubitus ulcer. This is a relatively early ulcer but it does appear infected. I agree with surgery, that no debridement is needed at this time. For his urinary culture, he only had 10,000 colony-forming units and his urinalysis was negative, so I do not think he has an active urinary tract infection and does not need treatment for this; recommendation, would change antibiotics to Augmentin, for at least a 10 to 14 day course. This will cover usual pathogens but will not cover MRSA, but we have not identified that.  2.  This patient has had a marked decline and has advanced dementia. Palliative care would be very appropriate.   Thank you for the consult. We will be glad to follow with you.    ____________________________ Stann Mainland. Sampson Goon, MD dpf:nt D: 06/12/2014 10:05:00 ET T: 06/12/2014  11:10:03 ET JOB#: [id number]  cc: Stann Mainlandavid P. Sampson GoonFitzgerald, MD, <Dictator> Lakishia Bourassa Sampson GoonFITZGERALD MD ELECTRONICALLY SIGNED 07/25/2014 21:16

## 2015-07-28 IMAGING — CR DG CHEST 1V PORT
1 series · 1 of 1 positions shown · non-contrast
Comparison: 05/27/2014

CLINICAL DATA: Fever in difficulty breathing

EXAM:
PORTABLE CHEST - 1 VIEW

[dxr portable chest single view]
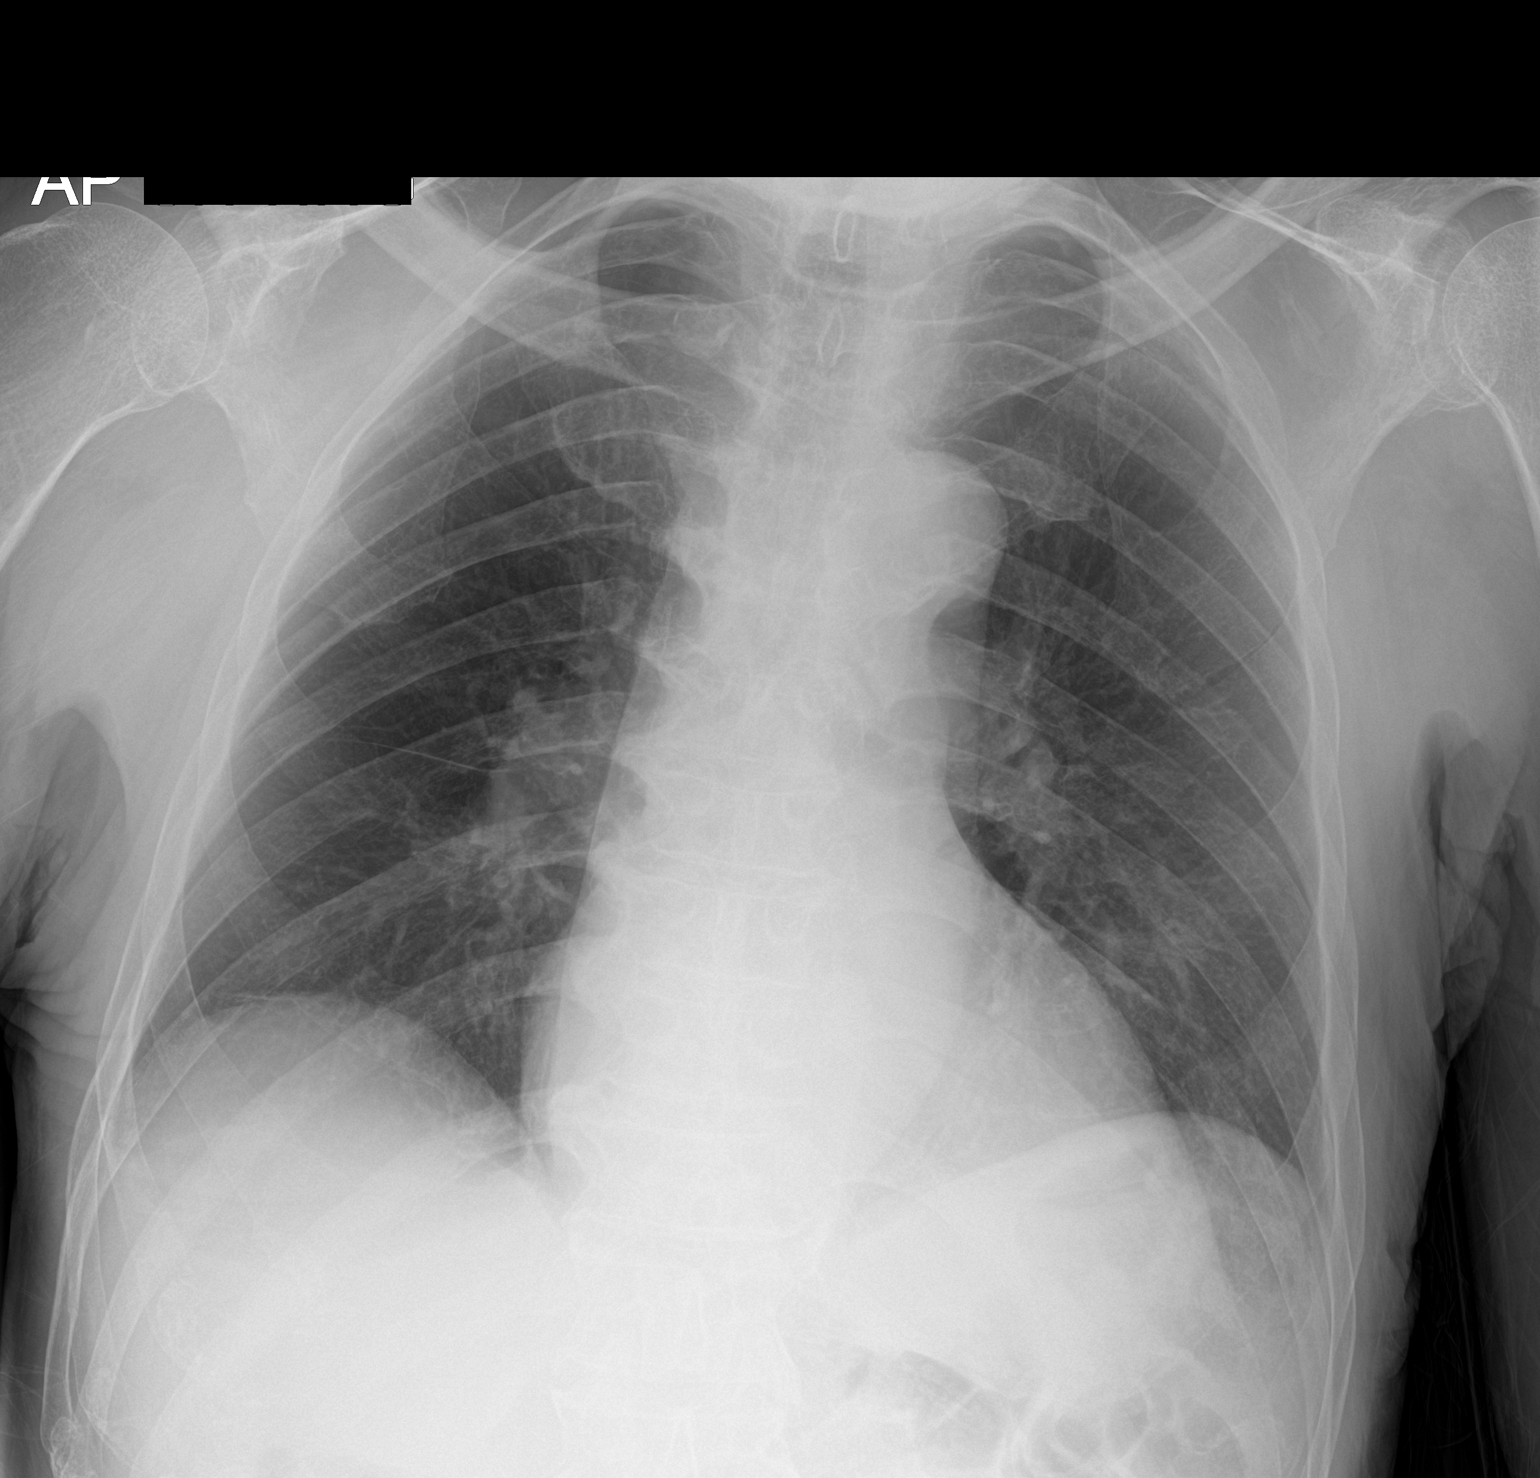

[1 of 1 positions shown; findings below may reference images not displayed]

FINDINGS: There is mild cardiac enlargement. No pleural effusion or edema. No
airspace consolidation identified.
IMPRESSION: No active disease.-

## 2020-12-21 DEATH — deceased
